# Patient Record
Sex: Female | Born: 1995 | Race: Black or African American | Hispanic: No | Marital: Single | Smoking: Never smoker
Health system: Southern US, Community
[De-identification: ages and names within clinical notes are randomized; demographics above are authoritative.]

## PROBLEM LIST (undated history)

## (undated) ENCOUNTER — Inpatient Hospital Stay (HOSPITAL_COMMUNITY): Payer: Self-pay

## (undated) DIAGNOSIS — A3790 Whooping cough, unspecified species without pneumonia: Secondary | ICD-10-CM

## (undated) DIAGNOSIS — N390 Urinary tract infection, site not specified: Secondary | ICD-10-CM

## (undated) DIAGNOSIS — E039 Hypothyroidism, unspecified: Secondary | ICD-10-CM

## (undated) DIAGNOSIS — N926 Irregular menstruation, unspecified: Secondary | ICD-10-CM

## (undated) DIAGNOSIS — D649 Anemia, unspecified: Secondary | ICD-10-CM

## (undated) HISTORY — DX: Whooping cough, unspecified species without pneumonia: A37.90

## (undated) HISTORY — PX: NO PAST SURGERIES: SHX2092

---
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
## 2015-01-06 ENCOUNTER — Telehealth: Payer: Self-pay | Admitting: Family Medicine

## 2015-01-06 NOTE — Telephone Encounter (Signed)
Spoke with patient regarding urine symptoms.  Advised patient that she should be seen to make sure she did not have a UTI.  Offered an appointment for tomorrow, but declined, patient is going out of town early in the morning.  No appointments at [facility] today.  Advised patient to go to one of the urgent care center for evaluation.  Patient stated understanding.  Clovis Pu, RN

## 2015-01-06 NOTE — Telephone Encounter (Signed)
Urine is very dark and the odor is strong, pt drinks nothing but water everyday. Patient is pregnant and would like to know if she should come in for this or not. Erin Wilkins, ASA

## 2015-01-12 ENCOUNTER — Encounter: Payer: Self-pay | Admitting: Family Medicine

## 2015-01-14 ENCOUNTER — Other Ambulatory Visit (HOSPITAL_COMMUNITY)
Admission: RE | Admit: 2015-01-14 | Discharge: 2015-01-14 | Disposition: A | Discharge status: Home / Self Care | Payer: Medicaid Other | Source: Ambulatory Visit | Attending: Family Medicine | Admitting: Family Medicine

## 2015-01-14 ENCOUNTER — Encounter: Payer: Self-pay | Admitting: Internal Medicine

## 2015-01-14 ENCOUNTER — Ambulatory Visit (INDEPENDENT_AMBULATORY_CARE_PROVIDER_SITE_OTHER): Payer: Medicaid Other | Admitting: Internal Medicine

## 2015-01-14 VITALS — BP 107/72 | HR 113 | Temp 98.4°F | Wt 115.4 lb

## 2015-01-14 DIAGNOSIS — N342 Other urethritis: Secondary | ICD-10-CM | POA: Diagnosis not present

## 2015-01-14 DIAGNOSIS — N390 Urinary tract infection, site not specified: Secondary | ICD-10-CM | POA: Diagnosis not present

## 2015-01-14 DIAGNOSIS — Z113 Encounter for screening for infections with a predominantly sexual mode of transmission: Secondary | ICD-10-CM | POA: Insufficient documentation

## 2015-01-14 DIAGNOSIS — Z331 Pregnant state, incidental: Secondary | ICD-10-CM | POA: Diagnosis not present

## 2015-01-14 DIAGNOSIS — Z3490 Encounter for supervision of normal pregnancy, unspecified, unspecified trimester: Secondary | ICD-10-CM

## 2015-01-14 LAB — POCT URINALYSIS DIPSTICK
Bilirubin, UA: NEGATIVE
Glucose, UA: NEGATIVE
Ketones, UA: NEGATIVE
NITRITE UA: NEGATIVE
PH UA: 7
PROTEIN UA: NEGATIVE
RBC UA: NEGATIVE
Spec Grav, UA: 1.025
UROBILINOGEN UA: 0.2

## 2015-01-14 LAB — POCT UA - MICROSCOPIC ONLY

## 2015-01-14 MED ORDER — FOSFOMYCIN TROMETHAMINE 3 G PO PACK: 3.0000 g | PACK | Freq: Once | ORAL | Status: DC

## 2015-01-14 NOTE — Progress Notes (Signed)
Patient ID: Erin Wilkins, female   DOB: Apr 11, 1996, 19 y.o.   MRN: [id number]     [name] Noralee Chars, MD Phone: [phone]  Subjective:   # Patient 31 weeks and 6 days, called on 9/1 with UTI complaints, at that time refused appointment and did not go to urgent care as recommended. Has continued to have symptoms over the past week. Feels that her bladder feels similar to her past UTIs. No dysuria. Dark urine, does not smell. Unsure about change in frequency, definitely feels like increased urgency. Has noted having flanking pain for about 3 weeks, however not associated with the change in urination. No fever, chills, nausea, or vomiting. No vaginal irritation. Sexually active,with one partner, would be amenable for G/C testing. Patient has had amoxicillin/pencilin in past causing hives of the face.  All relevant systems were reviewed and were negative unless otherwise noted in the HPI  Past Medical History Reviewed problem list.  Medications- reviewed and updated Current Outpatient Prescriptions  Medication Sig Dispense Refill  . carboxymethylcellulose (LUBRICATING PLUS EYE DROPS) 0.5 % SOLN Place 1 drop into the left eye 3 (three) times daily as needed. 15 mL 0  . fosfomycin (MONUROL) 3 G PACK Take 3 g by mouth once. 3 g 0  . olopatadine (PATANOL) 0.1 % ophthalmic solution Place 1 drop into the left eye 2 (two) times daily. 5 mL 0  . Prenatal Vit-Fe Fumarate-FA (PRENATAL VITAMIN) 27-0.8 MG TABS Take 1 tablet by mouth daily. 30 tablet 11  . Prenatal Vit-Min-FA-Fish Oil ([facility] PRENATAL GUMMY) 0.4-113.5 MG CHEW Chew 1 tablet by mouth daily. 30 tablet 11  . terconazole (TERAZOL 7) 0.4 % vaginal cream Place 1 applicator vaginally at bedtime. For 7 days. 45 g 0  . trimethoprim-polymyxin b (POLYTRIM) ophthalmic solution Place 1 drop into both eyes every 4 (four) hours. x5 days 10 mL 0  . [DISCONTINUED] clindamycin (CLINDAGEL) 1 % gel APPLY TO FACE DAILY AS DIRECTED 60 g 2    No current facility-administered medications for this visit.   Chief complaint-noted No additions to family history Social history- patient is a non-smoker  Objective: BP 107/72 mmHg  Pulse 113  Temp(Src) 98.4 F (36.9 C) (Oral)  Wt 115 lb 6 oz (52.334 kg)  LMP 05/31/2014 (Approximate) Gen: NAD, alert, cooperative with exam, not ill appearing  HEENT: NCAT, EOMI, PERRL, TMs nml Neck: FROM, supple CV: RRR, good S1/S2, no murmur, cap refill <3 Resp: CTABL, no wheezes, non-labored Abd: [place] female, non-tender to palpation of suprapubic area, no other abdominal tenderness. No CVA tenderness.  Neuro: Alert and oriented, No gross defici ts Skin: no rashes no lesions  Assessment/Plan: See problem based a/p  UTI in pregnancy. No dysuria, urine darkened in color despite hydration, increased urgency. Unlikely pyelonephritis negative fever, chills, n/v or CVA tenderness.  - UA with micr and urine culture  - Treat with fosfomycin x 1, received this in the past.  - Urine G/C  - Mom wanting patient to get another U/S, discussed concerns with mom and when we would typically consider getting another U/S

## 2015-01-14 NOTE — Addendum Note (Signed)
Addended by: Noralee Chars Z on: 01/14/2015 01:37 PM   Modules accepted: Orders

## 2015-01-14 NOTE — Assessment & Plan Note (Signed)
UTI in pregnancy. No dysuria, urine darkened in color despite hydration, increased urgency. Unlikely pyelonephritis negative fever, chills, n/v or CVA tenderness.  - UA with micr and urine culture  - Treat with fosfomycin x 1, received this in the past.  - Urine G/C  - Mom wanting patient to get another U/S, discussed concerns with mom and when we would typically consider getting another U/S

## 2015-01-14 NOTE — Patient Instructions (Addendum)
Patient provided with fosfomycin x 1 for UTI. Please return as discussed below   Pregnancy and Urinary Tract Infection A urinary tract infection (UTI) is a bacterial infection of the urinary tract. Infection of the urinary tract can include the ureters, kidneys (pyelonephritis), bladder (cystitis), and urethra (urethritis). All pregnant women should be screened for bacteria in the urinary tract. Identifying and treating a UTI will decrease the risk of preterm labor and developing more serious infections in both the mother and baby. CAUSES Bacteria germs cause almost all UTIs.  RISK FACTORS Many factors can increase your chances of getting a UTI during pregnancy. These include:  Having a short urethra.  Poor toilet and hygiene habits.  Sexual intercourse.  Blockage of urine along the urinary tract.  Problems with the pelvic muscles or nerves.  Diabetes.  Obesity.  Bladder problems after having several children.  Previous history of UTI. SIGNS AND SYMPTOMS   Pain, burning, or a stinging feeling when urinating.  Suddenly feeling the need to urinate right away (urgency).  Loss of bladder control (urinary incontinence).  Frequent urination, more than is common with pregnancy.  Lower abdominal or back discomfort.  Cloudy urine.  Blood in the urine (hematuria).  Fever. When the kidneys are infected, the symptoms may be:  Back pain.  Flank pain on the right side more so than the left.  Fever.  Chills.  Nausea.  Vomiting. DIAGNOSIS  A urinary tract infection is usually diagnosed through urine tests. Additional tests and procedures are sometimes done. These may include:  Ultrasound exam of the kidneys, ureters, bladder, and urethra.  Looking in the bladder with a lighted tube (cystoscopy). TREATMENT Typically, UTIs can be treated with antibiotic medicines.  HOME CARE INSTRUCTIONS   Only take over-the-counter or prescription medicines as directed by your health  care provider. If you were prescribed antibiotics, take them as directed. Finish them even if you start to feel better.  Drink enough fluids to keep your urine clear or pale yellow.  Do not have sexual intercourse until the infection is gone and your health care provider says it is okay.  Make sure you are tested for UTIs throughout your pregnancy. These infections often come back. Preventing a UTI in the Future  Practice good toilet habits. Always wipe from front to back. Use the tissue only once.  Do not hold your urine. Empty your bladder as soon as possible when the urge comes.  Do not douche or use deodorant sprays.  Wash with soap and warm water around the genital area and the anus.  Empty your bladder before and after sexual intercourse.  Wear underwear with a cotton crotch.  Avoid caffeine and carbonated drinks. They can irritate the bladder.  Drink cranberry juice or take cranberry pills. This may decrease the risk of getting a UTI.  Do not drink alcohol.  Keep all your appointments and tests as scheduled. SEEK MEDICAL CARE IF:   Your symptoms get worse.  You are still having fevers 2 or more days after treatment begins.  You have a rash.  You feel that you are having problems with medicines prescribed.  You have abnormal vaginal discharge. SEEK IMMEDIATE MEDICAL CARE IF:   You have back or flank pain.  You have chills.  You have blood in your urine.  You have nausea and vomiting.  You have contractions of your uterus.  You have a gush of fluid from the vagina. MAKE SURE YOU:  Understand these instructions.   Will  watch your condition.   Will get help right away if you are not doing well or get worse.  Document Released: 08/18/2010 Document Revised: 02/11/2013 Document Reviewed: 11/20/2012 [facility] Patient Information 2015 [place], [place]. This information is not intended to replace advice given to you by your health care provider. Make sure  you discuss any questions you have with your health care provider.

## 2015-01-17 NOTE — Addendum Note (Signed)
Addended by: Jennette Bill on: 01/17/2015 02:05 PM   Modules accepted: Orders

## 2015-01-18 LAB — URINE CYTOLOGY ANCILLARY ONLY
CHLAMYDIA, DNA PROBE: NEGATIVE
Neisseria Gonorrhea: NEGATIVE

## 2015-01-19 ENCOUNTER — Encounter: Payer: Self-pay | Admitting: Internal Medicine

## 2015-01-27 ENCOUNTER — Encounter (HOSPITAL_COMMUNITY): Payer: Self-pay | Admitting: *Deleted

## 2015-01-27 ENCOUNTER — Ambulatory Visit (INDEPENDENT_AMBULATORY_CARE_PROVIDER_SITE_OTHER): Payer: Medicaid Other | Admitting: Family Medicine

## 2015-01-27 ENCOUNTER — Inpatient Hospital Stay (HOSPITAL_COMMUNITY)
Admission: AD | Admit: 2015-01-27 | Discharge: 2015-01-27 | Disposition: A | Discharge status: Home / Self Care | Payer: Medicaid Other | Source: Ambulatory Visit | Attending: Obstetrics & Gynecology | Admitting: Obstetrics & Gynecology

## 2015-01-27 VITALS — BP 111/73 | HR 86 | Temp 98.5°F | Wt 118.5 lb

## 2015-01-27 DIAGNOSIS — R202 Paresthesia of skin: Secondary | ICD-10-CM | POA: Diagnosis not present

## 2015-01-27 DIAGNOSIS — O26893 Other specified pregnancy related conditions, third trimester: Secondary | ICD-10-CM | POA: Diagnosis not present

## 2015-01-27 DIAGNOSIS — R42 Dizziness and giddiness: Secondary | ICD-10-CM | POA: Diagnosis not present

## 2015-01-27 DIAGNOSIS — Z3A33 33 weeks gestation of pregnancy: Secondary | ICD-10-CM | POA: Insufficient documentation

## 2015-01-27 DIAGNOSIS — R55 Syncope and collapse: Secondary | ICD-10-CM | POA: Diagnosis present

## 2015-01-27 LAB — URINALYSIS, ROUTINE W REFLEX MICROSCOPIC
Bilirubin Urine: NEGATIVE
GLUCOSE, UA: NEGATIVE mg/dL
HGB URINE DIPSTICK: NEGATIVE
Ketones, ur: 15 mg/dL — AB
Nitrite: NEGATIVE
PH: 6.5 (ref 5.0–8.0)
Protein, ur: NEGATIVE mg/dL
SPECIFIC GRAVITY, URINE: 1.02 (ref 1.005–1.030)
Urobilinogen, UA: 1 mg/dL (ref 0.0–1.0)

## 2015-01-27 LAB — BASIC METABOLIC PANEL
Anion gap: 5 (ref 5–15)
BUN: 11 mg/dL (ref 6–20)
CHLORIDE: 108 mmol/L (ref 101–111)
CO2: 22 mmol/L (ref 22–32)
CREATININE: 0.43 mg/dL — AB (ref 0.44–1.00)
Calcium: 8.4 mg/dL — ABNORMAL LOW (ref 8.9–10.3)
GFR calc Af Amer: >60 mL/min (ref >60)
GFR calc non Af Amer: >60 mL/min (ref >60)
GLUCOSE: 91 mg/dL (ref 65–99)
POTASSIUM: 3.5 mmol/L (ref 3.5–5.1)
Sodium: 135 mmol/L (ref 135–145)

## 2015-01-27 LAB — URINE MICROSCOPIC-ADD ON

## 2015-01-27 LAB — CBC
HCT: 25.2 % — ABNORMAL LOW (ref 36.0–46.0)
Hemoglobin: 8.5 g/dL — ABNORMAL LOW (ref 12.0–15.0)
MCH: 30 pg (ref 26.0–34.0)
MCHC: 33.7 g/dL (ref 30.0–36.0)
MCV: 89 fL (ref 78.0–100.0)
PLATELETS: 254 10*3/uL (ref 150–400)
RBC: 2.83 MIL/uL — ABNORMAL LOW (ref 3.87–5.11)
RDW: 14.8 % (ref 11.5–15.5)
WBC: 9.6 10*3/uL (ref 4.0–10.5)

## 2015-01-27 LAB — POCT HEMOGLOBIN: Hemoglobin: 7.8 g/dL — AB (ref 12.2–16.2)

## 2015-01-27 MED ORDER — FERROUS SULFATE 325 (65 FE) MG PO TABS: 325.0000 mg | ORAL_TABLET | Freq: Every day | ORAL | Status: DC

## 2015-01-27 MED ORDER — DOCUSATE SODIUM 100 MG PO CAPS: 100.0000 mg | ORAL_CAPSULE | Freq: Two times a day (BID) | ORAL | Status: DC

## 2015-01-27 NOTE — Progress Notes (Signed)
Erin Wilkins is a 19 y.o. G1P0 at [redacted]w[redacted]d for routine follow up.  Pre-syncope: She reports feeling faint two days ago. Felt really hot, then felt like she needed to sit down. Similar episodes have happened 4 times over the past couple weeks. Has not passed out. No palpitations. No chest pain or shortness of breath. Endorses drinking plenty of fluid. Occurs more when hot outside.   See flow sheet for details.  A/P: Pregnancy at [redacted]w[redacted]d.  Doing well.   Pregnancy issues include dizziness.  Infant feeding choice: Wants to do both.  Contraception choice: Wants to try depo Infant circumcision desired yes  Tdapwas not given today. GBS/GC/CZ testing was not performed today.  Preterm labor precautions reviewed. Safe sleep discussed. Kick counts reviewed. Follow up 2 weeks.  Pre-syncope:  Likely a combination of dehydration and anemia. Also possibly has a vaso-vagal component given preceding symptoms. POCT Hgb in office today 7.0. Additionally patient with positive orthostatic vital signs, and thus likely hemoconcentrated. Will send to MAU for CBC, BMET, and possibly administration of fluids and/or transfusion if needed based on CBC result.

## 2015-01-27 NOTE — MAU Provider Note (Signed)
History     CSN: [id number]  Arrival date and time: 01/27/15 1249   First Kyen Taite Initiated Contact with Patient 01/27/15 1318      Chief Complaint  Patient presents with  . Near Syncope   HPI  Patient is 19 y.o. G1P0 [redacted]w[redacted]d here per MD recommendation for Hgb 7.  Erin Wilkins was seen earlier today at [facility] for a regular prenatal visit, and was found to have Hgb of 7. She is not reporting dizziness or other symptoms today, but does report dizziness when standing two days ago. She had a similar episode two weeks ago. She reports that she was told she was dehydrated while at clinic today, and says she has only had one glass of water today, and two small bottles of water yesterday.  Denies hematuria, hematochezia, nosebleeds, vaginal bleeding. Denies dysuria or increased urinary frequency.  +FM, denies LOF, contractions, vaginal discharge.   Past Medical History  Diagnosis Date  . UTI (urinary tract infection)   . Pertussis 03/18/2012    History reviewed. No pertinent past surgical history.  History reviewed. No pertinent family history.  Social History  Substance Use Topics  . Smoking status: Never Smoker   . Smokeless tobacco: None  . Alcohol Use: No    Allergies:  Allergies  Allergen Reactions  . Amoxicillin Rash  . Penicillins Rash    Has patient had a PCN reaction causing immediate rash, facial/tongue/throat swelling, SOB or lightheadedness with hypotension: No Has patient had a PCN reaction causing severe rash involving mucus membranes or skin necrosis: No Has patient had a PCN reaction that required hospitalization No Has patient had a PCN reaction occurring within the last 10 years: No If all of the above answers are "NO", then may proceed with Cephalosporin use.     Prescriptions prior to admission  Medication Sig Dispense Refill Last Dose  . Prenatal Vit-Min-FA-Fish Oil ([facility] PRENATAL GUMMY) 0.4-113.5 MG CHEW Chew 1 tablet by mouth daily. 30 tablet 11 01/26/2015  at Unknown time  . carboxymethylcellulose (LUBRICATING PLUS EYE DROPS) 0.5 % SOLN Place 1 drop into the left eye 3 (three) times daily as needed. (Patient not taking: Reported on 01/27/2015) 15 mL 0   . fosfomycin (MONUROL) 3 G PACK Take 3 g by mouth once. (Patient not taking: Reported on 01/27/2015) 3 g 0   . olopatadine (PATANOL) 0.1 % ophthalmic solution Place 1 drop into the left eye 2 (two) times daily. (Patient not taking: Reported on 01/27/2015) 5 mL 0   . Prenatal Vit-Fe Fumarate-FA (PRENATAL VITAMIN) 27-0.8 MG TABS Take 1 tablet by mouth daily. (Patient not taking: Reported on 01/27/2015) 30 tablet 11 Taking  . terconazole (TERAZOL 7) 0.4 % vaginal cream Place 1 applicator vaginally at bedtime. For 7 days. (Patient not taking: Reported on 01/27/2015) 45 g 0   . trimethoprim-polymyxin b (POLYTRIM) ophthalmic solution Place 1 drop into both eyes every 4 (four) hours. x5 days (Patient not taking: Reported on 01/27/2015) 10 mL 0     Review of Systems  HENT: Negative for nosebleeds.   Gastrointestinal: Negative for blood in stool.  Genitourinary: Negative for hematuria.  Neurological: Positive for dizziness and tingling.   Physical Exam   Blood pressure 116/65, pulse 98, temperature 98.8 F (37.1 C), temperature source Oral, resp. rate 16, height 5' 1.61" (1.565 m), weight 120 lb 9.6 oz (54.704 kg), last menstrual period 05/31/2014, SpO2 100 %.  Physical Exam  Nursing note and vitals reviewed. Constitutional: She is oriented to person,  place, and time. She appears well-developed and well-nourished. No distress.  HENT:  Head: Normocephalic and atraumatic.  Eyes:  No conjunctival pallor bilaterally  Cardiovascular: Normal rate.   Respiratory: Effort normal. No respiratory distress.  GI: Soft. She exhibits no distension. There is no tenderness. There is no rebound and no guarding.  Gravid   Musculoskeletal: She exhibits no edema or tenderness.  Neurological: She is alert and oriented to  person, place, and time. No cranial nerve deficit.  Skin: Skin is warm and dry.  Psychiatric: She has a normal mood and affect. Her behavior is normal.    MAU Course  Procedures None  MDM CBC - Hgb 8.5 UA - few bacteria BMP - unremarkable  Assessment and Plan  A: Patient is 19 y.o. G1P0 [redacted]w[redacted]d here per MD recommendation for Hgb 7. Given repeat Hgb of 8.5 and lack of symptoms, will not admit today.   P: Discharge home - Reviewed findings and my conclusion - Handout given - Follow-up with OB Erin Wilkins - Prescribed iron and Colace - Will sent urine for culture given presence of bacteria. Will contact patient if need for treatment.    Tarri Abernethy, MD PGY-1 [name]  01/27/2015, 3:07 PM

## 2015-01-27 NOTE — MAU Note (Signed)
Patient was seen at [facility] and states Hgb was 7.  She states she had felt a little lightheaded a few days ago.  Patient was sent over to MAU for blood work and possible IV iron infusion.

## 2015-01-27 NOTE — Discharge Instructions (Signed)
Premature Rupture and Preterm Premature Rupture of Membranes A sac made up of membranes surrounds your baby in the womb (uterus). When this sac breaks before contractions or labor starts, it is called premature rupture of membranes (PROM). Rupture of membranes is also known as your water breaking. If this happens before 37 weeks, it is called preterm premature rupture of membranes (PPROM). PPROM is serious. It needs medical care right away. CAUSES  PROM may be caused by the membranes getting weak. This happens at the end of pregnancy. PPROM is often due to an infection, but can be caused by a number of other things.  SIGNS OF PROM OR PPROM  A sudden gush of fluid from the vagina.  A slow leak of fluid from the vagina.  Your underwear stay wet. WHAT TO DO IF YOU THINK YOUR WATER BROKE Call your doctor right away. You will need to go to the hospital to get checked right away. WHAT HAPPENS IF YOU ARE TOLD YOU HAVE PROM OR PPROM? You will have tests done at the hospital. If you have PROM, you may be given medicine to start labor (induced). This may happen if you are not having contractions within 24 hours of your water breaking. If you have PPROM and are not having contractions, you may be given medicine to start labor. It will depend on how far along you are in your pregnancy. If you have PPROM, you:  And your baby will be watched closely for signs of infection or other problems.  May be given an antibiotic medicine. This can stop an infection from starting.  May be given a steroid medicine. This can help the lungs to develop faster.  May be given a medicine to stop early labor (preterm labor).  May be told to stay in bed except to use the restroom (bed rest).  May be given medicine to start labor. This can happen if there are problems with you or the baby. Your treatment will depend on many factors. Document Released: 07/20/2008 Document Revised: 12/24/2012 Document Reviewed:  08/12/2012 [facility] Patient Information 2015 [place], [place]. This information is not intended to replace advice given to you by your health care provider. Make sure you discuss any questions you have with your health care provider. Pregnancy and Anemia Anemia is a condition in which the concentration of red blood cells or hemoglobin in the blood is below normal. Hemoglobin is a substance in red blood cells that carries oxygen to the tissues of the body. Anemia results in not enough oxygen reaching these tissues.  Anemia during pregnancy is common because the fetus uses more iron and folic acid as it is developing. Your body may not produce enough red blood cells because of this. Also, during pregnancy, the liquid part of the blood (plasma) increases by about 50%, and the red blood cells increase by only 25%. This lowers the concentration of the red blood cells and creates a natural anemia-like situation.  CAUSES  The most common cause of anemia during pregnancy is not having enough iron in the body to make red blood cells (iron deficiency anemia). Other causes may include:  Folic acid deficiency.  Vitamin B12 deficiency.  Certain prescription or over-the-counter medicines.  Certain medical conditions or infections that destroy red blood cells.  A low platelet count and bleeding caused by antibodies that go through the placenta to the fetus from the mother's blood. SIGNS AND SYMPTOMS  Mild anemia may not be noticeable. If it becomes severe, symptoms may  include:  Tiredness.  Shortness of breath, especially with exercise.  Weakness.  Fainting.  Pale looking skin.  Headaches.  Feeling a fast or irregular heartbeat (palpitations). DIAGNOSIS  The type of anemia is usually diagnosed from your family and medical history and blood tests. TREATMENT  Treatment of anemia during pregnancy depends on the cause of the anemia. Treatment can include:  Supplements of iron, vitamin B12, or folic  acid.  A blood transfusion. This may be needed if blood loss is severe.  Hospitalization. This may be needed if there is significant continual blood loss.  Dietary changes. HOME CARE INSTRUCTIONS   Follow your dietitian's or health care provider's dietary recommendations.  Increase your vitamin C intake. This will help the stomach absorb more iron.  Eat a diet rich in iron. This would include foods such as:  Liver.  Beef.  Whole grain bread.  Eggs.  Dried fruit.  Take iron and vitamins as directed by your health care provider.  Eat green leafy vegetables. These are a good source of folic acid. SEEK MEDICAL CARE IF:   You have frequent or lasting headaches.  You are looking pale.  You are bruising easily. SEEK IMMEDIATE MEDICAL CARE IF:   You have extreme weakness, shortness of breath, or chest pain.  You become dizzy or have trouble concentrating.  You have heavy vaginal bleeding.  You develop a rash.  You have bloody or black, tarry stools.  You faint.  You vomit up blood.  You vomit repeatedly.  You have abdominal pain.  You have a fever or persistent symptoms for more than 2-3 days.  You have a fever and your symptoms suddenly get worse.  You are dehydrated. MAKE SURE YOU:   Understand these instructions.  Will watch your condition.  Will get help right away if you are not doing well or get worse. Document Released: 04/20/2000 Document Revised: 02/11/2013 Document Reviewed: 12/03/2012 [facility] Patient Information 2015 [place], [place]. This information is not intended to replace advice given to you by your health care provider. Make sure you discuss any questions you have with your health care provider.

## 2015-01-27 NOTE — Patient Instructions (Signed)
Third Trimester of Pregnancy The third trimester is from week 29 through week 42, months 7 through 9. The third trimester is a time when the fetus is growing rapidly. At the end of the ninth month, the fetus is about 20 inches in length and weighs 6-10 pounds.  BODY CHANGES Your body goes through many changes during pregnancy. The changes vary from woman to woman.   Your weight will continue to increase. You can expect to gain 25-35 pounds (11-16 kg) by the end of the pregnancy.  You may begin to get stretch marks on your hips, abdomen, and breasts.  You may urinate more often because the fetus is moving lower into your pelvis and pressing on your bladder.  You may develop or continue to have heartburn as a result of your pregnancy.  You may develop constipation because certain hormones are causing the muscles that push waste through your intestines to slow down.  You may develop hemorrhoids or swollen, bulging veins (varicose veins).  You may have pelvic pain because of the weight gain and pregnancy hormones relaxing your joints between the bones in your pelvis. Backaches may result from overexertion of the muscles supporting your posture.  You may have changes in your hair. These can include thickening of your hair, rapid growth, and changes in texture. Some women also have hair loss during or after pregnancy, or hair that feels dry or thin. Your hair will most likely return to normal after your baby is born.  Your breasts will continue to grow and be tender. A yellow discharge may leak from your breasts called colostrum.  Your belly button may stick out.  You may feel short of breath because of your expanding uterus.  You may notice the fetus "dropping," or moving lower in your abdomen.  You may have a bloody mucus discharge. This usually occurs a few days to a week before labor begins.  Your cervix becomes thin and soft (effaced) near your due date. WHAT TO EXPECT AT YOUR PRENATAL  EXAMS  You will have prenatal exams every 2 weeks until week 36. Then, you will have weekly prenatal exams. During a routine prenatal visit:  You will be weighed to make sure you and the fetus are growing normally.  Your blood pressure is taken.  Your abdomen will be measured to track your baby's growth.  The fetal heartbeat will be listened to.  Any test results from the previous visit will be discussed.  You may have a cervical check near your due date to see if you have effaced. At around 36 weeks, your caregiver will check your cervix. At the same time, your caregiver will also perform a test on the secretions of the vaginal tissue. This test is to determine if a type of bacteria, Group B streptococcus, is present. Your caregiver will explain this further. Your caregiver may ask you:  What your birth plan is.  How you are feeling.  If you are feeling the baby move.  If you have had any abnormal symptoms, such as leaking fluid, bleeding, severe headaches, or abdominal cramping.  If you have any questions. Other tests or screenings that may be performed during your third trimester include:  Blood tests that check for low iron levels (anemia).  Fetal testing to check the health, activity level, and growth of the fetus. Testing is done if you have certain medical conditions or if there are problems during the pregnancy. FALSE LABOR You may feel small, irregular contractions that   eventually go away. These are called Braxton Hicks contractions, or false labor. Contractions may last for hours, days, or even weeks before true labor sets in. If contractions come at regular intervals, intensify, or become painful, it is best to be seen by your caregiver.  SIGNS OF LABOR   Menstrual-like cramps.  Contractions that are 5 minutes apart or less.  Contractions that start on the top of the uterus and spread down to the lower abdomen and back.  A sense of increased pelvic pressure or back  pain.  A watery or bloody mucus discharge that comes from the vagina. If you have any of these signs before the 37th week of pregnancy, call your caregiver right away. You need to go to the hospital to get checked immediately. HOME CARE INSTRUCTIONS   Avoid all smoking, herbs, alcohol, and unprescribed drugs. These chemicals affect the formation and growth of the baby.  Follow your caregiver's instructions regarding medicine use. There are medicines that are either safe or unsafe to take during pregnancy.  Exercise only as directed by your caregiver. Experiencing uterine cramps is a good sign to stop exercising.  Continue to eat regular, healthy meals.  Wear a good support bra for breast tenderness.  Do not use hot tubs, steam rooms, or saunas.  Wear your seat belt at all times when driving.  Avoid raw meat, uncooked cheese, cat litter boxes, and soil used by cats. These carry germs that can cause birth defects in the baby.  Take your prenatal vitamins.  Try taking a stool softener (if your caregiver approves) if you develop constipation. Eat more high-fiber foods, such as fresh vegetables or fruit and whole grains. Drink plenty of fluids to keep your urine clear or pale yellow.  Take warm sitz baths to soothe any pain or discomfort caused by hemorrhoids. Use hemorrhoid cream if your caregiver approves.  If you develop varicose veins, wear support hose. Elevate your feet for 15 minutes, 3-4 times a day. Limit salt in your diet.  Avoid heavy lifting, wear low heal shoes, and practice good posture.  Rest a lot with your legs elevated if you have leg cramps or low back pain.  Visit your dentist if you have not gone during your pregnancy. Use a soft toothbrush to brush your teeth and be gentle when you floss.  A sexual relationship may be continued unless your caregiver directs you otherwise.  Do not travel far distances unless it is absolutely necessary and only with the approval  of your caregiver.  Take prenatal classes to understand, practice, and ask questions about the labor and delivery.  Make a trial run to the hospital.  Pack your hospital bag.  Prepare the baby's nursery.  Continue to go to all your prenatal visits as directed by your caregiver. SEEK MEDICAL CARE IF:  You are unsure if you are in labor or if your water has broken.  You have dizziness.  You have mild pelvic cramps, pelvic pressure, or nagging pain in your abdominal area.  You have persistent nausea, vomiting, or diarrhea.  You have a bad smelling vaginal discharge.  You have pain with urination. SEEK IMMEDIATE MEDICAL CARE IF:   You have a fever.  You are leaking fluid from your vagina.  You have spotting or bleeding from your vagina.  You have severe abdominal cramping or pain.  You have rapid weight loss or gain.  You have shortness of breath with chest pain.  You notice sudden or extreme swelling   of your face, hands, ankles, feet, or legs.  You have not felt your baby move in over an hour.  You have severe headaches that do not go away with medicine.  You have vision changes. Document Released: 04/17/2001 Document Revised: 04/28/2013 Document Reviewed: 06/24/2012 [employer] Patient Information 2015 [employer]. This information is not intended to replace advice given to you by your health care provider. Make sure you discuss any questions you have with your health care provider.  

## 2015-01-28 LAB — CULTURE, OB URINE
CULTURE: NO GROWTH
SPECIAL REQUESTS: NORMAL

## 2015-02-01 ENCOUNTER — Telehealth: Payer: Self-pay | Admitting: Family Medicine

## 2015-02-01 NOTE — Telephone Encounter (Signed)
Will forward to MD.  Per discharge notes it stated that patient would be contacted if treatment was necessary.  Jazmin Hartsell,CMA

## 2015-02-01 NOTE — Telephone Encounter (Signed)
Patient's urine culture was negative. She does not need any treatment.  Erin Wilkins. Jimmey Ralph, MD [facility] Resident PGY-2 02/01/2015 8:59 PM

## 2015-02-01 NOTE — Telephone Encounter (Signed)
Bacteria was found in urine last week at the hospital. What does she need to do?   appt is tues oct 4

## 2015-02-02 NOTE — Telephone Encounter (Signed)
LM for patient to call back. Jazmin Hartsell,CMA  

## 2015-02-03 ENCOUNTER — Ambulatory Visit (INDEPENDENT_AMBULATORY_CARE_PROVIDER_SITE_OTHER): Payer: Medicaid Other | Admitting: Family Medicine

## 2015-02-03 ENCOUNTER — Encounter: Payer: Self-pay | Admitting: Family Medicine

## 2015-02-03 VITALS — BP 115/65 | HR 98 | Temp 98.6°F | Ht 61.0 in | Wt 120.1 lb

## 2015-02-03 DIAGNOSIS — N390 Urinary tract infection, site not specified: Secondary | ICD-10-CM | POA: Diagnosis not present

## 2015-02-03 DIAGNOSIS — B373 Candidiasis of vulva and vagina: Secondary | ICD-10-CM

## 2015-02-03 DIAGNOSIS — N898 Other specified noninflammatory disorders of vagina: Secondary | ICD-10-CM | POA: Diagnosis present

## 2015-02-03 DIAGNOSIS — B3731 Acute candidiasis of vulva and vagina: Secondary | ICD-10-CM

## 2015-02-03 LAB — POCT URINALYSIS DIPSTICK
Bilirubin, UA: NEGATIVE
Glucose, UA: NEGATIVE
KETONES UA: 40
Nitrite, UA: NEGATIVE
PH UA: 7
PROTEIN UA: NEGATIVE
RBC UA: NEGATIVE
SPEC GRAV UA: 1.025
UROBILINOGEN UA: 1

## 2015-02-03 LAB — POCT WET PREP (WET MOUNT): CLUE CELLS WET PREP WHIFF POC: NEGATIVE

## 2015-02-03 LAB — POCT UA - MICROSCOPIC ONLY: WBC, Ur, HPF, POC: >20

## 2015-02-03 MED ORDER — FLUCONAZOLE 150 MG PO TABS: 150.0000 mg | ORAL_TABLET | Freq: Once | ORAL | Status: DC

## 2015-02-03 NOTE — Patient Instructions (Signed)
I think you have a yeast infection Take diflucan  once. Repeat in 3 days if not better  If you have any contractions which occur regularly, vaginal bleeding, fluid leaking, or are worried that baby is not moving well, go immediately to [facility] to be evaluated.   Be well, Dr. Pollie Meyer

## 2015-02-03 NOTE — Telephone Encounter (Signed)
Pt is being seen in clinic today (02/03/15) with Dr. Pollie Meyer for UTI sx. Adams,Latoya, CMA.

## 2015-02-03 NOTE — Progress Notes (Signed)
HPI:  Erin Wilkins is a 19 y.o. G1P0 at [redacted]w[redacted]d who presents for a same day appointment to discuss vaginal discharge.   Discharge began last week when she shaved and applied soap. Vaginal area now irritated. No itching, just discomfort. Has yellow/white discharge noted on underwear, which patient does not think is from her vagina.  No night sweats, fevers, chills. No dysuria. Urine is normal in color. No odor. Recent negative gc/chlamydia testing. No new sexual partners since then.  Denies fluid leaking, vaginal bleeding, or decreased fetal movement. Has had some mild contractions, they were regular yesterday but have not persisted since then.  ROS: See HPI  PMFSH: acne,   PHYSICAL EXAM: BP 115/65 mmHg  Pulse 98  Temp(Src) 98.6 F (37 C) (Oral)  Ht  (1.549 m)  Wt 120 lb 1.6 oz (54.477 kg)  BMI 22.70 kg/m2  LMP 05/31/2014 (Approximate)  FHR is 145bpm via transabdominal doppler Gen: NAD, pleasant, cooperative GU: normal appearing external genitalia without lesions. Vagina is moist with thick white discharge. Cervix normal in appearance. Cervix is 1.5cm dilated and thick. Vertex presentation.  ASSESSMENT/PLAN:  Candidal vulvovaginitis Symptoms and exam consistent with yeast vulvovaginitis. rx diflucan  x1, repeat in 3 days if not better.  No significant cervical dilation today. Discussed MAU precautions for labor and fetal movement.  Note - UA obtained by staff prior to taking history. Prior urine culture from 9/22 with no growth. Symptoms not consistent with UTI. Urinalysis not grossly positive.   FOLLOW UP: F/u as scheduled for next prenatal visit  [place] J. Pollie Meyer, MD [facility]

## 2015-02-04 NOTE — Assessment & Plan Note (Signed)
Symptoms and exam consistent with yeast vulvovaginitis. rx diflucan  x1, repeat in 3 days if not better.

## 2015-02-08 ENCOUNTER — Ambulatory Visit (INDEPENDENT_AMBULATORY_CARE_PROVIDER_SITE_OTHER): Payer: Medicaid Other | Admitting: Family Medicine

## 2015-02-08 ENCOUNTER — Other Ambulatory Visit (HOSPITAL_COMMUNITY)
Admission: RE | Admit: 2015-02-08 | Discharge: 2015-02-08 | Disposition: A | Discharge status: Home / Self Care | Payer: Medicaid Other | Source: Ambulatory Visit | Attending: Family Medicine | Admitting: Family Medicine

## 2015-02-08 VITALS — BP 118/72 | HR 88 | Temp 98.4°F | Wt 120.5 lb

## 2015-02-08 DIAGNOSIS — Z113 Encounter for screening for infections with a predominantly sexual mode of transmission: Secondary | ICD-10-CM

## 2015-02-08 DIAGNOSIS — Z3403 Encounter for supervision of normal first pregnancy, third trimester: Secondary | ICD-10-CM

## 2015-02-08 LAB — OB RESULTS CONSOLE GC/CHLAMYDIA: GC PROBE AMP, GENITAL: NEGATIVE

## 2015-02-08 NOTE — Progress Notes (Signed)
Erin Wilkins is a 19 y.o. G1P0 at [redacted]w[redacted]d for routine follow up.  She reports irregular contractions, started about 3 days ago. Feel like quick cramps. Last 2-3 minutes. Felt pressure for about 5 seconds. Still having some discharge. Will take second dose of diflucan today. Has been taking iron supplements.  See flow sheet for details.   Physical exam: Vitals reviewed. GU: Thick discharge noted. Friable cervix.  See flowsheet for further details.   A/P: Pregnancy at [redacted]w[redacted]d.  Doing well.   Pregnancy issues include: iron deficiency anemia, vaginal candidiasis  Irregular contractions likely Deberah Pelton Will take second dose of diflucan today for yeast infection. Consider intravaginal antifungal if still symptomatic.  Continue iron supplementation. GBS/GC/CZ testing was performed today.  Preterm labor precautions reviewed. Safe sleep discussed. Kick counts reviewed. Follow up 2 weeks.

## 2015-02-08 NOTE — Patient Instructions (Signed)
Braxton Hicks Contractions Contractions of the uterus can occur throughout pregnancy. Contractions are not always a sign that you are in labor.  WHAT ARE BRAXTON HICKS CONTRACTIONS?  Contractions that occur before labor are called Braxton Hicks contractions, or false labor. Toward the end of pregnancy (32-34 weeks), these contractions can develop more often and may become more forceful. This is not true labor because these contractions do not result in opening (dilatation) and thinning of the cervix. They are sometimes difficult to tell apart from true labor because these contractions can be forceful and people have different pain tolerances. You should not feel embarrassed if you go to the hospital with false labor. Sometimes, the only way to tell if you are in true labor is for your health care provider to look for changes in the cervix. If there are no prenatal problems or other health problems associated with the pregnancy, it is completely safe to be sent home with false labor and await the onset of true labor. HOW CAN YOU TELL THE DIFFERENCE BETWEEN TRUE AND FALSE LABOR? False Labor  The contractions of false labor are usually shorter and not as hard as those of true labor.   The contractions are usually irregular.   The contractions are often felt in the front of the lower abdomen and in the groin.   The contractions may go away when you walk around or change positions while lying down.   The contractions get weaker and are shorter lasting as time goes on.   The contractions do not usually become progressively stronger, regular, and closer together as with true labor.  True Labor  Contractions in true labor last 30-70 seconds, become very regular, usually become more intense, and increase in frequency.   The contractions do not go away with walking.   The discomfort is usually felt in the top of the uterus and spreads to the lower abdomen and low back.   True labor can be  determined by your health care provider with an exam. This will show that the cervix is dilating and getting thinner.  WHAT TO REMEMBER  Keep up with your usual exercises and follow other instructions given by your health care provider.   Take medicines as directed by your health care provider.   Keep your regular prenatal appointments.   Eat and drink lightly if you think you are going into labor.   If Braxton Hicks contractions are making you uncomfortable:   Change your position from lying down or resting to walking, or from walking to resting.   Sit and rest in a tub of warm water.   Drink 2-3 glasses of water. Dehydration may cause these contractions.   Do slow and deep breathing several times an hour.  WHEN SHOULD I SEEK IMMEDIATE MEDICAL CARE? Seek immediate medical care if:  Your contractions become stronger, more regular, and closer together.   You have fluid leaking or gushing from your vagina.   You have a fever.   You pass blood-tinged mucus.   You have vaginal bleeding.   You have continuous abdominal pain.   You have low back pain that you never had before.   You feel your baby's head pushing down and causing pelvic pressure.   Your baby is not moving as much as it used to.  Document Released: 04/23/2005 Document Revised: 04/28/2013 Document Reviewed: 02/02/2013 [employer] Patient Information 2015 [employer]. This information is not intended to replace advice given to you by your health care   provider. Make sure you discuss any questions you have with your health care provider. Vaginal Bleeding During Pregnancy, Third Trimester  A small amount of bleeding (spotting) from the vagina is common in pregnancy. Sometimes the bleeding is normal and is not a problem, and sometimes it is a sign of something serious. Be sure to tell your doctor about any bleeding from your vagina right away. HOME CARE  Watch your condition for any  changes.  Follow your doctor's instructions about how active you can be.  If you are on bed rest:  You may need to stay in bed and only get up to use the bathroom.  You may be allowed to do some activities.  If you need help, make plans for someone to help you.  Write down:  The number of pads you use each day.  How often you change pads.  How soaked (saturated) your pads are.  Do not use tampons.  Do not douche.  Do not have sex or orgasms until your doctor says it is okay.  Follow your doctor's advice about lifting, driving, and doing physical activities.  If you pass any tissue from your vagina, save the tissue so you can show it to your doctor.  Only take medicines as told by your doctor.  Do not take aspirin because it can make you bleed.  Keep all follow-up visits as told by your doctor. GET HELP IF:   You bleed from your vagina.  You have cramps.  You have labor pains.  You have a fever that does not go away after you take medicine. GET HELP RIGHT AWAY IF:  You have very bad cramps in your back or belly (abdomen).  You have chills.  You have a gush of fluid from your vagina.  You pass large clots or tissue from your vagina.  You bleed more.  You feel light-headed or weak.  You pass out (faint).  You do not feel your baby move around as much as before. MAKE SURE YOU:  Understand these instructions.  Will watch your condition.  Will get help right away if you are not doing well or get worse. Document Released: 09/07/2013 Document Reviewed: 12/29/2012 [facility] Patient Information 2015 [place], [place]. This information is not intended to replace advice given to you by your health care provider. Make sure you discuss any questions you have with your health care provider.

## 2015-02-09 LAB — GC/CHLAMYDIA PROBE AMP (~~LOC~~) NOT AT ARMC
CHLAMYDIA, DNA PROBE: NEGATIVE
NEISSERIA GONORRHEA: NEGATIVE

## 2015-02-09 LAB — CULTURE, BETA STREP (GROUP B ONLY)

## 2015-02-10 ENCOUNTER — Telehealth: Payer: Self-pay | Admitting: Family Medicine

## 2015-02-10 NOTE — Telephone Encounter (Signed)
Will forward to MD. Darren Nodal,CMA  

## 2015-02-10 NOTE — Telephone Encounter (Signed)
Pt called and needs a letter stating that at this time during the end of her pregnancy she has a lot of issues standing. She needs this to give to her employer that she can not stand all day. Please put up front. jw

## 2015-02-11 NOTE — Telephone Encounter (Signed)
Called patient. No answer. Letter printed and left at front of office. Will also inform patient of test results.  Erin Wilkins. Erin Ralph, MD [facility] Resident PGY-2 02/11/2015 5:30 PM

## 2015-02-14 NOTE — Telephone Encounter (Signed)
Advised pt as directed as below and verbalized understanding. Wanda Cellucci, CMA.

## 2015-02-17 ENCOUNTER — Encounter (HOSPITAL_COMMUNITY): Payer: Self-pay

## 2015-02-17 ENCOUNTER — Inpatient Hospital Stay (HOSPITAL_COMMUNITY)
Admission: AD | Admit: 2015-02-17 | Discharge: 2015-02-17 | Disposition: A | Discharge status: Home / Self Care | Payer: Medicaid Other | Source: Ambulatory Visit | Attending: Obstetrics & Gynecology | Admitting: Obstetrics & Gynecology

## 2015-02-17 DIAGNOSIS — O26893 Other specified pregnancy related conditions, third trimester: Secondary | ICD-10-CM | POA: Insufficient documentation

## 2015-02-17 DIAGNOSIS — O99820 Streptococcus B carrier state complicating pregnancy: Secondary | ICD-10-CM | POA: Diagnosis not present

## 2015-02-17 DIAGNOSIS — Z3A36 36 weeks gestation of pregnancy: Secondary | ICD-10-CM | POA: Insufficient documentation

## 2015-02-17 DIAGNOSIS — Z3403 Encounter for supervision of normal first pregnancy, third trimester: Secondary | ICD-10-CM

## 2015-02-17 HISTORY — DX: Anemia, unspecified: D64.9

## 2015-02-17 NOTE — Discharge Instructions (Signed)
lFetal Movement Counts Patient Name: __________________________________________________ Patient Due Date: ____________________ Performing a fetal movement count is highly recommended in high-risk pregnancies, but it is good for every pregnant woman to do. Your health care provider may ask you to start counting fetal movements at 28 weeks of the pregnancy. Fetal movements often increase:  After eating a full meal.  After physical activity.  After eating or drinking something sweet or cold.  At rest. Pay attention to when you feel the baby is most active. This will help you notice a pattern of your baby's sleep and wake cycles and what factors contribute to an increase in fetal movement. It is important to perform a fetal movement count at the same time each day when your baby is normally most active.  HOW TO COUNT FETAL MOVEMENTS 1. Find a quiet and comfortable area to sit or lie down on your left side. Lying on your left side provides the best blood and oxygen circulation to your baby. 2. Write down the day and time on a sheet of paper or in a journal. 3. Start counting kicks, flutters, swishes, rolls, or jabs in a 2-hour period. You should feel at least 10 movements within 2 hours. 4. If you do not feel 10 movements in 2 hours, wait 2-3 hours and count again. Look for a change in the pattern or not enough counts in 2 hours. SEEK MEDICAL CARE IF:  You feel less than 10 counts in 2 hours, tried twice.  There is no movement in over an hour.  The pattern is changing or taking longer each day to reach 10 counts in 2 hours.  You feel the baby is not moving as he or she usually does. Date: ____________ Movements: ____________ Start time: ____________ Doreatha MartinFinish time: ____________  Date: ____________ Movements: ____________ Start time: ____________ Doreatha MartinFinish time: ____________ Date: ____________ Movements: ____________ Start time: ____________ Doreatha MartinFinish time: ____________ Date: ____________ Movements:  ____________ Start time: ____________ Doreatha MartinFinish time: ____________ Date: ____________ Movements: ____________ Start time: ____________ Doreatha MartinFinish time: ____________ Date: ____________ Movements: ____________ Start time: ____________ Doreatha MartinFinish time: ____________ Date: ____________ Movements: ____________ Start time: ____________ Doreatha MartinFinish time: ____________ Date: ____________ Movements: ____________ Start time: ____________ Doreatha MartinFinish time: ____________  Date: ____________ Movements: ____________ Start time: ____________ Doreatha MartinFinish time: ____________ Date: ____________ Movements: ____________ Start time: ____________ Doreatha MartinFinish time: ____________ Date: ____________ Movements: ____________ Start time: ____________ Doreatha MartinFinish time: ____________ Date: ____________ Movements: ____________ Start time: ____________ Doreatha MartinFinish time: ____________ Date: ____________ Movements: ____________ Start time: ____________ Doreatha MartinFinish time: ____________ Date: ____________ Movements: ____________ Start time: ____________ Doreatha MartinFinish time: ____________ Date: ____________ Movements: ____________ Start time: ____________ Doreatha MartinFinish time: ____________  Date: ____________ Movements: ____________ Start time: ____________ Doreatha MartinFinish time: ____________ Date: ____________ Movements: ____________ Start time: ____________ Doreatha MartinFinish time: ____________ Date: ____________ Movements: ____________ Start time: ____________ Doreatha MartinFinish time: ____________ Date: ____________ Movements: ____________ Start time: ____________ Doreatha MartinFinish time: ____________ Date: ____________ Movements: ____________ Start time: ____________ Doreatha MartinFinish time: ____________ Date: ____________ Movements: ____________ Start time: ____________ Doreatha MartinFinish time: ____________ Date: ____________ Movements: ____________ Start time: ____________ Doreatha MartinFinish time: ____________  Date: ____________ Movements: ____________ Start time: ____________ Doreatha MartinFinish time: ____________ Date: ____________ Movements: ____________ Start time: ____________ Doreatha MartinFinish  time: ____________ Date: ____________ Movements: ____________ Start time: ____________ Doreatha MartinFinish time: ____________ Date: ____________ Movements: ____________ Start time: ____________ Doreatha MartinFinish time: ____________ Date: ____________ Movements: ____________ Start time: ____________ Doreatha MartinFinish time: ____________ Date: ____________ Movements: ____________ Start time: ____________ Doreatha MartinFinish time: ____________ Date: ____________ Movements: ____________ Start time: ____________ Doreatha MartinFinish time: ____________  Date: ____________ Movements: ____________ Start time: ____________ Doreatha MartinFinish  time: ____________ °Date: ____________ Movements: ____________ Start time: ____________ Finish time: ____________ °Date: ____________ Movements: ____________ Start time: ____________ Finish time: ____________ °Date: ____________ Movements: ____________ Start time: ____________ Finish time: ____________ °Date: ____________ Movements: ____________ Start time: ____________ Finish time: ____________ °Date: ____________ Movements: ____________ Start time: ____________ Finish time: ____________ °Date: ____________ Movements: ____________ Start time: ____________ Finish time: ____________  °Date: ____________ Movements: ____________ Start time: ____________ Finish time: ____________ °Date: ____________ Movements: ____________ Start time: ____________ Finish time: ____________ °Date: ____________ Movements: ____________ Start time: ____________ Finish time: ____________ °Date: ____________ Movements: ____________ Start time: ____________ Finish time: ____________ °Date: ____________ Movements: ____________ Start time: ____________ Finish time: ____________ °Date: ____________ Movements: ____________ Start time: ____________ Finish time: ____________ °Date: ____________ Movements: ____________ Start time: ____________ Finish time: ____________  °Date: ____________ Movements: ____________ Start time: ____________ Finish time: ____________ °Date: ____________  Movements: ____________ Start time: ____________ Finish time: ____________ °Date: ____________ Movements: ____________ Start time: ____________ Finish time: ____________ °Date: ____________ Movements: ____________ Start time: ____________ Finish time: ____________ °Date: ____________ Movements: ____________ Start time: ____________ Finish time: ____________ °Date: ____________ Movements: ____________ Start time: ____________ Finish time: ____________ °Date: ____________ Movements: ____________ Start time: ____________ Finish time: ____________  °Date: ____________ Movements: ____________ Start time: ____________ Finish time: ____________ °Date: ____________ Movements: ____________ Start time: ____________ Finish time: ____________ °Date: ____________ Movements: ____________ Start time: ____________ Finish time: ____________ °Date: ____________ Movements: ____________ Start time: ____________ Finish time: ____________ °Date: ____________ Movements: ____________ Start time: ____________ Finish time: ____________ °Date: ____________ Movements: ____________ Start time: ____________ Finish time: ____________ °  °This information is not intended to replace advice given to you by your health care provider. Make sure you discuss any questions you have with your health care provider. °  °Document Released: 05/23/2006 Document Revised: 05/14/2014 Document Reviewed: 02/18/2012 °Elsevier Interactive Patient Education ©2016 [employer] °Vaginal Delivery °During delivery, your health care provider will help you give birth to your baby. During a vaginal delivery, you will work to push the baby out of your vagina. However, before you can push your baby out, a few things need to happen. The opening of your uterus (cervix) has to soften, thin out, and open up (dilate) all the way to 10 cm. Also, your baby has to move down from the uterus into your vagina.  °SIGNS OF LABOR  °Your health care provider will first need to make sure you  are in labor. Signs of labor include:  °· Passing what is called the mucous plug before labor begins. This is a small amount of blood-stained mucus. °· Having regular, painful uterine contractions.   °· The time between contractions gets shorter.   °· The discomfort and pain gradually get more intense. °· Contraction pains get worse when walking and do not go away when resting.   °· Your cervix becomes thinner (effacement) and dilates. °BEFORE THE DELIVERY °Once you are in labor and admitted into the hospital or care center, your health care provider may do the following:  °5. Perform a complete physical exam. °6. Review any complications related to pregnancy or labor.  °7. Check your blood pressure, pulse, temperature, and heart rate (vital signs).   °8. Determine if, and when, the rupture of amniotic membranes occurred. °9. Do a vaginal exam (using a sterile glove and lubricant) to determine:   °1. The position (presentation) of the baby. Is the baby's head presenting first (vertex) in the birth canal (vagina), or are the feet or buttocks first (breech)?   °2. The level (station) of the baby's head within   the birth canal.   °3. The effacement and dilatation of the cervix.   °10. An electronic fetal monitor is usually placed on your abdomen when you first arrive. This is used to monitor your contractions and the baby's heart rate. °1. When the monitor is on your abdomen (external fetal monitor), it can only pick up the frequency and length of your contractions. It cannot tell the strength of your contractions. °2. If it becomes necessary for your health care provider to know exactly how strong your contractions are or to see exactly what the baby's heart rate is doing, an internal monitor may be inserted into your vagina and uterus. Your health care provider will discuss the benefits and risks of using an internal monitor and obtain your permission before inserting the device. °3. Continuous fetal monitoring may be  needed if you have an epidural, are receiving certain medicines (such as oxytocin), or have pregnancy or labor complications. °11. An IV access tube may be placed into a vein in your arm to deliver fluids and medicines if necessary. °THREE STAGES OF LABOR AND DELIVERY °Normal labor and delivery is divided into three stages. °First Stage °This stage starts when you begin to contract regularly and your cervix begins to efface and dilate. It ends when your cervix is completely open (fully dilated). The first stage is the longest stage of labor and can last from 3 hours to 15 hours.  °Several methods are available to help with labor pain. You and your health care provider will decide which option is best for you. Options include:  °· Opioid medicines. These are strong pain medicines that you can get through your IV tube or as a shot into your muscle. These medicines lessen pain but do not make it go away completely.  °· Epidural. A medicine is given through a thin tube that is inserted in your back. The medicine numbs the lower part of your body and prevents any pain in that area. °· Paracervical pain medicine. This is an injection of an anesthetic on each side of your cervix.   °· You may request natural childbirth, which does not involve the use of pain medicines or an epidural during labor and delivery. Instead, you will use other things, such as breathing exercises, to help cope with the pain. °Second Stage °The second stage of labor begins when your cervix is fully dilated at 10 cm. It continues until you push your baby down through the birth canal and the baby is born. This stage can take only minutes or several hours. °· The location of your baby's head as it moves through the birth canal is reported as a number called a station. If the baby's head has not started its descent, the station is described as being at minus 3 (-3). When your baby's head is at the zero station, it is at the middle of the birth canal  and is engaged in the pelvis. The station of your baby helps indicate the progress of the second stage of labor. °· When your baby is born, your health care provider may hold the baby with his or her head lowered to prevent amniotic fluid, mucus, and blood from getting into the baby's lungs. The baby's mouth and nose may be suctioned with a small bulb syringe to remove any additional fluid. °· Your health care provider may then place the baby on your stomach. It is important to keep the baby from getting cold. To do this, the health care provider will dry   the baby off, place the baby directly on your skin (with no blankets between you and the baby), and cover the baby with warm, dry blankets.   °· The umbilical cord is cut. °Third Stage °During the third stage of labor, your health care provider will deliver the placenta (afterbirth) and make sure your bleeding is under control. The delivery of the placenta usually takes about 5 minutes but can take up to 30 minutes. After the placenta is delivered, a medicine may be given either by IV or injection to help contract the uterus and control bleeding. If you are planning to breastfeed, you can try to do so now. °After you deliver the placenta, your uterus should contract and get very firm. If your uterus does not remain firm, your health care provider will massage it. This is important because the contraction of the uterus helps cut off bleeding at the site where the placenta was attached to your uterus. If your uterus does not contract properly and stay firm, you may continue to bleed heavily. If there is a lot of bleeding, medicines may be given to contract the uterus and stop the bleeding.  °  °This information is not intended to replace advice given to you by your health care provider. Make sure you discuss any questions you have with your health care provider. °  °Document Released: 01/31/2008 Document Revised: 05/14/2014 Document Reviewed: 12/19/2011 °Elsevier  Interactive Patient Education ©2016 [employer] Inc. ° °

## 2015-02-17 NOTE — MAU Provider Note (Signed)
History    CSN: [id number]  Arrival date and time: 02/17/15 1003   First Provider Initiated Contact with Patient 02/17/15 1118     Chief Complaint  Patient presents with  . possible ROM    HPI Patient is an 19 y.o. G1P0 female at 7291w5d with uncomplicated pregnancy who presents with concern that her water may have broken. This morning, she "went to urinate, and the fluid did not stop." She did not notice any blood or discoloration of the fluid. She experienced loss of fluid otherwise. She denies feeling contractions. She is GBS positive and knows she needs antibiotics after ROM, which also motivated her to get evaluated. She denies feeling contractions. She feels her baby moving "all the time." She denies n/v/d.   OB History    Gravida Para Term Preterm AB TAB SAB Ectopic Multiple Living   1               Past Medical History  Diagnosis Date  . UTI (urinary tract infection)   . Pertussis 03/18/2012  . Anemia     History reviewed. No pertinent past surgical history.  History reviewed. No pertinent family history.  Social History  Substance Use Topics  . Smoking status: Never Smoker   . Smokeless tobacco: None  . Alcohol Use: No    Allergies:  Allergies  Allergen Reactions  . Amoxicillin Rash  . Penicillins Rash    Has patient had a PCN reaction causing immediate rash, facial/tongue/throat swelling, SOB or lightheadedness with hypotension: No Has patient had a PCN reaction causing severe rash involving mucus membranes or skin necrosis: No Has patient had a PCN reaction that required hospitalization No Has patient had a PCN reaction occurring within the last 10 years: No If all of the above answers are "NO", then may proceed with Cephalosporin use.     Prescriptions prior to admission  Medication Sig Dispense Refill Last Dose  . ferrous sulfate 325 (65 FE) MG tablet Take 1 tablet (325 mg total) by mouth daily with breakfast. 30 tablet 2 02/16/2015 at Unknown time   . fluconazole (DIFLUCAN) 150 MG tablet Take 1 tablet (150 mg total) by mouth once. Repeat in 3 days if not better. 2 tablet 0 02/16/2015 at Unknown time  . Prenatal Vit-Min-FA-Fish Oil ([facility] PRENATAL GUMMY) 0.4-113.5 MG CHEW Chew 1 tablet by mouth daily. 30 tablet 11 02/16/2015 at Unknown time  . carboxymethylcellulose (LUBRICATING PLUS EYE DROPS) 0.5 % SOLN Place 1 drop into the left eye 3 (three) times daily as needed. (Patient not taking: Reported on 01/27/2015) 15 mL 0   . docusate sodium (COLACE) 100 MG capsule Take 1 capsule (100 mg total) by mouth 2 (two) times daily. (Patient not taking: Reported on 02/17/2015) 60 capsule 2 Not Taking at Unknown time  . olopatadine (PATANOL) 0.1 % ophthalmic solution Place 1 drop into the left eye 2 (two) times daily. (Patient not taking: Reported on 01/27/2015) 5 mL 0     Review of Systems  Constitutional: Negative for fever and chills.  HENT: Positive for nosebleeds (yesterday).   Eyes: Negative for blurred vision.  Respiratory: Positive for shortness of breath (occasionally).   Cardiovascular: Negative for chest pain.  Gastrointestinal: Negative for nausea, vomiting, abdominal pain and diarrhea.  Genitourinary: Negative for dysuria.  Musculoskeletal: Positive for myalgias (bilateral calf soreness).  Neurological: Negative for weakness and headaches.   Physical Exam   Blood pressure 120/72, pulse 91, temperature 98.2 F (36.8 C), temperature source Oral, resp. rate  16, weight 54.522 kg (120 lb 3.2 oz), last menstrual period 05/31/2014.  Physical Exam  Constitutional: She appears well-developed and well-nourished. No distress.  Cardiovascular: Normal rate, regular rhythm, normal heart sounds and intact distal pulses.  Exam reveals no gallop and no friction rub.   No murmur heard. Respiratory: Effort normal and breath sounds normal.  GI: She exhibits mass (gravid).  Musculoskeletal: Normal range of motion. She exhibits no edema or tenderness.   GU: Cervix dilated to 2 cm and thick; unchanged from prior exams. No pooling seen on speculum exam. Stringy white discharge present. No blood.   MAU Course  Procedures  MDM Speculum exam performed to assess for pooling.  Cervical exam performed to assess for progression towards labor.  Fern test negative.  No contractions seen on Toco. FHT with baseline of 125, with good variability, accelerations and no decelerations.  Assessment and Plan  Patient was evaluated for possible PROM. No pooling was seen on speculum exam, and no ferning was seen under microscope, ruling out rupture of membranes. Patient was not having contractions and had an unchanged cervical exam, making preterm labor unlikely.  Patient discharged home. Provided with kick counts and preterm labor precautions.  Jamelle Haring, MD [name], PGY-1 02/17/2015, 11:19 AM      OB attestation:  I have seen and examined this patient; I agree with above documentation in the Residents note as stated above. I was present with the Resident at the time of interview and physical exam.   Malvina Schadler is a 19 y.o. G1P0 reporting possible ROM  +FM, denies VB + irregular contractions.   PE: BP 104/66 mmHg  Pulse 91  Temp(Src) 98.2 F (36.8 C) (Oral)  Resp 16  Wt 120 lb 3.2 oz (54.522 kg)  LMP 05/31/2014 (Approximate) Gen: calm comfortable, NAD Resp: normal effort, no distress Abd: gravid No pooling of fluid in the vagina on speculum exam. Cervix is unchanged from prior cervical check.    ROS, labs, PMH reviewed NST reactive   Plan:  - patient discharged home.  - fetal kick counts reinforced,  Preterm labor precautions - continue routine follow up in OB clinic - return to MAU if symptoms persist or worsen    Duane Lope, NP 02/19/2015 8:55 AM

## 2015-02-17 NOTE — MAU Note (Signed)
During the night had urge to pee, before she could get up, fluid started coming.  This happened twice, none since.  ? Urine or ROM

## 2015-02-25 ENCOUNTER — Ambulatory Visit (INDEPENDENT_AMBULATORY_CARE_PROVIDER_SITE_OTHER): Payer: Medicaid Other | Admitting: Family Medicine

## 2015-02-25 VITALS — BP 113/63 | HR 100 | Temp 98.3°F | Wt 124.6 lb

## 2015-02-25 DIAGNOSIS — Z3403 Encounter for supervision of normal first pregnancy, third trimester: Secondary | ICD-10-CM

## 2015-02-25 LAB — OB RESULTS CONSOLE GBS: STREP GROUP B AG: POSITIVE

## 2015-02-25 NOTE — Progress Notes (Signed)
Lorelee MarketJada Glaus is a 19 y.o. G1P0 at 6162w6d for routine follow up.  She reports no complaints today.  See flow sheet for details.  A/P: Pregnancy at 8162w6d.  Doing well.   Pregnancy issues include: GBS positive.  Braxton-Hicks contractions Infant feeding choice: Breast and Bottle Contraception choice: Depo Infant circumcision desired yes GBS/GC/CZ results were reviewed today.   Labor precautions reviewed. Kick counts reviewed. Follow up in 1 week.

## 2015-02-25 NOTE — Patient Instructions (Signed)
Vaginal Bleeding During Pregnancy, Third Trimester ° A small amount of bleeding (spotting) from the vagina is common in pregnancy. Sometimes the bleeding is normal and is not a problem, and sometimes it is a sign of something serious. Be sure to tell your doctor about any bleeding from your vagina right away. °HOME CARE °· Watch your condition for any changes. °· Follow your doctor's instructions about how active you can be. °· If you are on bed rest: °¨ You may need to stay in bed and only get up to use the bathroom. °¨ You may be allowed to do some activities. °¨ If you need help, make plans for someone to help you. °· Write down: °¨ The number of pads you use each day. °¨ How often you change pads. °¨ How soaked (saturated) your pads are. °· Do not use tampons. °· Do not douche. °· Do not have sex or orgasms until your doctor says it is okay. °· Follow your doctor's advice about lifting, driving, and doing physical activities. °· If you pass any tissue from your vagina, save the tissue so you can show it to your doctor. °· Only take medicines as told by your doctor. °· Do not take aspirin because it can make you bleed. °· Keep all follow-up visits as told by your doctor. °GET HELP IF:  °· You bleed from your vagina. °· You have cramps. °· You have labor pains. °· You have a fever that does not go away after you take medicine. °GET HELP RIGHT AWAY IF: °· You have very bad cramps in your back or belly (abdomen). °· You have chills. °· You have a gush of fluid from your vagina. °· You pass large clots or tissue from your vagina. °· You bleed more. °· You feel light-headed or weak. °· You pass out (faint). °· You do not feel your baby move around as much as before. °MAKE SURE YOU: °· Understand these instructions. °· Will watch your condition. °· Will get help right away if you are not doing well or get worse. °  °This information is not intended to replace advice given to you by your health care provider. Make sure  you discuss any questions you have with your health care provider. °  °Document Released: 09/07/2013 Document Reviewed: 09/07/2013 °Elsevier Interactive Patient Education ©2016 [employer] ° °

## 2015-03-02 ENCOUNTER — Inpatient Hospital Stay (HOSPITAL_COMMUNITY)
Admission: AD | Admit: 2015-03-02 | Discharge: 2015-03-02 | Disposition: A | Discharge status: Home / Self Care | Payer: Medicaid Other | Source: Ambulatory Visit | Attending: Family Medicine | Admitting: Family Medicine

## 2015-03-02 ENCOUNTER — Encounter (HOSPITAL_COMMUNITY): Payer: Self-pay | Admitting: *Deleted

## 2015-03-02 DIAGNOSIS — Z3493 Encounter for supervision of normal pregnancy, unspecified, third trimester: Secondary | ICD-10-CM | POA: Insufficient documentation

## 2015-03-02 NOTE — MAU Note (Signed)
Pt presents to MAU with complaints of contractions since last night. Denies any vaginal bleeding or LOF 

## 2015-03-04 ENCOUNTER — Encounter: Payer: Medicaid Other | Admitting: Family Medicine

## 2015-03-07 ENCOUNTER — Ambulatory Visit (INDEPENDENT_AMBULATORY_CARE_PROVIDER_SITE_OTHER): Payer: Medicaid Other | Admitting: Family Medicine

## 2015-03-07 VITALS — BP 122/82 | HR 103 | Temp 98.2°F | Wt 125.0 lb

## 2015-03-07 DIAGNOSIS — Z3403 Encounter for supervision of normal first pregnancy, third trimester: Secondary | ICD-10-CM

## 2015-03-07 DIAGNOSIS — Z22330 Carrier of Group B streptococcus: Secondary | ICD-10-CM

## 2015-03-07 DIAGNOSIS — O99820 Streptococcus B carrier state complicating pregnancy: Secondary | ICD-10-CM

## 2015-03-07 NOTE — Progress Notes (Signed)
Subjective:  Erin Wilkins is a 19 y.o. G1P0 at 3946w2d being seen today for ongoing prenatal care.  Patient reports Vaginal pressure, back pain. Denies leaking of fluid.   The following portions of the patient's history were reviewed and updated as appropriate: allergies, current medications, past family history, past medical history, past social history, past surgical history and problem list. Problem list updated.  Objective:   Filed Vitals:   03/07/15 1430  BP: 122/82  Pulse: 103  Temp: 98.2 F (36.8 C)  Weight: 125 lb (56.7 kg)   Fetal Status: Fetal Heart Rate (bpm): 120 Fundal Height: 39 cm Movement: Present  Presentation: Vertex  General:  Alert, oriented and cooperative. Patient is in no acute distress.  Skin: Skin is warm and dry. No rash noted.   Cardiovascular: Normal heart rate noted  Respiratory: Normal respiratory effort, no problems with respiration noted  Abdomen: Soft, gravid, appropriate for gestational age. Pain/Pressure: Present     Pelvic: Vag. Bleeding: None     Cervical exam performed  Dilation: 3 Effacement (%): 70 Station: -2 Presentation: Vertex   Extremities: Normal range of motion.  Edema: None  Mental Status: Normal mood and affect. Normal behavior. Normal judgment and thought content.   Urinalysis:      Assessment and Plan:  Pregnancy: G1P0 at 646w2d  1. Encounter for supervision of normal first pregnancy in third trimester -swept membranes today -Reviewed labor precautions  Term labor symptoms and general obstetric precautions including but not limited to vaginal bleeding, contractions, leaking of fluid and fetal movement were reviewed in detail with the patient. Please refer to After Visit Summary for other counseling recommendations.   Return in about 1 week (around 03/14/2015) for Routine prenatal care.   Federico FlakeKimberly Niles Mahli Glahn, MD

## 2015-03-07 NOTE — Patient Instructions (Signed)
Labor Induction Labor induction is when steps are taken to cause a pregnant woman to begin the labor process. Most women go into labor on their own between 37 weeks and 42 weeks of the pregnancy. When this does not happen or when there is a medical need, methods may be used to induce labor. Labor induction causes a pregnant woman's uterus to contract. It also causes the cervix to soften (ripen), open (dilate), and thin out (efface). Usually, labor is not induced before 39 weeks of the pregnancy unless there is a problem with the baby or mother.  Before inducing labor, your health care provider will consider a number of factors, including the following:  The medical condition of you and the baby.   How many weeks along you are.   The status of the baby's lung maturity.   The condition of the cervix.   The position of the baby.  WHAT ARE THE REASONS FOR LABOR INDUCTION? Labor may be induced for the following reasons:  The health of the baby or mother is at risk.   The pregnancy is overdue by 1 week or more.   The water breaks but labor does not start on its own.   The mother has a health condition or serious illness, such as high blood pressure, infection, placental abruption, or diabetes.  The amniotic fluid amounts are low around the baby.   The baby is distressed.  Convenience or wanting the baby to be born on a certain date is not a reason for inducing labor. WHAT METHODS ARE USED FOR LABOR INDUCTION? Several methods of labor induction may be used, such as:   Prostaglandin medicine. This medicine causes the cervix to dilate and ripen. The medicine will also start contractions. It can be taken by mouth or by inserting a suppository into the vagina.   Inserting a thin tube (catheter) with a balloon on the end into the vagina to dilate the cervix. Once inserted, the balloon is expanded with water, which causes the cervix to open.   Stripping the membranes. Your health  care provider separates amniotic sac tissue from the cervix, causing the cervix to be stretched and causing the release of a hormone called progesterone. This may cause the uterus to contract. It is often done during an office visit. You will be sent home to wait for the contractions to begin. You will then come in for an induction.   Breaking the water. Your health care provider makes a hole in the amniotic sac using a small instrument. Once the amniotic sac breaks, contractions should begin. This may still take hours to see an effect.   Medicine to trigger or strengthen contractions. This medicine is given through an IV access tube inserted into a vein in your arm.  All of the methods of induction, besides stripping the membranes, will be done in the hospital. Induction is done in the hospital so that you and the baby can be carefully monitored.  HOW LONG DOES IT TAKE FOR LABOR TO BE INDUCED? Some inductions can take up to 2-3 days. Depending on the cervix, it usually takes less time. It takes longer when you are induced early in the pregnancy or if this is your first pregnancy. If a mother is still pregnant and the induction has been going on for 2-3 days, either the mother will be sent home or a cesarean delivery will be needed. WHAT ARE THE RISKS ASSOCIATED WITH LABOR INDUCTION? Some of the risks of induction include:     Changes in fetal heart rate, such as too high, too low, or erratic.   Fetal distress.   Chance of infection for the mother and baby.   Increased chance of having a cesarean delivery.   Breaking off (abruption) of the placenta from the uterus (rare).   Uterine rupture (very rare).  When induction is needed for medical reasons, the benefits of induction may outweigh the risks. WHAT ARE SOME REASONS FOR NOT INDUCING LABOR? Labor induction should not be done if:   It is shown that your baby does not tolerate labor.   You have had previous surgeries on your  uterus, such as a myomectomy or the removal of fibroids.   Your placenta lies very low in the uterus and blocks the opening of the cervix (placenta previa).   Your baby is not in a head-down position.   The umbilical cord drops down into the birth canal in front of the baby. This could cut off the baby's blood and oxygen supply.   You have had a previous cesarean delivery.   There are unusual circumstances, such as the baby being extremely premature.    This information is not intended to replace advice given to you by your health care provider. Make sure you discuss any questions you have with your health care provider.   Document Released: 09/12/2006 Document Revised: 05/14/2014 Document Reviewed: 11/20/2012 Elsevier Interactive Patient Education 2016 Else[employer] Inc[employer]  

## 2015-03-08 ENCOUNTER — Ambulatory Visit (INDEPENDENT_AMBULATORY_CARE_PROVIDER_SITE_OTHER): Payer: Medicaid Other | Admitting: Family Medicine

## 2015-03-08 ENCOUNTER — Telehealth: Payer: Self-pay | Admitting: Family Medicine

## 2015-03-08 VITALS — BP 118/70 | HR 100 | Temp 98.2°F | Wt 126.0 lb

## 2015-03-08 DIAGNOSIS — Z3493 Encounter for supervision of normal pregnancy, unspecified, third trimester: Secondary | ICD-10-CM

## 2015-03-08 DIAGNOSIS — Z331 Pregnant state, incidental: Secondary | ICD-10-CM

## 2015-03-08 NOTE — Progress Notes (Signed)
Lorelee MarketJada Ingber is a 19 y.o. G1P0 at 2429w3d for routine follow up.  She reports that she lost her mucus plug this morning after walking several hours yesterday. Says that she noticed a lot of stringy mucus mixed with blood. No gushes of fluid.  See flow sheet for details.  Mucus plud fell out this morning. Stringing with blood. No gushes of fluid.   A/P: Pregnancy at 5229w3d.  Doing well.   Pregnancy issues include: None  Infant feeding choice: Breast/Bottle Contraception choice: Depo Infant circumcision desired yes Labor precautions reviewed. Kick counts reviewed.

## 2015-03-08 NOTE — Patient Instructions (Signed)

## 2015-03-09 ENCOUNTER — Inpatient Hospital Stay (HOSPITAL_COMMUNITY): Payer: Medicaid Other | Admitting: Anesthesiology

## 2015-03-09 ENCOUNTER — Encounter (HOSPITAL_COMMUNITY): Payer: Self-pay | Admitting: *Deleted

## 2015-03-09 ENCOUNTER — Encounter (HOSPITAL_COMMUNITY): Payer: Self-pay | Admitting: Family Medicine

## 2015-03-09 ENCOUNTER — Inpatient Hospital Stay (HOSPITAL_COMMUNITY)
Admission: AD | Admit: 2015-03-09 | Discharge: 2015-03-12 | DRG: 775 | Disposition: A | Discharge status: Home / Self Care | Payer: Medicaid Other | Source: Ambulatory Visit | Attending: Obstetrics and Gynecology | Admitting: Obstetrics and Gynecology

## 2015-03-09 ENCOUNTER — Inpatient Hospital Stay (HOSPITAL_COMMUNITY)
Admission: AD | Admit: 2015-03-09 | Discharge: 2015-03-09 | Disposition: A | Discharge status: Home / Self Care | Payer: Medicaid Other | Source: Ambulatory Visit | Attending: Obstetrics & Gynecology | Admitting: Obstetrics & Gynecology

## 2015-03-09 ENCOUNTER — Telehealth: Payer: Self-pay | Admitting: Family Medicine

## 2015-03-09 DIAGNOSIS — IMO0001 Reserved for inherently not codable concepts without codable children: Secondary | ICD-10-CM

## 2015-03-09 DIAGNOSIS — Z3A39 39 weeks gestation of pregnancy: Secondary | ICD-10-CM

## 2015-03-09 DIAGNOSIS — Z3403 Encounter for supervision of normal first pregnancy, third trimester: Secondary | ICD-10-CM

## 2015-03-09 DIAGNOSIS — O99820 Streptococcus B carrier state complicating pregnancy: Secondary | ICD-10-CM

## 2015-03-09 DIAGNOSIS — O99824 Streptococcus B carrier state complicating childbirth: Principal | ICD-10-CM | POA: Diagnosis present

## 2015-03-09 LAB — TYPE AND SCREEN
ABO/RH(D): O POS
ANTIBODY SCREEN: NEGATIVE

## 2015-03-09 LAB — CBC
HCT: 27.7 % — ABNORMAL LOW (ref 36.0–46.0)
HEMOGLOBIN: 9.2 g/dL — AB (ref 12.0–15.0)
MCH: 29 pg (ref 26.0–34.0)
MCHC: 33.2 g/dL (ref 30.0–36.0)
MCV: 87.4 fL (ref 78.0–100.0)
PLATELETS: 261 10*3/uL (ref 150–400)
RBC: 3.17 MIL/uL — AB (ref 3.87–5.11)
RDW: 16 % — ABNORMAL HIGH (ref 11.5–15.5)
WBC: 11.7 10*3/uL — AB (ref 4.0–10.5)

## 2015-03-09 LAB — ABO/RH: ABO/RH(D): O POS

## 2015-03-09 MED ORDER — EPHEDRINE 5 MG/ML INJ
10.0000 mg | INTRAVENOUS | Status: DC | PRN
  Filled 2015-03-09: qty 2

## 2015-03-09 MED ORDER — LIDOCAINE HCL (PF) 1 % IJ SOLN
30.0000 mL | INTRAMUSCULAR | Status: DC | PRN
  Filled 2015-03-09: qty 30

## 2015-03-09 MED ORDER — CEFAZOLIN SODIUM-DEXTROSE 2-3 GM-% IV SOLR
2.0000 g | Freq: Once | INTRAVENOUS | Status: AC
  Administered 2015-03-09: 2 g via INTRAVENOUS
  Filled 2015-03-09 (×2): qty 50

## 2015-03-09 MED ORDER — OXYTOCIN BOLUS FROM INFUSION: 500.0000 mL | INTRAVENOUS | Status: DC

## 2015-03-09 MED ORDER — DIPHENHYDRAMINE HCL 50 MG/ML IJ SOLN: 12.5000 mg | INTRAMUSCULAR | Status: DC | PRN

## 2015-03-09 MED ORDER — ACETAMINOPHEN 325 MG PO TABS: 650.0000 mg | ORAL_TABLET | ORAL | Status: DC | PRN

## 2015-03-09 MED ORDER — LACTATED RINGERS IV SOLN
500.0000 mL | INTRAVENOUS | Status: DC | PRN
  Administered 2015-03-09: 500 mL via INTRAVENOUS

## 2015-03-09 MED ORDER — FENTANYL 2.5 MCG/ML BUPIVACAINE 1/10 % EPIDURAL INFUSION (WH - ANES)
14.0000 mL/h | INTRAMUSCULAR | Status: DC | PRN
  Administered 2015-03-09 (×3): 14 mL/h via EPIDURAL
  Filled 2015-03-09 (×2): qty 125

## 2015-03-09 MED ORDER — CEFAZOLIN SODIUM 1-5 GM-% IV SOLN
1.0000 g | Freq: Three times a day (TID) | INTRAVENOUS | Status: DC
  Administered 2015-03-09: 1 g via INTRAVENOUS
  Filled 2015-03-09 (×3): qty 50

## 2015-03-09 MED ORDER — OXYCODONE-ACETAMINOPHEN 5-325 MG PO TABS: 1.0000 | ORAL_TABLET | ORAL | Status: DC | PRN

## 2015-03-09 MED ORDER — LIDOCAINE HCL (PF) 1 % IJ SOLN
INTRAMUSCULAR | Status: DC | PRN
  Administered 2015-03-09: 5 mL via EPIDURAL
  Administered 2015-03-09: 2 mL via EPIDURAL
  Administered 2015-03-09: 3 mL via EPIDURAL

## 2015-03-09 MED ORDER — LACTATED RINGERS IV SOLN
INTRAVENOUS | Status: DC
  Administered 2015-03-09: 125 mL/h via INTRAVENOUS

## 2015-03-09 MED ORDER — OXYCODONE-ACETAMINOPHEN 5-325 MG PO TABS: 2.0000 | ORAL_TABLET | ORAL | Status: DC | PRN

## 2015-03-09 MED ORDER — PHENYLEPHRINE 40 MCG/ML (10ML) SYRINGE FOR IV PUSH (FOR BLOOD PRESSURE SUPPORT)
80.0000 ug | PREFILLED_SYRINGE | INTRAVENOUS | Status: DC | PRN
  Filled 2015-03-09: qty 20
  Filled 2015-03-09: qty 2

## 2015-03-09 MED ORDER — ONDANSETRON HCL 4 MG/2ML IJ SOLN
4.0000 mg | Freq: Four times a day (QID) | INTRAMUSCULAR | Status: DC | PRN
  Administered 2015-03-10: 4 mg via INTRAVENOUS
  Filled 2015-03-09: qty 2

## 2015-03-09 MED ORDER — OXYTOCIN 40 UNITS IN LACTATED RINGERS INFUSION - SIMPLE MED
62.5000 mL/h | INTRAVENOUS | Status: DC
  Filled 2015-03-09: qty 1000

## 2015-03-09 MED ORDER — OXYTOCIN 40 UNITS IN LACTATED RINGERS INFUSION - SIMPLE MED: 1.0000 m[IU]/min | INTRAVENOUS | Status: DC

## 2015-03-09 MED ORDER — CITRIC ACID-SODIUM CITRATE 334-500 MG/5ML PO SOLN: 30.0000 mL | ORAL | Status: DC | PRN

## 2015-03-09 MED ORDER — TERBUTALINE SULFATE 1 MG/ML IJ SOLN
0.2500 mg | Freq: Once | INTRAMUSCULAR | Status: DC | PRN
  Filled 2015-03-09: qty 1

## 2015-03-09 NOTE — H&P (Signed)
LABOR AND DELIVERY ADMISSION HISTORY AND PHYSICAL NOTE  Lorelee MarketJada Christoph is a 19 y.o. female G1P0 with IUP at 9941w4d by ultrasound at 12 weeks presents for spontaneous onset of labor. Mel AlmondJada reports that around 3 am this morning she was awakened by the onset of uterine contractions occurring approximately every 10 minutes prompting her to go to the MAU. Upon arrival she was evaluated to be 4 cm dilation without significant progression of the next few hours despite 1 hour of ambulation. Her membranes are unruptured. After approximately 3 hours of observation in the MAU, Erla reports she was discharged home with instructions to return when her labor progressed. She subsequently has returned about 6 hours after that initial encounter. She is not reporting strong uterine contractions every approx. 5 minutes. She denies any leakage or loss of fluid, although she states she noted passage of her mucus plug yesterday with streaks of blood. No overt vaginal bleeding. She endorses good fetal movement. Other pertinent negatives on ROS include: no blurry vision, no headaches, no RUQ or epigastric pain. See below for complete ROS.   Prenatal History/Complications: She has [facility]  [facility] Labs  Dating 12 wk sono  Blood type: O/POS/-- (04/15 1358)   Genetic Screen  N/A  Antibody:NEG (04/15 1358)  Anatomic US  Normal  Rubella: 3.43 (04/15 1358)  GTT  Early:               Third trimester:   RPR: NON REAC (08/26 1005)   Flu vaccine   HBsAg: NEGATIVE (04/15 1358)   TDaP vaccine  12/31/2014                        HIV: NONREACTIVE (08/26 1005)   Baby Food  Breast/Bottle                                         GBS: Positive  Contraception  Depo  Pap: N/A  Circumcision  Yes  GC/CT: Neg/Neg  Pediatrician  [facility] [phone]   Support Person  Mother; FOB and boyfriend Christian    Provider -1st call - Jacquiline Doealeb [facility]  Past Medical History: Past  Medical History  Diagnosis Date  . UTI (urinary tract infection)   . Pertussis 03/18/2012  . Anemia     Past Surgical History: Past Surgical History  Procedure Laterality Date  . No past surgeries      Obstetrical History: OB History    Gravida Para Term Preterm AB TAB SAB Ectopic Multiple Living   1               Social History: Social History   Social History  . Marital Status: Single    Spouse Name: N/A  . Number of Children: N/A  . Years of Education: N/A   Social History Main Topics  . Smoking status: Never Smoker   . Smokeless tobacco: Never Used  . Alcohol Use: No  . Drug Use: No  . Sexual Activity: Yes   Other Topics Concern  . None   Social History Narrative    Family History: History reviewed. No pertinent family history.  Allergies: Allergies  Allergen Reactions  . Amoxicillin Rash  . Penicillins Rash    Has patient had a PCN reaction causing immediate rash, facial/tongue/throat swelling, SOB or lightheadedness with  hypotension: No Has patient had a PCN reaction causing severe rash involving mucus membranes or skin necrosis: No Has patient had a PCN reaction that required hospitalization No Has patient had a PCN reaction occurring within the last 10 years: No If all of the above answers are "NO", then may proceed with Cephalosporin use.     Prescriptions prior to admission  Medication Sig Dispense Refill Last Dose  . carboxymethylcellulose (LUBRICATING PLUS EYE DROPS) 0.5 % SOLN Place 1 drop into the left eye 3 (three) times daily as needed. 15 mL 0 03/08/2015 at Unknown time  . docusate sodium (COLACE) 100 MG capsule Take 1 capsule (100 mg total) by mouth 2 (two) times daily. 60 capsule 2 03/08/2015 at Unknown time  . ferrous sulfate 325 (65 FE) MG tablet Take 1 tablet (325 mg total) by mouth daily with breakfast. 30 tablet 2 03/08/2015 at Unknown time  . fluconazole (DIFLUCAN) 150 MG tablet Take 1 tablet (150 mg total) by mouth once. Repeat in  3 days if not better. 2 tablet 0 03/08/2015 at Unknown time  . olopatadine (PATANOL) 0.1 % ophthalmic solution Place 1 drop into the left eye 2 (two) times daily. 5 mL 0 03/08/2015 at Unknown time  . Prenatal Vit-Min-FA-Fish Oil ([employer] PRENATAL GUMMY) 0.4-113.5 MG CHEW Chew 1 tablet by mouth daily. 30 tablet 11 03/08/2015 at Unknown time     Review of Systems  Review of Systems  Constitutional: Negative for fever and chills.  HENT: Negative for congestion, ear pain and sore throat.   Eyes: Negative for blurred vision and double vision.  Respiratory: Negative for cough and shortness of breath.   Cardiovascular: Negative for chest pain and leg swelling.  Gastrointestinal: Negative for nausea, vomiting and diarrhea.  Genitourinary: Negative for dysuria and hematuria.  Skin: Negative for rash.  Neurological: Negative for headaches.  Psychiatric/Behavioral: Negative for substance abuse.    Filed Vitals:   03/09/15 1223 03/09/15 1311  BP: 120/73 124/72  Pulse: 103 89  Temp:  98.9 F (37.2 C)  TempSrc:  Oral  Resp: 18 16  Height:   (1.549 m)  Weight:  126 lb (57.153 kg)    GEN: alert, comfortable-appearing woman resting in bed. Boyfriend and mother at the bedside. PULM: CTAB on frontal field exam CV: RRR, S1 and S2 heard, no M/R/G appreciated ABD: Gravid. Abdomen NTTP. No epigastric of RUQ tenderness. No guarding. Fetus felt to be vertex by Leopold's. GU: Cervix 5 / 80% / 0 as examined by Doloris Hall RN EXTR: No LE edema or calf tenderness.  FHT: HR 125 / moderate variability / +accels / + one early decel, no variables, no lates Toco: ctx, regular, q4 min  Results for orders placed or performed during the hospital encounter of 03/09/15 (from the past 24 hour(s))  CBC     Status: Abnormal   Collection Time: 03/09/15 12:55 PM  Result Value Ref Range   WBC 11.7 (H) 4.0 - 10.5 K/uL   RBC 3.17 (L) 3.87 - 5.11 MIL/uL   Hemoglobin 9.2 (L) 12.0 - 15.0 g/dL   HCT 40.9 (L) 81.1 -  46.0 %   MCV 87.4 78.0 - 100.0 fL   MCH 29.0 26.0 - 34.0 pg   MCHC 33.2 30.0 - 36.0 g/dL   RDW 91.4 (H) 78.2 - 95.6 %   Platelets 261 150 - 400 K/uL   Prenatal Transfer Tool  Maternal Diabetes: No Genetic Screening: Declined Maternal Ultrasounds/Referrals: Normal Fetal Ultrasounds or other Referrals:  None Maternal Substance Abuse:  No Significant Maternal Medications:  None Significant Maternal Lab Results: Lab values include: Group B Strep positive  Results for orders placed or performed during the hospital encounter of 03/09/15 (from the past 24 hour(s))  CBC   Collection Time: 03/09/15 12:55 PM  Result Value Ref Range   WBC 11.7 (H) 4.0 - 10.5 K/uL   RBC 3.17 (L) 3.87 - 5.11 MIL/uL   Hemoglobin 9.2 (L) 12.0 - 15.0 g/dL   HCT 81.1 (L) 91.4 - 78.2 %   MCV 87.4 78.0 - 100.0 fL   MCH 29.0 26.0 - 34.0 pg   MCHC 33.2 30.0 - 36.0 g/dL   RDW 95.6 (H) 21.3 - 08.6 %   Platelets 261 150 - 400 K/uL    Patient Active Problem List   Diagnosis Date Noted  . Active labor 03/09/2015  . GBS (group B Streptococcus carrier), +RV culture, currently pregnant 03/07/2015  . Encounter for supervision of normal pregnancy 09/16/2014  . Nausea/vomiting in pregnancy 08/04/2014  . Allergic rhinitis 01/09/2013  . ACNE, MILD 02/09/2010    Assessment: Almedia Cordell is a 19 y.o. primigravida at [redacted]w[redacted]d here for spontaneous onset of labor.   -- Labor: Spontaneous labor, progressing normally -- Uncomplicated pregnancy -- Augmentation with AROM and Pitocin as appropriate -- Patient may have epidural at her request -- GBS: positive, Ancef ordered (PCN allergic) -- Feeding: Bottle and Breast -- Contraception: Hormonal Contraception: Injection, Rings and Patches (Depo) -- Circ: yes, outpatient by family doc  Diet: Clears PPx: None for now, low risk & ambulating Code Status: FULL Dispo: Admit to L&D for routine inpatient care of laboring patient.  Gerri Spore, MD 03/09/2015, 1:25 PM   OB  fellow attestation: I have seen and examined this patient; I agree with above documentation in the resident's note.   Alyvia Derk is a 19 y.o. G1P0 here for active labor  PE: BP 122/66 mmHg  Pulse 96  Temp(Src) 98.9 F (37.2 C) (Oral)  Resp 16  Ht  (1.549 m)  Wt 126 lb (57.153 kg)  BMI 23.82 kg/m2  LMP 05/31/2014 (Approximate) Gen: calm comfortable, NAD Resp: normal effort, no distress Abd: gravid  ROS, labs, PMH reviewed  Plan: Labor: expectant management.  Pain: desires epidural FWB: Cat I GBS positive- Anceph orded given h/o PCN allergy.  Called Dr. Jimmey Ralph on admission and he will come to [facility] ASAP. Likely arrival 3-4 PM  Federico Flake MD MPH Family Medicine, OB Fellow 03/09/2015, 3:04 PM

## 2015-03-09 NOTE — Anesthesia Preprocedure Evaluation (Signed)
Anesthesia Evaluation  Patient identified by MRN, date of birth, ID band Patient awake    Reviewed: Allergy & Precautions, NPO status , Patient's Chart, lab work & pertinent test results  Airway Mallampati: II  TM Distance: >3 FB Neck ROM: Full    Dental  (+) Teeth Intact, Dental Advisory Given   Pulmonary neg pulmonary ROS,    Pulmonary exam normal breath sounds clear to auscultation       Cardiovascular Exercise Tolerance: Good negative cardio ROS Normal cardiovascular exam Rhythm:Regular Rate:Normal     Neuro/Psych negative neurological ROS  negative psych ROS   GI/Hepatic negative GI ROS, Neg liver ROS,   Endo/Other  negative endocrine ROS  Renal/GU negative Renal ROS     Musculoskeletal negative musculoskeletal ROS (+)   Abdominal   Peds  Hematology  (+) Blood dyscrasia, anemia ,   Anesthesia Other Findings Day of surgery medications reviewed with the patient.  Reproductive/Obstetrics (+) Pregnancy                             Anesthesia Physical Anesthesia Plan  ASA: II  Anesthesia Plan: Epidural   Post-op Pain Management:    Induction:   Airway Management Planned:   Additional Equipment:   Intra-op Plan:   Post-operative Plan:   Informed Consent: I have reviewed the patients History and Physical, chart, labs and discussed the procedure including the risks, benefits and alternatives for the proposed anesthesia with the patient or authorized representative who has indicated his/her understanding and acceptance.   Dental advisory given  Plan Discussed with:   Anesthesia Plan Comments: (Patient identified. Risks/Benefits/Options discussed with patient including but not limited to bleeding, infection, nerve damage, paralysis, failed block, incomplete pain control, headache, blood pressure changes, nausea, vomiting, reactions to medication both or allergic, itching and  postpartum back pain. Confirmed with bedside nurse the patient's most recent platelet count. Confirmed with patient that they are not currently taking any anticoagulation, have any bleeding history or any family history of bleeding disorders. Patient expressed understanding and wished to proceed. All questions were answered. )        Anesthesia Quick Evaluation

## 2015-03-09 NOTE — Telephone Encounter (Signed)
[facility] because the pt was having labor pains early this morning. Pt mother reports taking the pt to the hospital this am around 3am and was sent back home. Pt mother now reports that pt is in so much pain that it is unbearable, contractions are 3 min apart and pt wanted to make sure Dr. Jimmey RalphParker would be there. I spoke with Dorisann Framesamika Martin, RN and she informed pt mother to take the pt back to the hospital where they can page Dr. Jimmey RalphParker. Pt mother expressed understanding. Sadie Reynolds, ASA

## 2015-03-09 NOTE — Progress Notes (Signed)
Erin Wilkins is a 19 y.o. G1P0 at 3147w4d  admitted for active labor  Subjective: Comfortable with epidural. Not feeling contractions. No complaints.   AROM performed @1825 , tolerated well.   Objective: Filed Vitals:   03/09/15 1715 03/09/15 1716 03/09/15 1731 03/09/15 1801  BP:  115/68 104/78 113/78  Pulse: 87 88 152 99  Temp:      TempSrc:      Resp:      Height:      Weight:      SpO2: 100%         FHT:  FHR: 125 bpm, variability: moderate,  accelerations:  Present,  decelerations:  Absent UC:   regular, every 4-5 minutes SVE:   Dilation: 6 Effacement (%): 80 Station: 0 Exam by:: MD Jimmey RalphParker  Labs: Lab Results  Component Value Date   WBC 11.7* 03/09/2015   HGB 9.2* 03/09/2015   HCT 27.7* 03/09/2015   MCV 87.4 03/09/2015   PLT 261 03/09/2015    Assessment / Plan: Spontaneous labor, progressing normally  Labor: Progressing normally, s/p AROM, consider starting pitocin for augmentation  Fetal Wellbeing:  Category I Pain Control:  Epidural in place Anticipated MOD:  NSVD  Jacquiline Doealeb Voula Waln 03/09/2015, 6:30 PM

## 2015-03-09 NOTE — Anesthesia Procedure Notes (Signed)
Epidural Patient location during procedure: OB  Staffing Anesthesiologist: TURK, STEPHEN EDWARD Performed by: anesthesiologist   Preanesthetic Checklist Completed: patient identified, pre-op evaluation, timeout performed, IV checked, risks and benefits discussed and monitors and equipment checked  Epidural Patient position: sitting Prep: DuraPrep Patient monitoring: blood pressure and continuous pulse ox Approach: midline Location: L3-L4 Injection technique: LOR air  Needle:  Needle type: Tuohy  Needle gauge: 17 G Needle length: 9 cm Needle insertion depth: 4 cm Catheter size: 19 Gauge Catheter at skin depth: 9 cm Test dose: negative and Other (1% Lidocaine)  Additional Notes Patient identified.  Risk benefits discussed including failed block, incomplete pain control, headache, nerve damage, paralysis, blood pressure changes, nausea, vomiting, reactions to medication both toxic or allergic, and postpartum back pain.  Patient expressed understanding and wished to proceed.  All questions were answered.  Sterile technique used throughout procedure and epidural site dressed with sterile barrier dressing. No paresthesia or other complications noted. The patient did not experience any signs of intravascular injection such as tinnitus or metallic taste in mouth nor signs of intrathecal spread such as rapid motor block. Please see nursing notes for vital signs. Reason for block:procedure for pain   

## 2015-03-09 NOTE — MAU Note (Signed)
Pt presents to MAU with complaints of contractions. Pt was evaluated this morning and sent home around 6am. States she was 4 cm dilated

## 2015-03-09 NOTE — Telephone Encounter (Signed)
Will forward to MD. Omarius Grantham,CMA  

## 2015-03-09 NOTE — Telephone Encounter (Signed)
I have already spoken to [name] and her mother and [facility].  [name], MD [facility] Resident PGY-2 03/09/2015 2:40 PM

## 2015-03-09 NOTE — MAU Note (Signed)
PT SAYS SHE  HURT  BAD AT   0130.  [facility]-     [facility]- [facility]-  VE  YESTERDAY   4  CM.     DENIES HSV AND MRSA.   GBS- POSITIVE.

## 2015-03-09 NOTE — Progress Notes (Signed)
Erin Wilkins is a 19 y.o. G1P0 at 2154w4d  admitted for active labor  Subjective:   Objective: Filed Vitals:   03/09/15 1223 03/09/15 1311  BP: 120/73 124/72  Pulse: 103 89  Temp:  98.9 F (37.2 C)  TempSrc:  Oral  Resp: 18 16  Height:  5\' 1"  (1.549 m)  Weight:  126 lb (57.153 kg)      FHT:  FHR: 125 bpm, variability: moderate,  accelerations:  Present,  decelerations:  Absent UC:   regular, every 3-5 minutes SVE:   Dilation: 5 Effacement (%): 80 Station: 0, +1 Exam by:: Erin Wilkins  Labs: Lab Results  Component Value Date   WBC 11.7* 03/09/2015   HGB 9.2* 03/09/2015   HCT 27.7* 03/09/2015   MCV 87.4 03/09/2015   PLT 261 03/09/2015    Assessment / Plan: Spontaneous labor, progressing normally  Labor: Progressing normally Fetal Wellbeing:  Category I Pain Control:  Epidural planned Anticipated MOD:  NSVD  Erin Wilkins 03/09/2015, 2:35 PM

## 2015-03-09 NOTE — Progress Notes (Signed)
Erin Wilkins is a 19 y.o. G1P0 at 416w4d  admitted for active labor  Subjective: Comfortable with epidural. Not feeling contractions. No complaints.    AROM performed @1825 , tolerated well.   Objective: Filed Vitals:   03/09/15 1931 03/09/15 2001 03/09/15 2031 03/09/15 2101  BP: 121/81 125/75 124/62 122/72  Pulse: 99 107 99 86  Temp:   98.6 F (37 C)   TempSrc:   Oral   Resp:  16    Height:      Weight:      SpO2:          FHT:  FHR: 130 bpm, variability: moderate,  accelerations:  Present,  decelerations:  Absent UC:   regular, every 2-3 minutes SVE:   Dilation: 8.5 Effacement (%): 100 Station: 0 Exam by:: K.Shaw,CNM  Labs: Lab Results  Component Value Date   WBC 11.7* 03/09/2015   HGB 9.2* 03/09/2015   HCT 27.7* 03/09/2015   MCV 87.4 03/09/2015   PLT 261 03/09/2015    Assessment / Plan: Spontaneous labor, progressing normally  Labor: Progressing normally, s/p AROM Fetal Wellbeing:  Category I Pain Control:  Epidural in place Anticipated MOD:  NSVD  Erin Wilkins 03/09/2015, 10:37 PM

## 2015-03-09 NOTE — Progress Notes (Signed)
Erin MarketJada Wilkins is a 19 y.o. G1P0 at 5137w4d  admitted for active labor  Subjective: Comfortable with epidural. No complaints.   Objective: Filed Vitals:   03/09/15 1606 03/09/15 1611 03/09/15 1616 03/09/15 1619  BP: 105/50 99/55 92/52  120/71  Pulse: 79 91 96 91  Temp:      TempSrc:      Resp: 16   16  Height:      Weight:      SpO2:          FHT:  FHR: 125 bpm, variability: moderate,  accelerations:  Present,  decelerations:  Absent UC:   regular, every 4-5 minutes SVE:   Dilation: 5.5 Effacement (%): 90 Station: 0 Exam by:: Doloris HallJenny Middleton RN  Labs: Lab Results  Component Value Date   WBC 11.7* 03/09/2015   HGB 9.2* 03/09/2015   HCT 27.7* 03/09/2015   MCV 87.4 03/09/2015   PLT 261 03/09/2015    Assessment / Plan: Spontaneous labor, progressing normally  Labor: Progressing normally, consider starting pitocin for augmentation  Fetal Wellbeing:  Category I Pain Control:  Epidural in place Anticipated MOD:  NSVD  Erin Wilkins 03/09/2015, 4:40 PM

## 2015-03-09 NOTE — Progress Notes (Signed)
Erin Wilkins is a 19 y.o. G1P0 at 4873w4d  admitted for active labor  Subjective: Comfortable with epidural. Not feeling contractions. No complaints.    AROM performed @1825 , tolerated well.   Objective: Filed Vitals:   03/09/15 1901 03/09/15 1931 03/09/15 2001 03/09/15 2031  BP: 115/79 121/81 125/75 124/62  Pulse: 108 99 107 99  Temp:    98.6 F (37 C)  TempSrc:    Oral  Resp:   16   Height:      Weight:      SpO2:          FHT:  FHR: 125 bpm, variability: moderate,  accelerations:  Present,  decelerations:  Absent UC:   regular, every 2-3 minutes SVE:   Dilation: 7 Effacement (%): 90 Station: 0 Exam by:: Dr.Duaa Stelzner  Labs: Lab Results  Component Value Date   WBC 11.7* 03/09/2015   HGB 9.2* 03/09/2015   HCT 27.7* 03/09/2015   MCV 87.4 03/09/2015   PLT 261 03/09/2015    Assessment / Plan: Spontaneous labor, progressing normally  Labor: Progressing normally, s/p AROM, consider starting pitocin for augmentation  Fetal Wellbeing:  Category I Pain Control:  Epidural in place Anticipated MOD:  NSVD  Erin Wilkins 03/09/2015, 8:54 PM

## 2015-03-09 NOTE — Discharge Instructions (Signed)
Braxton Hicks Contractions °Contractions of the uterus can occur throughout pregnancy. Contractions are not always a sign that you are in labor.  °WHAT ARE BRAXTON HICKS CONTRACTIONS?  °Contractions that occur before labor are called Braxton Hicks contractions, or false labor. Toward the end of pregnancy (32-34 weeks), these contractions can develop more often and may become more forceful. This is not true labor because these contractions do not result in opening (dilatation) and thinning of the cervix. They are sometimes difficult to tell apart from true labor because these contractions can be forceful and people have different pain tolerances. You should not feel embarrassed if you go to the hospital with false labor. Sometimes, the only way to tell if you are in true labor is for your health care provider to look for changes in the cervix. °If there are no prenatal problems or other health problems associated with the pregnancy, it is completely safe to be sent home with false labor and await the onset of true labor. °HOW CAN YOU TELL THE DIFFERENCE BETWEEN TRUE AND FALSE LABOR? °False Labor °· The contractions of false labor are usually shorter and not as hard as those of true labor.   °· The contractions are usually irregular.   °· The contractions are often felt in the front of the lower abdomen and in the groin.   °· The contractions may go away when you walk around or change positions while lying down.   °· The contractions get weaker and are shorter lasting as time goes on.   °· The contractions do not usually become progressively stronger, regular, and closer together as with true labor.   °True Labor °· Contractions in true labor last 30-70 seconds, become very regular, usually become more intense, and increase in frequency.   °· The contractions do not go away with walking.   °· The discomfort is usually felt in the top of the uterus and spreads to the lower abdomen and low back.   °· True labor can be  determined by your health care provider with an exam. This will show that the cervix is dilating and getting thinner.   °WHAT TO REMEMBER °· Keep up with your usual exercises and follow other instructions given by your health care provider.   °· Take medicines as directed by your health care provider.   °· Keep your regular prenatal appointments.   °· Eat and drink lightly if you think you are going into labor.   °· If Braxton Hicks contractions are making you uncomfortable:   °¨ Change your position from lying down or resting to walking, or from walking to resting.   °¨ Sit and rest in a tub of warm water.   °¨ Drink 2-3 glasses of water. Dehydration may cause these contractions.   °¨ Do slow and deep breathing several times an hour.   °WHEN SHOULD I SEEK IMMEDIATE MEDICAL CARE? °Seek immediate medical care if: °· Your contractions become stronger, more regular, and closer together.   °· You have fluid leaking or gushing from your vagina.   °· You have a fever.   °· You pass blood-tinged mucus.   °· You have vaginal bleeding.   °· You have continuous abdominal pain.   °· You have low back pain that you never had before.   °· You feel your baby's head pushing down and causing pelvic pressure.   °· Your baby is not moving as much as it used to.   °  °This information is not intended to replace advice given to you by your health care provider. Make sure you discuss any questions you have with your health care   provider. °  °Document Released: 04/23/2005 Document Revised: 04/28/2013 Document Reviewed: 02/02/2013 °Elsevier Interactive Patient Education ©2016 [employer] ° ° ° °Third Trimester of Pregnancy °The third trimester is from week 29 through week 42, months 7 through 9. The third trimester is a time when the fetus is growing rapidly. At the end of the ninth month, the fetus is about 20 inches in length and weighs 6-10 pounds.  °BODY CHANGES °Your body goes through many changes during pregnancy. The changes vary  from woman to woman.  °· Your weight will continue to increase. You can expect to gain 25-35 pounds (11-16 kg) by the end of the pregnancy. °· You may begin to get stretch marks on your hips, abdomen, and breasts. °· You may urinate more often because the fetus is moving lower into your pelvis and pressing on your bladder. °· You may develop or continue to have heartburn as a result of your pregnancy. °· You may develop constipation because certain hormones are causing the muscles that push waste through your intestines to slow down. °· You may develop hemorrhoids or swollen, bulging veins (varicose veins). °· You may have pelvic pain because of the weight gain and pregnancy hormones relaxing your joints between the bones in your pelvis. Backaches may result from overexertion of the muscles supporting your posture. °· You may have changes in your hair. These can include thickening of your hair, rapid growth, and changes in texture. Some women also have hair loss during or after pregnancy, or hair that feels dry or thin. Your hair will most likely return to normal after your baby is born. °· Your breasts will continue to grow and be tender. A yellow discharge may leak from your breasts called colostrum. °· Your belly button may stick out. °· You may feel short of breath because of your expanding uterus. °· You may notice the fetus "dropping," or moving lower in your abdomen. °· You may have a bloody mucus discharge. This usually occurs a few days to a week before labor begins. °· Your cervix becomes thin and soft (effaced) near your due date. °WHAT TO EXPECT AT YOUR PRENATAL EXAMS  °You will have prenatal exams every 2 weeks until week 36. Then, you will have weekly prenatal exams. During a routine prenatal visit: °· You will be weighed to make sure you and the fetus are growing normally. °· Your blood pressure is taken. °· Your abdomen will be measured to track your baby's growth. °· The fetal heartbeat will be  listened to. °· Any test results from the previous visit will be discussed. °· You may have a cervical check near your due date to see if you have effaced. °At around 36 weeks, your caregiver will check your cervix. At the same time, your caregiver will also perform a test on the secretions of the vaginal tissue. This test is to determine if a type of bacteria, Group B streptococcus, is present. Your caregiver will explain this further. °Your caregiver may ask you: °· What your birth plan is. °· How you are feeling. °· If you are feeling the baby move. °· If you have had any abnormal symptoms, such as leaking fluid, bleeding, severe headaches, or abdominal cramping. °· If you are using any tobacco products, including cigarettes, chewing tobacco, and electronic cigarettes. °· If you have any questions. °Other tests or screenings that may be performed during your third trimester include: °· Blood tests that check for low iron levels (anemia). °· Fetal   testing to check the health, activity level, and growth of the fetus. Testing is done if you have certain medical conditions or if there are problems during the pregnancy. °· HIV (human immunodeficiency virus) testing. If you are at high risk, you may be screened for HIV during your third trimester of pregnancy. °FALSE LABOR °You may feel small, irregular contractions that eventually go away. These are called Braxton Hicks contractions, or false labor. Contractions may last for hours, days, or even weeks before true labor sets in. If contractions come at regular intervals, intensify, or become painful, it is best to be seen by your caregiver.  °SIGNS OF LABOR  °· Menstrual-like cramps. °· Contractions that are 5 minutes apart or less. °· Contractions that start on the top of the uterus and spread down to the lower abdomen and back. °· A sense of increased pelvic pressure or back pain. °· A watery or bloody mucus discharge that comes from the vagina. °If you have any of  these signs before the 37th week of pregnancy, call your caregiver right away. You need to go to the hospital to get checked immediately. °HOME CARE INSTRUCTIONS  °· Avoid all smoking, herbs, alcohol, and unprescribed drugs. These chemicals affect the formation and growth of the baby. °· Do not use any tobacco products, including cigarettes, chewing tobacco, and electronic cigarettes. If you need help quitting, ask your health care provider. You may receive counseling support and other resources to help you quit. °· Follow your caregiver's instructions regarding medicine use. There are medicines that are either safe or unsafe to take during pregnancy. °· Exercise only as directed by your caregiver. Experiencing uterine cramps is a good sign to stop exercising. °· Continue to eat regular, healthy meals. °· Wear a good support bra for breast tenderness. °· Do not use hot tubs, steam rooms, or saunas. °· Wear your seat belt at all times when driving. °· Avoid raw meat, uncooked cheese, cat litter boxes, and soil used by cats. These carry germs that can cause birth defects in the baby. °· Take your prenatal vitamins. °· Take 1500-2000 mg of calcium daily starting at the 20th week of pregnancy until you deliver your baby. °· Try taking a stool softener (if your caregiver approves) if you develop constipation. Eat more high-fiber foods, such as fresh vegetables or fruit and whole grains. Drink plenty of fluids to keep your urine clear or pale yellow. °· Take warm sitz baths to soothe any pain or discomfort caused by hemorrhoids. Use hemorrhoid cream if your caregiver approves. °· If you develop varicose veins, wear support hose. Elevate your feet for 15 minutes, 3-4 times a day. Limit salt in your diet. °· Avoid heavy lifting, wear low heal shoes, and practice good posture. °· Rest a lot with your legs elevated if you have leg cramps or low back pain. °· Visit your dentist if you have not gone during your pregnancy. Use a  soft toothbrush to brush your teeth and be gentle when you floss. °· A sexual relationship may be continued unless your caregiver directs you otherwise. °· Do not travel far distances unless it is absolutely necessary and only with the approval of your caregiver. °· Take prenatal classes to understand, practice, and ask questions about the labor and delivery. °· Make a trial run to the hospital. °· Pack your hospital bag. °· Prepare the baby's nursery. °· Continue to go to all your prenatal visits as directed by your caregiver. °SEEK MEDICAL CARE   IF: °· You are unsure if you are in labor or if your water has broken. °· You have dizziness. °· You have mild pelvic cramps, pelvic pressure, or nagging pain in your abdominal area. °· You have persistent nausea, vomiting, or diarrhea. °· You have a bad smelling vaginal discharge. °· You have pain with urination. °SEEK IMMEDIATE MEDICAL CARE IF:  °· You have a fever. °· You are leaking fluid from your vagina. °· You have spotting or bleeding from your vagina. °· You have severe abdominal cramping or pain. °· You have rapid weight loss or gain. °· You have shortness of breath with chest pain. °· You notice sudden or extreme swelling of your face, hands, ankles, feet, or legs. °· You have not felt your baby move in over an hour. °· You have severe headaches that do not go away with medicine. °· You have vision changes. °  °This information is not intended to replace advice given to you by your health care provider. Make sure you discuss any questions you have with your health care provider. °  °Document Released: 04/17/2001 Document Revised: 05/14/2014 Document Reviewed: 06/24/2012 °Elsevier Interactive Patient Education ©2016 Elsevier Inc. ° °

## 2015-03-10 ENCOUNTER — Encounter (HOSPITAL_COMMUNITY): Payer: Self-pay | Admitting: *Deleted

## 2015-03-10 DIAGNOSIS — Z3A39 39 weeks gestation of pregnancy: Secondary | ICD-10-CM

## 2015-03-10 DIAGNOSIS — O99824 Streptococcus B carrier state complicating childbirth: Secondary | ICD-10-CM

## 2015-03-10 LAB — RPR: RPR Ser Ql: NONREACTIVE

## 2015-03-10 MED ORDER — INFLUENZA VAC SPLIT QUAD 0.5 ML IM SUSY
0.5000 mL | PREFILLED_SYRINGE | INTRAMUSCULAR | Status: AC
  Administered 2015-03-10: 0.5 mL via INTRAMUSCULAR
  Filled 2015-03-10: qty 0.5

## 2015-03-10 MED ORDER — ACETAMINOPHEN 325 MG PO TABS
650.0000 mg | ORAL_TABLET | ORAL | Status: DC | PRN
Start: 2015-03-10 — End: 2015-03-12

## 2015-03-10 MED ORDER — IBUPROFEN 600 MG PO TABS
600.0000 mg | ORAL_TABLET | Freq: Four times a day (QID) | ORAL | Status: DC
  Administered 2015-03-10 – 2015-03-12 (×8): 600 mg via ORAL
  Filled 2015-03-10 (×8): qty 1

## 2015-03-10 MED ORDER — WITCH HAZEL-GLYCERIN EX PADS: 1.0000 "application " | MEDICATED_PAD | CUTANEOUS | Status: DC | PRN

## 2015-03-10 MED ORDER — BENZOCAINE-MENTHOL 20-0.5 % EX AERO: 1.0000 "application " | INHALATION_SPRAY | CUTANEOUS | Status: DC | PRN

## 2015-03-10 MED ORDER — OXYCODONE-ACETAMINOPHEN 5-325 MG PO TABS: 1.0000 | ORAL_TABLET | ORAL | Status: DC | PRN

## 2015-03-10 MED ORDER — LANOLIN HYDROUS EX OINT: TOPICAL_OINTMENT | CUTANEOUS | Status: DC | PRN

## 2015-03-10 MED ORDER — ONDANSETRON HCL 4 MG/2ML IJ SOLN: 4.0000 mg | INTRAMUSCULAR | Status: DC | PRN

## 2015-03-10 MED ORDER — DIBUCAINE 1 % RE OINT: 1.0000 "application " | TOPICAL_OINTMENT | RECTAL | Status: DC | PRN

## 2015-03-10 MED ORDER — SENNOSIDES-DOCUSATE SODIUM 8.6-50 MG PO TABS
2.0000 | ORAL_TABLET | ORAL | Status: DC
  Administered 2015-03-10 – 2015-03-12 (×2): 2 via ORAL
  Filled 2015-03-10 (×3): qty 2

## 2015-03-10 MED ORDER — SIMETHICONE 80 MG PO CHEW: 80.0000 mg | CHEWABLE_TABLET | ORAL | Status: DC | PRN

## 2015-03-10 MED ORDER — ONDANSETRON HCL 4 MG PO TABS: 4.0000 mg | ORAL_TABLET | ORAL | Status: DC | PRN

## 2015-03-10 MED ORDER — DIPHENHYDRAMINE HCL 25 MG PO CAPS
25.0000 mg | ORAL_CAPSULE | Freq: Four times a day (QID) | ORAL | Status: DC | PRN
  Administered 2015-03-10 – 2015-03-11 (×2): 25 mg via ORAL
  Filled 2015-03-10 (×2): qty 1

## 2015-03-10 MED ORDER — TETANUS-DIPHTH-ACELL PERTUSSIS 5-2.5-18.5 LF-MCG/0.5 IM SUSP: 0.5000 mL | Freq: Once | INTRAMUSCULAR | Status: DC

## 2015-03-10 MED ORDER — ZOLPIDEM TARTRATE 5 MG PO TABS: 5.0000 mg | ORAL_TABLET | Freq: Every evening | ORAL | Status: DC | PRN

## 2015-03-10 MED ORDER — PRENATAL MULTIVITAMIN CH
1.0000 | ORAL_TABLET | Freq: Every day | ORAL | Status: DC
Start: 2015-03-10 — End: 2015-03-12
  Administered 2015-03-10 – 2015-03-11 (×2): 1 via ORAL
  Filled 2015-03-10 (×2): qty 1

## 2015-03-10 MED ORDER — OXYCODONE-ACETAMINOPHEN 5-325 MG PO TABS: 2.0000 | ORAL_TABLET | ORAL | Status: DC | PRN

## 2015-03-10 NOTE — Anesthesia Postprocedure Evaluation (Signed)
  Anesthesia Post-op Note  Patient: Erin Wilkins  Procedure(s) Performed: * No procedures listed *  Patient Location: Mother/Baby  Anesthesia Type:Epidural  Level of Consciousness: awake, alert , oriented and patient cooperative  Airway and Oxygen Therapy: Patient Spontanous Breathing  Post-op Pain: none  Post-op Assessment: Post-op Vital signs reviewed, Patient's Cardiovascular Status Stable, Respiratory Function Stable, Patent Airway, No headache, No backache and Patient able to bend at knees              Post-op Vital Signs: Reviewed and stable  Last Vitals:  Filed Vitals:   03/10/15 0610  BP: 114/68  Pulse: 82  Temp: 37.2 C  Resp: 18    Complications: No apparent anesthesia complications

## 2015-03-10 NOTE — Progress Notes (Signed)
Lorelee MarketJada Vondra is a 19 y.o. G1P0 at 6384w4d  admitted for active labor  Subjective: Comfortable with epidural. Not feeling contractions. No pressure.   AROM performed @1825 , tolerated well.   Objective: Filed Vitals:   03/09/15 2231 03/09/15 2301 03/09/15 2330 03/10/15 0037  BP: 121/78 105/70 119/69   Pulse: 99 107 99   Temp:   98.7 F (37.1 C) 100.2 F (37.9 C)  TempSrc:   Oral Oral  Resp:   16 16  Height:      Weight:      SpO2:          FHT:  FHR: 130 bpm, variability: moderate,  accelerations:  Present,  decelerations:  Absent UC:   regular, every 2-3 minutes SVE:   Dilation: 10 Effacement (%): 100 Station: +1 Exam by:: shaw  Labs: Lab Results  Component Value Date   WBC 11.7* 03/09/2015   HGB 9.2* 03/09/2015   HCT 27.7* 03/09/2015   MCV 87.4 03/09/2015   PLT 261 03/09/2015    Assessment / Plan: Spontaneous labor, progressing normally  Labor: Progressing normally, s/p AROM Fetal Wellbeing:  Category I Pain Control:  Epidural in place Anticipated MOD:  NSVD  Jacquiline Doealeb Parker 03/10/2015, 12:45 AM

## 2015-03-10 NOTE — Lactation Note (Signed)
This note was copied from the chart of Erin Wilkins Privitera. Lactation Consultation Note: Mom eating lunch as I went into room. Reports she just tried breast feeding but baby wouldn't latch- just held nipple in his mouth. Reports she has had 2 good feedings earlier today. Several visitors present. BF brochure given with resources for support after DC. No questions at present. To call for assist prn  Patient Name: Erin Wilkins Vandam ZOXWR'UToday's Date: 03/10/2015 Reason for consult: Initial assessment   Maternal Data Formula Feeding for Exclusion: Yes Reason for exclusion: Mother's choice to formula and breast feed on admission Does the patient have breastfeeding experience prior to this delivery?: No  Feeding Feeding Type: Breast Fed Length of feed: 10 min  LATCH Score/Interventions                      Lactation Tools Discussed/Used     Consult Status Consult Status: Follow-up Date: 03/11/15 Follow-up type: In-patient    Pamelia HoitWeeks, Zacheriah Stumpe D 03/10/2015, 1:49 PM

## 2015-03-11 LAB — CBC
HCT: 22.8 % — ABNORMAL LOW (ref 36.0–46.0)
Hemoglobin: 7.8 g/dL — ABNORMAL LOW (ref 12.0–15.0)
MCH: 30.1 pg (ref 26.0–34.0)
MCHC: 34.2 g/dL (ref 30.0–36.0)
MCV: 88 fL (ref 78.0–100.0)
PLATELETS: 216 10*3/uL (ref 150–400)
RBC: 2.59 MIL/uL — AB (ref 3.87–5.11)
RDW: 16.5 % — AB (ref 11.5–15.5)
WBC: 13.1 10*3/uL — ABNORMAL HIGH (ref 4.0–10.5)

## 2015-03-11 MED ORDER — HYDROCORTISONE 1 % EX CREA
TOPICAL_CREAM | Freq: Two times a day (BID) | CUTANEOUS | Status: DC | PRN
  Administered 2015-03-11: 10:00:00 via TOPICAL
  Filled 2015-03-11: qty 28
  Filled 2015-03-11: qty 28.35

## 2015-03-11 NOTE — Lactation Note (Signed)
This note was copied from the chart of Erin Lorelee MarketJada Walsh. Lactation Consultation Note  Patient Name: Erin Wilkins ZOXWR'UToday's Date: 03/11/2015 Reason for consult: Follow-up assessment Family was sleeping, lactation to f/u when they wake.   Maternal Data    Feeding Feeding Type: Breast Fed Length of feed: 10 min  LATCH Score/Interventions Latch: Grasps breast easily, tongue down, lips flanged, rhythmical sucking. Intervention(s): Adjust position;Assist with latch;Breast massage;Breast compression  Audible Swallowing: A few with stimulation Intervention(s): Skin to skin  Type of Nipple: Everted at rest and after stimulation  Comfort (Breast/Nipple): Filling, red/small blisters or bruises, mild/mod discomfort  Problem noted: Mild/Moderate discomfort Interventions (Mild/moderate discomfort): Hand expression  Hold (Positioning): Assistance needed to correctly position infant at breast and maintain latch. Intervention(s): Breastfeeding basics reviewed;Support Pillows;Position options;Skin to skin  LATCH Score: 7  Lactation Tools Discussed/Used     Consult Status Consult Status: Follow-up Date: 03/12/15 Follow-up type: In-patient    Erin Wilkins 03/11/2015, 10:59 PM

## 2015-03-11 NOTE — Progress Notes (Signed)
Post Partum Day 1 Subjective:  Erin Wilkins is a 19 y.o. G1P1001 7160w5d s/p NSVD.  No acute events overnight.  Pt denies problems with ambulating, voiding or po intake.  She denies nausea or vomiting.  Pain is well controlled.  She has had flatus. She has not had bowel movement.  Lochia Small.  Plan for birth control is Depo-Provera.  Method of Feeding: Breast and Bottle  Objective: Blood pressure 99/56, pulse 74, temperature 98.5 F (36.9 C), temperature source Oral, resp. rate 16, height 5\' 1"  (1.549 m), weight 126 lb (57.153 kg), last menstrual period 05/31/2014, SpO2 99 %, unknown if currently breastfeeding.  Physical Exam:  General: alert, cooperative and no distress Lochia:normal flow Chest: CTAB Heart: RRR no m/r/g Abdomen: +BS, soft, nontender,  Uterine Fundus: firm, nontender DVT Evaluation: No evidence of DVT seen on physical exam. Extremities: No edema   Recent Labs  03/09/15 1255 03/11/15 0649  HGB 9.2* 7.8*  HCT 27.7* 22.8*    Assessment/Plan:  ASSESSMENT: Erin Wilkins is a 19 y.o. G1P1001 7160w5d s/p NSVD  Plan for discharge tomorrow, Breastfeeding and Lactation consult   LOS: 2 days   Jacquiline DoeCaleb Parker 03/11/2015, 7:53 AM   March 15, 2015  I have seen and examined this patient and agree the above assessment.  Respiratory effort normal, lochia appropriate, legs negative,  pain level normal.  CRESENZO-DISHMAN,Tamyra Fojtik 03/15/2015 11:10 AM

## 2015-03-12 MED ORDER — MEDROXYPROGESTERONE ACETATE 150 MG/ML IM SUSP
150.0000 mg | Freq: Once | INTRAMUSCULAR | Status: AC
  Administered 2015-03-12: 150 mg via INTRAMUSCULAR
  Filled 2015-03-12: qty 1

## 2015-03-12 MED ORDER — IBUPROFEN 600 MG PO TABS: 600.0000 mg | ORAL_TABLET | Freq: Four times a day (QID) | ORAL | Status: DC

## 2015-03-12 NOTE — Discharge Summary (Signed)
Obstetric Discharge Summary Reason for Admission: onset of labor Prenatal Procedures: none Intrapartum Procedures: spontaneous vaginal delivery Postpartum Procedures: none Complications-Operative and Postpartum: none  Hospital Course:  Erin Wilkins is a 19 year old G1P1 at 8619w4d gestational age who was admitted for active labor. Her labor progressed normally with no complications.   Delivery Note At 2:39 AM a viable female was delivered via Vaginal, Spontaneous Delivery (Presentation: ; Occiput Anterior). APGAR: 8, 9; weight pending .  Placenta status: Intact, Spontaneous. Cord: 3 vessels with the following complications: Note  Anesthesia: Epidural  Episiotomy: None Lacerations: None Est. Blood Loss (mL): 125  Mom to postpartum. Baby to Couplet care / Skin to Skin.  Erin Wilkins 03/10/2015, 3:32 AM  Postpartum, the patient did well with no complications.  On the day of discharge,  Pt denied problems with ambulating, voiding or po intake.  She denied nausea or vomiting.  Pain was well controlled.  She has had flatus. She has had bowel movement.  Lochia Small.  Plan for birth control is Depo-Provera.    H/H: Lab Results  Component Value Date/Time   HGB 7.8* 03/11/2015 06:49 AM   HGB 7.8* 01/27/2015 10:14 AM   HCT 22.8* 03/11/2015 06:49 AM    Filed Vitals:   03/12/15 0652  BP: 106/61  Pulse: 70  Temp: 98.9 F (37.2 C)  Resp: 18    Physical Exam: VSS NAD HEENT: Perioral rash noted, appears to be healing. Abd: Appropriately tender, ND, Fundus firm, below umbilicus No c/c/e, Neg homan's sign, neg cords Lochia Appropriate  Discharge Diagnoses: Term Pregnancy-delivered  Discharge Information: Date: 03/12/2015 Activity: pelvic rest Diet: routine  Medications: Ibuprofen Breast feeding:  Yes Condition: stable Instructions: refer to handout Discharge to: home       Discharge Instructions    Increase activity slowly    Complete by:  As directed              Medication List    TAKE these medications        [facility] PRENATAL GUMMY 0.4-113.5 MG Chew  Chew 1 tablet by mouth daily.     ferrous sulfate 325 (65 FE) MG tablet  Take 1 tablet (325 mg total) by mouth daily with breakfast.     ibuprofen 600 MG tablet  Commonly known as:  ADVIL,MOTRIN  Take 1 tablet (600 mg total) by mouth every 6 (six) hours.       Follow-up Information    Follow up with Erin Doealeb Parker, MD In 6 months.   Specialty:  Family Medicine   Why:  for post-partum check, or sooner if you have any concerns.    Contact information:   [address] [place] [address] [phone]       Erin Wilkins 03/12/2015,8:04 AM  OB fellow attestation I have seen and examined this patient and agree with above documentation in the resident's note.   Erin MarketJada Venus is a 19 y.o. G1P1001 s/p NSVD.   Pain is well controlled.  Plan for birth control is Depo-Provera.  Method of Feeding: Breast/Bottle  PE:  BP 106/61 mmHg  Pulse 70  Temp(Src) 98.9 F (37.2 C) (Oral)  Resp 18  Ht 5\' 1"  (1.549 m)  Wt 126 lb (57.153 kg)  BMI 23.82 kg/m2  SpO2 99%  LMP 05/31/2014 (Approximate)  Breastfeeding? Unknown Gen: well appearing Heart: reg rate Lungs: normal WOB Fundus firm Ext: soft, no pain, no edema   Recent Labs  03/09/15 1255 03/11/15 0649  HGB 9.2* 7.8*  HCT 27.7* 22.8*   Plan: discharge today - postpartum care discussed - f/u clinic in 6 weeks for postpartum visit - Agree with ferrous sulfate given drop in HGB - Added cephalosporins to patient's allergy list given perioral rash (which per patient is similar to her rash with PCN)  Federico Flake, MD 11:09 AM

## 2015-03-12 NOTE — Discharge Instructions (Signed)

## 2015-03-17 ENCOUNTER — Ambulatory Visit: Payer: Medicaid Other

## 2015-03-17 ENCOUNTER — Telehealth: Payer: Self-pay | Admitting: Family Medicine

## 2015-03-17 DIAGNOSIS — Z3402 Encounter for supervision of normal first pregnancy, second trimester: Secondary | ICD-10-CM

## 2015-03-17 DIAGNOSIS — D649 Anemia, unspecified: Secondary | ICD-10-CM

## 2015-03-17 LAB — POCT HEMOGLOBIN: HEMOGLOBIN: 10 g/dL — AB (ref 12.2–16.2)

## 2015-03-17 MED ORDER — CVS PRENATAL GUMMY 0.4-113.5 MG PO CHEW: 2.0000 | CHEWABLE_TABLET | Freq: Every day | ORAL | Status: DC

## 2015-03-17 NOTE — Telephone Encounter (Signed)
[facility] continue [facility] her [facility] since her iron level was low, [facility], says it needs to be something medicaid covers.

## 2015-03-17 NOTE — Telephone Encounter (Signed)
Will forward to MD.  We did check cbc today.  Jazmin Hartsell,CMA

## 2015-03-17 NOTE — Telephone Encounter (Signed)
Rx filled.  [name], MD [facility] Resident PGY-2 03/17/2015 4:10 PM

## 2015-03-18 LAB — CBC WITH DIFFERENTIAL/PLATELET
Basophils Absolute: 0.1 10*3/uL (ref 0.0–0.1)
Basophils Relative: 1 % (ref 0–1)
Eosinophils Absolute: 0 10*3/uL (ref 0.0–0.7)
Eosinophils Relative: 0 % (ref 0–5)
HCT: 31.2 % — ABNORMAL LOW (ref 36.0–46.0)
Hemoglobin: 10.2 g/dL — ABNORMAL LOW (ref 12.0–15.0)
Lymphocytes Relative: 29 % (ref 12–46)
Lymphs Abs: 2.5 10*3/uL (ref 0.7–4.0)
MCH: 28.6 pg (ref 26.0–34.0)
MCHC: 32.7 g/dL (ref 30.0–36.0)
MCV: 87.4 fL (ref 78.0–100.0)
MPV: 9.8 fL (ref 8.6–12.4)
Monocytes Absolute: 0.8 10*3/uL (ref 0.1–1.0)
Monocytes Relative: 9 % (ref 3–12)
Neutro Abs: 5.2 10*3/uL (ref 1.7–7.7)
Neutrophils Relative %: 61 % (ref 43–77)
Platelets: 437 10*3/uL — ABNORMAL HIGH (ref 150–400)
RBC: 3.57 MIL/uL — ABNORMAL LOW (ref 3.87–5.11)
RDW: 16 % — ABNORMAL HIGH (ref 11.5–15.5)
WBC: 8.6 10*3/uL (ref 4.0–10.5)

## 2015-03-22 ENCOUNTER — Encounter: Payer: Self-pay | Admitting: Family Medicine

## 2015-04-18 ENCOUNTER — Ambulatory Visit: Payer: Medicaid Other | Admitting: Family Medicine

## 2015-04-20 ENCOUNTER — Encounter: Payer: Self-pay | Admitting: Family Medicine

## 2015-04-20 ENCOUNTER — Ambulatory Visit (INDEPENDENT_AMBULATORY_CARE_PROVIDER_SITE_OTHER): Payer: Medicaid Other | Admitting: Family Medicine

## 2015-04-20 VITALS — BP 130/77 | HR 85 | Temp 98.2°F | Wt 104.0 lb

## 2015-04-20 DIAGNOSIS — N939 Abnormal uterine and vaginal bleeding, unspecified: Secondary | ICD-10-CM

## 2015-04-20 DIAGNOSIS — B354 Tinea corporis: Secondary | ICD-10-CM

## 2015-04-20 LAB — POCT HEMOGLOBIN: Hemoglobin: 11.8 g/dL — AB (ref 12.2–16.2)

## 2015-04-20 MED ORDER — NORGESTIMATE-ETH ESTRADIOL 0.25-35 MG-MCG PO TABS: 1.0000 | ORAL_TABLET | Freq: Every day | ORAL | Status: DC

## 2015-04-20 MED ORDER — NYSTATIN 100000 UNIT/GM EX CREA: 1.0000 "application " | TOPICAL_CREAM | Freq: Two times a day (BID) | CUTANEOUS | Status: DC

## 2015-04-20 NOTE — Progress Notes (Signed)
    Subjective:    Patient ID: Erin Wilkins is a 19 y.o. female presenting with Rash and Vaginal Bleeding  on 04/20/2015  HPI: 6 wks pp from a SVD. Was nursing but stopped due to rash on breasts. Rash is itchy and bothersome. Having bleeding continuously since discharge. Received Depo in hospital.  On Depo in past with similar bleeding profile.  Review of Systems  Constitutional: Negative for fever and chills.  Respiratory: Negative for shortness of breath.   Cardiovascular: Negative for chest pain.  Gastrointestinal: Negative for nausea, vomiting and abdominal pain.  Genitourinary: Negative for dysuria.  Skin: Negative for rash.      Objective:    BP 130/77 mmHg  Pulse 85  Temp(Src) 98.2 F (36.8 C) (Oral)  Wt 104 lb (47.174 kg)  Breastfeeding? No Physical Exam  Constitutional: She is oriented to person, place, and time. She appears well-developed and well-nourished. No distress.  HENT:  Head: Normocephalic and atraumatic.  Eyes: No scleral icterus.  Neck: Neck supple.  Cardiovascular: Normal rate.   Pulmonary/Chest: Effort normal.    Abdominal: Soft.  Neurological: She is alert and oriented to person, place, and time.  Skin: Skin is warm and dry.  Psychiatric: She has a normal mood and affect.        Assessment & Plan:   Problem List Items Addressed This Visit    None    Visit Diagnoses    Vaginal bleeding    -  Primary    Likley related ot Depo--1 pack OC's to help-desires Nexplanon--advised of bleeding issues and she is ok with this-put in at pp check.    Relevant Medications    norgestimate-ethinyl estradiol (ORTHO-CYCLEN,SPRINTEC,PREVIFEM) 0.25-35 MG-MCG tablet    Other Relevant Orders    Hemoglobin (Completed)    Tinea corporis        most likely with itching and location--trial of Nystatin    Relevant Medications    nystatin cream (MYCOSTATIN)          PRATT,TANYA S 04/20/2015 2:40 PM

## 2015-04-20 NOTE — Patient Instructions (Addendum)
Etonogestrel implant What is this medicine? ETONOGESTREL (et oh noe JES trel) is a contraceptive (birth control) device. It is used to prevent pregnancy. It can be used for up to 3 years. This medicine may be used for other purposes; ask your health care provider or pharmacist if you have questions. What should I tell my health care provider before I take this medicine? They need to know if you have any of these conditions: -abnormal vaginal bleeding -blood vessel disease or blood clots -cancer of the breast, cervix, or liver -depression -diabetes -gallbladder disease -headaches -heart disease or recent heart attack -high blood pressure -high cholesterol -kidney disease -liver disease -renal disease -seizures -tobacco smoker -an unusual or allergic reaction to etonogestrel, other hormones, anesthetics or antiseptics, medicines, foods, dyes, or preservatives -pregnant or trying to get pregnant -breast-feeding How should I use this medicine? This device is inserted just under the skin on the inner side of your upper arm by a health care professional. Talk to your pediatrician regarding the use of this medicine in children. Special care may be needed. Overdosage: If you think you have taken too much of this medicine contact a poison control center or emergency room at once. NOTE: This medicine is only for you. Do not share this medicine with others. What if I miss a dose? This does not apply. What may interact with this medicine? Do not take this medicine with any of the following medications: -amprenavir -bosentan -fosamprenavir This medicine may also interact with the following medications: -barbiturate medicines for inducing sleep or treating seizures -certain medicines for fungal infections like ketoconazole and itraconazole -griseofulvin -medicines to treat seizures like carbamazepine, felbamate, oxcarbazepine, phenytoin,  topiramate -modafinil -phenylbutazone -rifampin -some medicines to treat HIV infection like atazanavir, indinavir, lopinavir, nelfinavir, tipranavir, ritonavir -St. John's wort This list may not describe all possible interactions. Give your health care provider a list of all the medicines, herbs, non-prescription drugs, or dietary supplements you use. Also tell them if you smoke, drink alcohol, or use illegal drugs. Some items may interact with your medicine. What should I watch for while using this medicine? This product does not protect you against HIV infection (AIDS) or other sexually transmitted diseases. You should be able to feel the implant by pressing your fingertips over the skin where it was inserted. Contact your doctor if you cannot feel the implant, and use a non-hormonal birth control method (such as condoms) until your doctor confirms that the implant is in place. If you feel that the implant may have broken or become bent while in your arm, contact your healthcare provider. What side effects may I notice from receiving this medicine? Side effects that you should report to your doctor or health care professional as soon as possible: -allergic reactions like skin rash, itching or hives, swelling of the face, lips, or tongue -breast lumps -changes in emotions or moods -depressed mood -heavy or prolonged menstrual bleeding -pain, irritation, swelling, or bruising at the insertion site -scar at site of insertion -signs of infection at the insertion site such as fever, and skin redness, pain or discharge -signs of pregnancy -signs and symptoms of a blood clot such as breathing problems; changes in vision; chest pain; severe, sudden headache; pain, swelling, warmth in the leg; trouble speaking; sudden numbness or weakness of the face, arm or leg -signs and symptoms of liver injury like dark yellow or brown urine; general ill feeling or flu-like symptoms; light-colored stools; loss of  appetite; nausea; right upper belly   pain; unusually weak or tired; yellowing of the eyes or skin -unusual vaginal bleeding, discharge -signs and symptoms of a stroke like changes in vision; confusion; trouble speaking or understanding; severe headaches; sudden numbness or weakness of the face, arm or leg; trouble walking; dizziness; loss of balance or coordination Side effects that usually do not require medical attention (Report these to your doctor or health care professional if they continue or are bothersome.): -acne -back pain -breast pain -changes in weight -dizziness -general ill feeling or flu-like symptoms -headache -irregular menstrual bleeding -nausea -sore throat -vaginal irritation or inflammation This list may not describe all possible side effects. Call your doctor for medical advice about side effects. You may report side effects to FDA at 1-800-FDA-1088. Where should I keep my medicine? This drug is given in a hospital or clinic and will not be stored at home. NOTE: This sheet is a summary. It may not cover all possible information. If you have questions about this medicine, talk to your doctor, pharmacist, or health care provider.    2016, Elsevier/Gold Standard. (2014-02-05 14:07:06)  

## 2015-05-06 ENCOUNTER — Ambulatory Visit (INDEPENDENT_AMBULATORY_CARE_PROVIDER_SITE_OTHER): Payer: Medicaid Other | Admitting: Family Medicine

## 2015-05-06 ENCOUNTER — Other Ambulatory Visit (HOSPITAL_COMMUNITY)
Admission: RE | Admit: 2015-05-06 | Discharge: 2015-05-06 | Disposition: A | Discharge status: Home / Self Care | Payer: Medicaid Other | Source: Ambulatory Visit | Attending: Family Medicine | Admitting: Family Medicine

## 2015-05-06 VITALS — BP 128/81 | HR 98 | Temp 97.8°F | Ht 61.0 in | Wt 104.8 lb

## 2015-05-06 DIAGNOSIS — Z113 Encounter for screening for infections with a predominantly sexual mode of transmission: Secondary | ICD-10-CM | POA: Insufficient documentation

## 2015-05-06 DIAGNOSIS — N898 Other specified noninflammatory disorders of vagina: Secondary | ICD-10-CM | POA: Diagnosis present

## 2015-05-06 DIAGNOSIS — K5900 Constipation, unspecified: Secondary | ICD-10-CM | POA: Diagnosis not present

## 2015-05-06 LAB — POCT WET PREP (WET MOUNT)
Clue Cells Wet Prep Whiff POC: NEGATIVE
TRICHOMONAS WET PREP HPF POC: POSITIVE

## 2015-05-06 LAB — POCT URINALYSIS DIPSTICK
BILIRUBIN UA: NEGATIVE
GLUCOSE UA: NEGATIVE
NITRITE UA: NEGATIVE
Protein, UA: NEGATIVE
Spec Grav, UA: >=1.03
Urobilinogen, UA: 0.2
pH, UA: 6

## 2015-05-06 LAB — POCT UA - MICROSCOPIC ONLY: Trichomonas, UA: POSITIVE

## 2015-05-06 MED ORDER — FLUCONAZOLE 150 MG PO TABS: 150.0000 mg | ORAL_TABLET | Freq: Once | ORAL | Status: DC

## 2015-05-06 MED ORDER — POLYETHYLENE GLYCOL 3350 17 G PO PACK: 17.0000 g | PACK | Freq: Every day | ORAL | Status: DC

## 2015-05-06 MED ORDER — METRONIDAZOLE 500 MG PO TABS: 500.0000 mg | ORAL_TABLET | Freq: Two times a day (BID) | ORAL | Status: DC

## 2015-05-06 NOTE — Progress Notes (Signed)
   Subjective:    Patient ID: Erin Wilkins, female    DOB: [birth year], 19 y.o.   MRN: [id number]  Seen for Same day visit for   CC: Vaginal odor   Odor started just a few days ago. She took a Diflucan yesterday thinking it was a yeast infection as she had many's infections during her pregnancy Her symptoms have not resolved with a Diflucan. She does not have any dysuria, dyspareunia, abdominal pain or back pain. Having some discharge that is white in nature, but not very thick. She has a history of chlamydia infection She denies any fever, chills, or night sweats  Constipation  Delivered 6 weeks ago  She has had trouble passing stool and having to strain with each bowel movement  Was having to strain this morning  She hasn't tried anything for this.  Denies any nausea or vomiting.   Review of Systems   See HPI for ROS. Objective:  BP 128/81 mmHg  Pulse 98  Temp(Src) 97.8 F (36.6 C) (Oral)  Ht 5\' 1"  (1.549 m)  Wt 104 lb 12.8 oz (47.537 kg)  BMI 19.81 kg/m2  General: NAD Respiratory: normal effort Abdomen: soft, nontender, nondistended, no hepatic or splenomegaly. Bowel sounds present, no suprapubic tenderness Extremities:  WWP. Skin: warm and dry, no rashes noted Neuro: alert and oriented, no focal deficits GU: > External: no lesions > Vagina: no blood in vault     Assessment & Plan:   Vaginal discharge Urinalysis showing trichomonas Urinalysis also showing leukocytes - Urine culture pending - Flagyl 500 twice a day for 7 days - Gonorrhea and chlamydia pending - Counseled on safe sex practices and the importance of abstaining from her partner that may have trichomonas. - Advised that she inform partner and have them checked and treated as well  Constipation Has been occurring since the delivery of her child - Miralax was sent in

## 2015-05-06 NOTE — Assessment & Plan Note (Addendum)
Urinalysis showing trichomonas Urinalysis also showing leukocytes - Urine culture pending - Flagyl 500 twice a day for 7 days - Gonorrhea and chlamydia pending - Counseled on safe sex practices and the importance of abstaining from her partner that may have trichomonas. - Advised that she inform partner and have them checked and treated as well

## 2015-05-06 NOTE — Assessment & Plan Note (Signed)
Has been occurring since the delivery of her child - Miralax was sent in

## 2015-05-06 NOTE — Patient Instructions (Signed)
Thank you for coming in,   I will call or send a letter with the results from today.  Take 1 spoonful of Miralax daily until your bowel movement is soft. He may increase this a quarter of a cup per day until you reach a max of 2 spoonfuls or the desired softness of the stool.  Sign up for My Chart to have easy access to your labs results, and communication with your Primary care physician   Please feel free to call with any questions or concerns at any time, at [phone]. --Dr. Jordan Likes Constipation, Adult Constipation is when a person has fewer than three bowel movements a week, has difficulty having a bowel movement, or has stools that are dry, hard, or larger than normal. As people grow older, constipation is more common. A low-fiber diet, not taking in enough fluids, and taking certain medicines may make constipation worse.  CAUSES   Certain medicines, such as antidepressants, pain medicine, iron supplements, antacids, and water pills.   Certain diseases, such as diabetes, irritable bowel syndrome (IBS), thyroid disease, or depression.   Not drinking enough water.   Not eating enough fiber-rich foods.   Stress or travel.   Lack of physical activity or exercise.   Ignoring the urge to have a bowel movement.   Using laxatives too much.  SIGNS AND SYMPTOMS   Having fewer than three bowel movements a week.   Straining to have a bowel movement.   Having stools that are hard, dry, or larger than normal.   Feeling full or bloated.   Pain in the lower abdomen.   Not feeling relief after having a bowel movement.  DIAGNOSIS  Your health care provider will take a medical history and perform a physical exam. Further testing may be done for severe constipation. Some tests may include:  A barium enema X-ray to examine your rectum, colon, and, sometimes, your small intestine.   A sigmoidoscopy to examine your lower colon.   A colonoscopy to examine your entire  colon. TREATMENT  Treatment will depend on the severity of your constipation and what is causing it. Some dietary treatments include drinking more fluids and eating more fiber-rich foods. Lifestyle treatments may include regular exercise. If these diet and lifestyle recommendations do not help, your health care provider may recommend taking over-the-counter laxative medicines to help you have bowel movements. Prescription medicines may be prescribed if over-the-counter medicines do not work.  HOME CARE INSTRUCTIONS   Eat foods that have a lot of fiber, such as fruits, vegetables, whole grains, and beans.  Limit foods high in fat and processed sugars, such as french fries, hamburgers, cookies, candies, and soda.   A fiber supplement may be added to your diet if you cannot get enough fiber from foods.   Drink enough fluids to keep your urine clear or pale yellow.   Exercise regularly or as directed by your health care provider.   Go to the restroom when you have the urge to go. Do not hold it.   Only take over-the-counter or prescription medicines as directed by your health care provider. Do not take other medicines for constipation without talking to your health care provider first.  SEEK IMMEDIATE MEDICAL CARE IF:   You have bright red blood in your stool.   Your constipation lasts for more than 4 days or gets worse.   You have abdominal or rectal pain.   You have thin, pencil-like stools.   You have unexplained weight loss.  MAKE SURE YOU:   Understand these instructions.  Will watch your condition.  Will get help right away if you are not doing well or get worse.   This information is not intended to replace advice given to you by your health care provider. Make sure you discuss any questions you have with your health care provider.   Document Released: 01/20/2004 Document Revised: 05/14/2014 Document Reviewed: 02/02/2013 Elsevier Interactive Patient Education [email or web address]  Elsevier Inc.

## 2015-05-10 ENCOUNTER — Telehealth: Payer: Self-pay | Admitting: Family Medicine

## 2015-05-10 LAB — URINE CULTURE

## 2015-05-10 NOTE — Telephone Encounter (Signed)
Will forward to Dr. Schmitz. Erin Wilkins,CMA  

## 2015-05-10 NOTE — Telephone Encounter (Signed)
Would like to know if she has an infection

## 2015-05-11 LAB — CERVICOVAGINAL ANCILLARY ONLY
Chlamydia: NEGATIVE
Neisseria Gonorrhea: NEGATIVE

## 2015-05-12 MED ORDER — SULFAMETHOXAZOLE-TRIMETHOPRIM 800-160 MG PO TABS: 1.0000 | ORAL_TABLET | Freq: Two times a day (BID) | ORAL | Status: DC

## 2015-05-12 NOTE — Telephone Encounter (Signed)
Left VM for patient. If she calls back please have her speak with a nurse/CMA and discuss that her urine culture showed an infection I have in antibiotics for this. This will be a different antibiotic than the one that she is already receiving. Her gonorrhea and chlamydia is negative.    If any questions then please take the best time and phone number to call and I will try to call her back.   Myra RudeJeremy E Ashlin Kreps, MD PGY-3, [facility] 05/12/2015, 8:21 AM

## 2015-05-13 ENCOUNTER — Ambulatory Visit (INDEPENDENT_AMBULATORY_CARE_PROVIDER_SITE_OTHER): Payer: Medicaid Other | Admitting: Family Medicine

## 2015-05-13 ENCOUNTER — Encounter: Payer: Self-pay | Admitting: Family Medicine

## 2015-05-13 VITALS — BP 128/71 | HR 73 | Temp 98.1°F | Wt 96.5 lb

## 2015-05-13 DIAGNOSIS — Z30019 Encounter for initial prescription of contraceptives, unspecified: Secondary | ICD-10-CM

## 2015-05-13 DIAGNOSIS — Z3040 Encounter for surveillance of contraceptives, unspecified: Secondary | ICD-10-CM | POA: Diagnosis not present

## 2015-05-13 DIAGNOSIS — Z30018 Encounter for initial prescription of other contraceptives: Secondary | ICD-10-CM

## 2015-05-13 LAB — POCT URINE PREGNANCY: Preg Test, Ur: NEGATIVE

## 2015-05-13 MED ORDER — ETONOGESTREL 68 MG ~~LOC~~ IMPL
68.0000 mg | DRUG_IMPLANT | Freq: Once | SUBCUTANEOUS | Status: AC
  Administered 2015-05-13: 68 mg via SUBCUTANEOUS

## 2015-05-13 NOTE — Patient Instructions (Signed)
I am glad everything is going well. We placed your nexplanon today. Please come back in 1 year for your annual visit, or sooner if you need anything else.  Take care,  Dr Jimmey Ralph  Etonogestrel implant What is this medicine? ETONOGESTREL (et oh noe JES trel) is a contraceptive (birth control) device. It is used to prevent pregnancy. It can be used for up to 3 years. This medicine may be used for other purposes; ask your health care provider or pharmacist if you have questions. What should I tell my health care provider before I take this medicine? They need to know if you have any of these conditions: -abnormal vaginal bleeding -blood vessel disease or blood clots -cancer of the breast, cervix, or liver -depression -diabetes -gallbladder disease -headaches -heart disease or recent heart attack -high blood pressure -high cholesterol -kidney disease -liver disease -renal disease -seizures -tobacco smoker -an unusual or allergic reaction to etonogestrel, other hormones, anesthetics or antiseptics, medicines, foods, dyes, or preservatives -pregnant or trying to get pregnant -breast-feeding How should I use this medicine? This device is inserted just under the skin on the inner side of your upper arm by a health care professional. Talk to your pediatrician regarding the use of this medicine in children. Special care may be needed. Overdosage: If you think you have taken too much of this medicine contact a poison control center or emergency room at once. NOTE: This medicine is only for you. Do not share this medicine with others. What if I miss a dose? This does not apply. What may interact with this medicine? Do not take this medicine with any of the following medications: -amprenavir -bosentan -fosamprenavir This medicine may also interact with the following medications: -barbiturate medicines for inducing sleep or treating seizures -certain medicines for fungal infections like  ketoconazole and itraconazole -griseofulvin -medicines to treat seizures like carbamazepine, felbamate, oxcarbazepine, phenytoin, topiramate -modafinil -phenylbutazone -rifampin -some medicines to treat HIV infection like atazanavir, indinavir, lopinavir, nelfinavir, tipranavir, ritonavir -St. John's wort This list may not describe all possible interactions. Give your health care provider a list of all the medicines, herbs, non-prescription drugs, or dietary supplements you use. Also tell them if you smoke, drink alcohol, or use illegal drugs. Some items may interact with your medicine. What should I watch for while using this medicine? This product does not protect you against HIV infection (AIDS) or other sexually transmitted diseases. You should be able to feel the implant by pressing your fingertips over the skin where it was inserted. Contact your doctor if you cannot feel the implant, and use a non-hormonal birth control method (such as condoms) until your doctor confirms that the implant is in place. If you feel that the implant may have broken or become bent while in your arm, contact your healthcare provider. What side effects may I notice from receiving this medicine? Side effects that you should report to your doctor or health care professional as soon as possible: -allergic reactions like skin rash, itching or hives, swelling of the face, lips, or tongue -breast lumps -changes in emotions or moods -depressed mood -heavy or prolonged menstrual bleeding -pain, irritation, swelling, or bruising at the insertion site -scar at site of insertion -signs of infection at the insertion site such as fever, and skin redness, pain or discharge -signs of pregnancy -signs and symptoms of a blood clot such as breathing problems; changes in vision; chest pain; severe, sudden headache; pain, swelling, warmth in the leg; trouble speaking; sudden numbness  or weakness of the face, arm or leg -signs  and symptoms of liver injury like dark yellow or brown urine; general ill feeling or flu-like symptoms; light-colored stools; loss of appetite; nausea; right upper belly pain; unusually weak or tired; yellowing of the eyes or skin -unusual vaginal bleeding, discharge -signs and symptoms of a stroke like changes in vision; confusion; trouble speaking or understanding; severe headaches; sudden numbness or weakness of the face, arm or leg; trouble walking; dizziness; loss of balance or coordination Side effects that usually do not require medical attention (Report these to your doctor or health care professional if they continue or are bothersome.): -acne -back pain -breast pain -changes in weight -dizziness -general ill feeling or flu-like symptoms -headache -irregular menstrual bleeding -nausea -sore throat -vaginal irritation or inflammation This list may not describe all possible side effects. Call your doctor for medical advice about side effects. You may report side effects to FDA at 1-800-FDA-1088. Where should I keep my medicine? This drug is given in a hospital or clinic and will not be stored at home. NOTE: This sheet is a summary. It may not cover all possible information. If you have questions about this medicine, talk to your doctor, pharmacist, or health care provider.    2016, Elsevier/Gold Standard. (2014-02-05 14:07:06)

## 2015-05-13 NOTE — Addendum Note (Signed)
Addended by: Jone BasemanFLEEGER, Kessie Croston D on: 05/13/2015 03:40 PM   Modules accepted: Orders

## 2015-05-13 NOTE — Progress Notes (Addendum)
    Subjective:  Erin MarketJada Wilkins is a 20 y.o. female who [facility] with a chief complaint of post partum check and birth control counseling.   HPI:  Post Partum Check Patient delivered on 03/10/2015 without any complication. Her postpartum course has been normal aside from vaginal bleeding due to her depo shot. Mood has been good. No feelings of depression. No HI towards her baby. Has good support at home with her mother and FOB. No abdominal pain.   Birth Control Patient expresses desire to have nexplanon placed. She has been on depo previously and tolerated well aside from some light bleeding. She is currently having mild spotting.   ROS: Per HPI  Objective:  Physical Exam: BP 128/71 mmHg  Pulse 73  Temp(Src) 98.1 F (36.7 C) (Oral)  Wt 96 lb 8 oz (43.772 kg)  LMP 04/09/2015  Breastfeeding? No  Gen: NAD, resting comfortably CV: RRR with no murmurs appreciated Lungs: NWOB, CTAB with no crackles, wheezes, or rhonchi GI: Normal bowel sounds present. Soft, Nontender, Nondistended. MSK: no edema, cyanosis, or clubbing noted Skin: warm, dry Psych: Normal affect and thought content. No SI or HI  Procedure Note: Patient given informed consent, signed copy in the chart, time out was performed. Pregnancy test was negative. Appropriate time out taken.  Patient's left arm was prepped and draped in the usual sterile fashion.. The ruler used to measure and mark insertion area.  Pt was prepped with alcohol swab and then injected with 6 cc of 1% lidocaine with epinephrine.  Pt was prepped with betadine, nexplanon removed form packaging,  Device confirmed in needle, then inserted full length of needle and withdrawn per handbook instructions.  Pt insertion site covered with steri strip.   Minimal blood loss.  Pt tolerated the procedure well.    Assessment/Plan:  Post Partum Check Doing well. No concerns. Follow up as needed.   Nexplanon Insertion UPT here negative. See above  procedure note for details. Follow up next year for annual exam.   Erin Wilkins RalphParker, MD [facility] Resident PGY-2 05/13/2015 1:53 PM

## 2015-08-03 ENCOUNTER — Encounter: Payer: Self-pay | Admitting: Family Medicine

## 2015-08-03 ENCOUNTER — Ambulatory Visit (INDEPENDENT_AMBULATORY_CARE_PROVIDER_SITE_OTHER): Payer: Medicaid Other | Admitting: Family Medicine

## 2015-08-03 VITALS — BP 110/73 | HR 72 | Temp 98.4°F | Ht 61.0 in | Wt 100.6 lb

## 2015-08-03 DIAGNOSIS — K5900 Constipation, unspecified: Secondary | ICD-10-CM

## 2015-08-03 DIAGNOSIS — R109 Unspecified abdominal pain: Secondary | ICD-10-CM | POA: Diagnosis present

## 2015-08-03 LAB — POCT URINALYSIS DIPSTICK
BILIRUBIN UA: NEGATIVE
Glucose, UA: NEGATIVE
KETONES UA: NEGATIVE
LEUKOCYTES UA: NEGATIVE
Nitrite, UA: NEGATIVE
PH UA: 6.5
Protein, UA: NEGATIVE
Spec Grav, UA: 1.025
Urobilinogen, UA: 1

## 2015-08-03 MED ORDER — SENNOSIDES-DOCUSATE SODIUM 8.6-50 MG PO TABS: 2.0000 | ORAL_TABLET | Freq: Every evening | ORAL | Status: DC | PRN

## 2015-08-03 MED ORDER — POLYETHYLENE GLYCOL 3350 17 GM/SCOOP PO POWD: 17.0000 g | Freq: Every day | ORAL | Status: DC

## 2015-08-03 NOTE — Patient Instructions (Signed)

## 2015-08-03 NOTE — Assessment & Plan Note (Signed)
Likely the etiology of the abdominal cramping Start Mira lax daily and increase as needed to titrate to one soft bowel movement daily Senna and Colace as needed for moderate constipation Follow-up as needed

## 2015-08-03 NOTE — Assessment & Plan Note (Signed)
UA with moderate blood, but this is likely vaginal bleeding No symptoms of UTI, PID, cervicitis Patient refuses GYN exam at this time Likely related to constipation

## 2015-08-03 NOTE — Progress Notes (Signed)
   Subjective:   Erin Wilkins is a 20 y.o. female with a history of allergic rhinitis, constipation here for possible UTI.  Was diagnosed with UTI 12/30. Patient prescribed Bactrim and took all but last pill.  She was wondering if this could mean that she has a UTI that was inadequately treated.  Current symptoms include abdominal cramping in mid-abd, mild suprapubic pain occasionally after finishing urination.She denies any urgency, frequency, burning with urination. She reports that she has not had a bowel movement in 2 weeks. She took [place] lax yesterday and this did not produce a bowel movement. She reports she will regularly go up to a week without a bowel movement and has large and hard to pass stools. She does not take anything regularly for constipation.  Does report current vaginal bleeding without any discharge.  Denies any hematochezia or melena or nausea, vomiting.  Review of Systems:  Per HPI.   Social History: never smoker  Objective:  BP 110/73 mmHg  Pulse 72  Temp(Src) 98.4 F (36.9 C) (Oral)  Ht 5\' 1"  (1.549 m)  Wt 100 lb 9.6 oz (45.632 kg)  BMI 19.02 kg/m2  Gen:  20 y.o. female in NAD HEENT: NCAT, MMM, EOMI, PERRL, anicteric sclerae CV: RRR, no MRG Resp: Non-labored, CTAB, no wheezes noted Abd: Soft, NTND, BS present, no guarding or organomegaly, rectal exam with normal tone, no gross blood, no impaction and no hemorrhoids Ext: WWP, no edema MSK: No obvious deformities Neuro: Alert and oriented, speech normal   Assessment & Plan:     Erin Wilkins is a 20 y.o. female here for Constipation and abdominal cramping  Abdominal cramping UA with moderate blood, but this is likely vaginal bleeding No symptoms of UTI, PID, cervicitis Patient refuses GYN exam at this time Likely related to constipation  Constipation Likely the etiology of the abdominal cramping Start Mira lax daily and increase as needed to titrate to one soft bowel movement daily Senna and Colace as needed  for moderate constipation Follow-up as needed    Erasmo DownerAngela M Adhira Jamil, MD MPH PGY-2,  [facility] 08/03/2015  11:47 AM

## 2015-09-28 ENCOUNTER — Encounter: Payer: Self-pay | Admitting: Family Medicine

## 2015-09-28 ENCOUNTER — Other Ambulatory Visit (HOSPITAL_COMMUNITY)
Admission: RE | Admit: 2015-09-28 | Discharge: 2015-09-28 | Disposition: A | Discharge status: Home / Self Care | Payer: Medicaid Other | Source: Ambulatory Visit | Attending: Family Medicine | Admitting: Family Medicine

## 2015-09-28 ENCOUNTER — Ambulatory Visit (INDEPENDENT_AMBULATORY_CARE_PROVIDER_SITE_OTHER): Payer: Medicaid Other | Admitting: Family Medicine

## 2015-09-28 VITALS — BP 118/83 | HR 58 | Temp 98.6°F | Wt 98.6 lb

## 2015-09-28 DIAGNOSIS — Z113 Encounter for screening for infections with a predominantly sexual mode of transmission: Secondary | ICD-10-CM | POA: Diagnosis not present

## 2015-09-28 DIAGNOSIS — L298 Other pruritus: Secondary | ICD-10-CM

## 2015-09-28 DIAGNOSIS — Z130 Encounter for screening for diseases of the blood and blood-forming organs and certain disorders involving the immune mechanism: Secondary | ICD-10-CM | POA: Diagnosis not present

## 2015-09-28 DIAGNOSIS — N898 Other specified noninflammatory disorders of vagina: Secondary | ICD-10-CM

## 2015-09-28 LAB — CBC WITH DIFFERENTIAL/PLATELET
BASOS PCT: 0 %
Basophils Absolute: 0 cells/uL (ref 0–200)
EOS ABS: 80 {cells}/uL (ref 15–500)
Eosinophils Relative: 1 %
HEMATOCRIT: 38.2 % (ref 35.0–45.0)
HEMOGLOBIN: 12.7 g/dL (ref 11.7–15.5)
Lymphocytes Relative: 38 %
Lymphs Abs: 3040 cells/uL (ref 850–3900)
MCH: 29.7 pg (ref 27.0–33.0)
MCHC: 33.2 g/dL (ref 32.0–36.0)
MCV: 89.5 fL (ref 80.0–100.0)
MONO ABS: 400 {cells}/uL (ref 200–950)
MPV: 11.4 fL (ref 7.5–12.5)
Monocytes Relative: 5 %
NEUTROS ABS: 4480 {cells}/uL (ref 1500–7800)
Neutrophils Relative %: 56 %
Platelets: 362 10*3/uL (ref 140–400)
RBC: 4.27 MIL/uL (ref 3.80–5.10)
RDW: 14.4 % (ref 11.0–15.0)
WBC: 8 10*3/uL (ref 3.8–10.8)

## 2015-09-28 LAB — POCT WET PREP (WET MOUNT): Clue Cells Wet Prep Whiff POC: NEGATIVE

## 2015-09-28 NOTE — Progress Notes (Signed)
   Subjective:    Patient ID: Erin Wilkins, female    DOB: [birth year], 20 y.o.   MRN: [id number]  Seen for Same day visit for   CC: vaginal itching  VAGINAL DISCHARGE  Having vaginal discharge for 3 days. Medications tried: none Discharge consistency: thin Discharge color: white Recent antibiotic use: no Sex in last month: yes Possible STD exposure: yes  Symptoms Fever: no Dysuria:no Vaginal bleeding: no Abdomen or Pelvic pain: no Back pain: no Genital sores or ulcers:no Rash: no Pain during sex: no Missed menstrual period: no; Reports almost daily.  Irregular vaginal bleeding since having Nexplanon placed.  Denies chest pain, shortness of breath, palpitations, dizziness.  History of anemia  ROS see HPI Smoking Status noted  Objective:  BP 118/83 mmHg  Pulse 58  Temp(Src) 98.6 F (37 C) (Oral)  Wt 98 lb 9.6 oz (44.725 kg)  SpO2 100%  LMP  (LMP Unknown)  General: NAD Speculum Exam: Ext genitalia: wnl; Vaginal discharge: minimal serous sanguinous; Cervix: wnl Bimanual Exam: No Cervical motion tenderness; No Vaginal wall defects; Adnexa nontender   Assessment & Plan:   1. Vaginal itching - POCT Wet Prep [facility] [place]); patient requests to be called with results letter to be sent to house - Cervicovaginal ancillary only  2. Screening for deficiency anemia - CBC with Differential

## 2015-09-29 LAB — CERVICOVAGINAL ANCILLARY ONLY
Chlamydia: NEGATIVE
Neisseria Gonorrhea: NEGATIVE

## 2015-10-05 ENCOUNTER — Telehealth: Payer: Self-pay | Admitting: Family Medicine

## 2015-10-05 ENCOUNTER — Telehealth: Payer: Self-pay | Admitting: *Deleted

## 2015-10-05 MED ORDER — FLUCONAZOLE 150 MG PO TABS: 150.0000 mg | ORAL_TABLET | Freq: Every day | ORAL | Status: DC

## 2015-10-05 NOTE — Telephone Encounter (Signed)
Wet prep negative but symptoms persist. Will treat symptomatically and reassess if continues to have itching after treatment.

## 2015-10-05 NOTE — Telephone Encounter (Signed)
Pt informed of rx sent to pharmacy and to call back in and make an appt if meds dont work. Rebel Laughridge Bruna PotterBlount, CMA

## 2015-10-05 NOTE — Telephone Encounter (Signed)
Pt was seen for a possible UTI/Yeast but the test didn't show anything. She is still having the symptoms and would like the doctor to call in something for this. jw

## 2015-10-05 NOTE — Telephone Encounter (Signed)
Patient called back and was given information by deseree blount, cma. Jazmin Hartsell,CMA

## 2015-10-05 NOTE — Telephone Encounter (Signed)
Pt saw you on 5/24 for vaginal itching, all test negative. Please advise.

## 2015-10-05 NOTE — Telephone Encounter (Signed)
LM for patient to call back. Thurmon Mizell,CMA  

## 2015-10-07 ENCOUNTER — Ambulatory Visit: Payer: Medicaid Other | Admitting: Family Medicine

## 2015-10-10 NOTE — Telephone Encounter (Signed)
Spoke with pharmacy and they informed me that medication was covered by insurance and there was no charge for the patient.  I spoke with Mel AlmondJada and she is aware of this. Jazmin Hartsell,CMA

## 2015-10-10 NOTE — Telephone Encounter (Signed)
Patient states that medicaid doesn't cover the medicaid doesn't cover medication sent in for yeast. Would like MD to call in something medicaid covers.

## 2015-11-17 ENCOUNTER — Encounter: Payer: Self-pay | Admitting: Family Medicine

## 2015-11-17 ENCOUNTER — Ambulatory Visit (INDEPENDENT_AMBULATORY_CARE_PROVIDER_SITE_OTHER): Payer: Medicaid Other | Admitting: Family Medicine

## 2015-11-17 VITALS — BP 119/70 | HR 93 | Temp 98.1°F | Ht 61.0 in | Wt 93.4 lb

## 2015-11-17 DIAGNOSIS — L290 Pruritus ani: Secondary | ICD-10-CM

## 2015-11-17 MED ORDER — POLYETHYLENE GLYCOL 3350 17 GM/SCOOP PO POWD: 17.0000 g | Freq: Two times a day (BID) | ORAL | Status: DC | PRN

## 2015-11-17 MED ORDER — ZINC OXIDE 20 % EX OINT: 1.0000 "application " | TOPICAL_OINTMENT | Freq: Two times a day (BID) | CUTANEOUS | Status: DC

## 2015-11-17 NOTE — Patient Instructions (Addendum)
Thank you so much for coming to visit today! I believe your symptoms are due to Pruritus Ani, which can be worsened by constipation. I have given you a prescription for a cream for you to use twice daily for 1-2 weeks. I have also sent a prescription for Miralax to the pharmacy to help with the constipation. Please let me know if symptoms worsen or fail to improve.  Thanks again! Dr. Caroleen Hammanumley

## 2015-11-17 NOTE — Progress Notes (Signed)
Subjective:     Patient ID: Lorelee MarketJada Cuccaro, female   DOB: 07-14-1995, 20 y.o.   MRN: [id number]  HPI Mrs. Donavan FoilBass is a 20yo female presenting for rectal itching. - Reports itching x4 days - Occurs intermittently. Worse with walking. Denies worsening at night. - LMP last week - Reports chronic history of constipation. Normally has one bowel movement a week. Last bowel movement last week. - Denies fever, rash, vaginal discharge, vaginal itching - Nonsmoker  Review of Systems Per HPI. Other systems negative.    Objective:   Physical Exam  Constitutional: She appears well-developed and well-nourished. No distress.  Cardiovascular: Normal rate.   No murmur heard. Pulmonary/Chest: Effort normal. No respiratory distress. She has no wheezes.  Genitourinary:  Rectal exam without hemorrhoids noted, no signs of fissure, stool in rectal vault. Anoscopy with stool visualized, no signs of hemorrhoids.  Skin: No rash noted.      Assessment and Plan:     Pruritus ani - No hemorrhoids or fissures noted. Suspect worsened by history of constipation. - Prescription for Miralax given - Prescription for Hydrocortisone-Nystatin-Zinc Oxide given to use over next 1-2 weeks. Would not extend past 2 weeks. - Handout given - Follow up if symptoms worsen or fail to resolve

## 2015-11-17 NOTE — Assessment & Plan Note (Addendum)
-   No hemorrhoids or fissures noted. Suspect worsened by history of constipation. - Prescription for Miralax given - Prescription for Hydrocortisone-Nystatin-Zinc Oxide given to use over next 1-2 weeks. Would not extend past 2 weeks. - Handout given - Follow up if symptoms worsen or fail to resolve

## 2015-11-23 ENCOUNTER — Other Ambulatory Visit: Payer: Self-pay | Admitting: Family Medicine

## 2015-11-23 NOTE — Telephone Encounter (Signed)
Will forward to MD to advise. Clarke Peretz,CMA  

## 2015-11-23 NOTE — Telephone Encounter (Signed)
Left message for pt to call and schedule an apt. Page, cma.

## 2015-11-23 NOTE — Telephone Encounter (Signed)
Pt said she was given pills to stop bleeding in the past when on depo, she is now on nexplanon and bleeding heavily. She would like to get the pills prescribed again. If she needs an appointment or if medication is called in please let pt know. ep

## 2015-11-23 NOTE — Telephone Encounter (Signed)
Please ask the patient to schedule a follow up appointment.  [name], MD [facility] Resident PGY-3 11/23/2015 1:39 PM

## 2015-11-23 NOTE — Telephone Encounter (Signed)
LM for patient to call back. Jazmin Hartsell,CMA  

## 2015-11-24 NOTE — Telephone Encounter (Signed)
LM for patient to call back.  Will schedule an appt for her irregular bleeding once she does. Jazmin Hartsell,CMA

## 2015-12-05 ENCOUNTER — Ambulatory Visit: Payer: Medicaid Other | Admitting: Family Medicine

## 2015-12-07 ENCOUNTER — Ambulatory Visit (INDEPENDENT_AMBULATORY_CARE_PROVIDER_SITE_OTHER): Payer: Medicaid Other | Admitting: Family Medicine

## 2015-12-07 ENCOUNTER — Other Ambulatory Visit (HOSPITAL_COMMUNITY)
Admission: RE | Admit: 2015-12-07 | Discharge: 2015-12-07 | Disposition: A | Discharge status: Home / Self Care | Payer: Medicaid Other | Source: Ambulatory Visit | Attending: Family Medicine | Admitting: Family Medicine

## 2015-12-07 ENCOUNTER — Encounter: Payer: Self-pay | Admitting: Family Medicine

## 2015-12-07 VITALS — BP 114/90 | HR 83 | Temp 98.2°F | Wt 91.0 lb

## 2015-12-07 DIAGNOSIS — Z113 Encounter for screening for infections with a predominantly sexual mode of transmission: Secondary | ICD-10-CM | POA: Insufficient documentation

## 2015-12-07 DIAGNOSIS — N939 Abnormal uterine and vaginal bleeding, unspecified: Secondary | ICD-10-CM

## 2015-12-07 DIAGNOSIS — Z202 Contact with and (suspected) exposure to infections with a predominantly sexual mode of transmission: Secondary | ICD-10-CM | POA: Diagnosis not present

## 2015-12-07 LAB — POCT URINE PREGNANCY: Preg Test, Ur: NEGATIVE

## 2015-12-07 MED ORDER — CELECOXIB 100 MG PO CAPS: 100.0000 mg | ORAL_CAPSULE | Freq: Two times a day (BID) | ORAL | 1 refills | Status: DC

## 2015-12-07 NOTE — Progress Notes (Signed)
   Subjective:  Erin Wilkins is a 20 y.o. female who presents to the [facility] today with a chief complaint of irregular vaginal bleeding.   HPI:  Irregular Vaginal Bleeding Patient had nexplanon placed 7 months ago. Since then she has noticed irregular vaginal bleeding. She will have bleeding for a couple months, then stop for a couple months. It is usually light or spotting, though she occasionally has heavy bleeding. No abdominal pain or cramps. No lightheadedness, shortness of breath, or chest pain. Had a similar presentation on depo that improved after she was started on OCPs.   ROS: Per HPI  Objective:  Physical Exam: BP 114/90   Pulse 83   Temp 98.2 F (36.8 C) (Oral)   Wt 91 lb (41.3 kg)   LMP 10/14/2015   SpO2 96%   BMI 17.19 kg/m   Gen: NAD, resting comfortably CV: RRR with no murmurs appreciated Pulm: NWOB, CTAB with no crackles, wheezes, or rhonchi GI: Normal bowel sounds present. Soft, Nontender, Nondistended. MSK: no edema, cyanosis, or clubbing noted Skin: warm, dry Neuro: grossly normal, moves all extremities Psych: Normal affect and thought content  Results for orders placed or performed in visit on 12/07/15 (from the past 72 hour(s))  POCT urine pregnancy     Status: None   Collection Time: 12/07/15  2:01 PM  Result Value Ref Range   Preg Test, Ur Negative Negative   Assessment/Plan:  Abnormal vaginal bleeding Likely related to nexplanon. Discussed with patient that irregular bleeding usually resolves within the first year of placement. Patient does not want to have nexplanon removed. Urine pregnancy negative. Normal Hgb 2 months ago - will not repeat as patient does not have symptoms of anemia. Will try 1 week course of NSAIDs to see if it helps. If not, will try OCPs - she had a similar reaction in the past to depo which improved with OCPs.   Katina Degree. Jimmey Ralph, MD [facility] Resident PGY-3 12/07/2015 3:12 PM

## 2015-12-07 NOTE — Assessment & Plan Note (Signed)
Likely related to nexplanon. Discussed with patient that irregular bleeding usually resolves within the first year of placement. Patient does not want to have nexplanon removed. Urine pregnancy negative. Normal Hgb 2 months ago - will not repeat as patient does not have symptoms of anemia. Will try 1 week course of NSAIDs to see if it helps. If not, will try OCPs - she had a similar reaction in the past to depo which improved with OCPs.

## 2015-12-07 NOTE — Patient Instructions (Addendum)
Abnormal bleeding is a common side effect of nexplanon. This usually improves within the first year. Please take the celebrex twice daily for 1 week to see if it helps. If not, please let us know and we will send in another medication.  Please come back to see me soon for a regular visit.  Take care,  Dr Jimmey Ralph

## 2015-12-08 ENCOUNTER — Encounter: Payer: Self-pay | Admitting: Family Medicine

## 2015-12-08 LAB — URINE CYTOLOGY ANCILLARY ONLY
CHLAMYDIA, DNA PROBE: NEGATIVE
Neisseria Gonorrhea: NEGATIVE

## 2015-12-09 ENCOUNTER — Telehealth: Payer: Self-pay | Admitting: Family Medicine

## 2015-12-09 NOTE — Telephone Encounter (Signed)
Results were negative. A letter has already been sent.  Katina Degree. Jimmey Ralph, MD [facility] Resident PGY-3 12/09/2015 12:18 PM

## 2015-12-09 NOTE — Telephone Encounter (Signed)
Pt would like her lab results, please advise. Thanks! ep

## 2015-12-09 NOTE — Telephone Encounter (Signed)
Pt was informed. ep °

## 2015-12-19 ENCOUNTER — Telehealth: Payer: Self-pay | Admitting: Family Medicine

## 2015-12-19 NOTE — Telephone Encounter (Signed)
Pt stated Dr. Jimmey RalphParker called her on Friday, pt was returning his phone call. Pt would like to be called on her cell. Please advise. Thanks! ep

## 2015-12-19 NOTE — Telephone Encounter (Signed)
Tried to call patient but no VM available. Jazmin Hartsell,CMA

## 2015-12-19 NOTE — Telephone Encounter (Signed)
I was on vacation last last week and did not call the patient. I do not see any other telephone encounters. I sent a letter two weeks ago with her lab work, which was normal.   [name], MD [facility] Resident PGY-3 12/19/2015 10:27 AM

## 2015-12-19 NOTE — Telephone Encounter (Signed)
Will forward to Dr. Parker.  Jazmin Hartsell,CMA  

## 2015-12-20 ENCOUNTER — Telehealth: Payer: Self-pay | Admitting: Family Medicine

## 2015-12-20 MED ORDER — NORGESTIMATE-ETH ESTRADIOL 0.25-35 MG-MCG PO TABS: 1.0000 | ORAL_TABLET | Freq: Every day | ORAL | 11 refills | Status: DC

## 2015-12-20 NOTE — Telephone Encounter (Signed)
Pt states she was given medication to stop bleeding two weeks ago, medication has not worked and pt is still bleeding. Please advise. Thanks! ep

## 2015-12-20 NOTE — Telephone Encounter (Signed)
Attempted to call pt to inform her of rx- no answer and no option for VM.

## 2015-12-20 NOTE — Telephone Encounter (Signed)
Patient's mother called yesterday about this and scheduled an appointment on 8/29 for the patient. She had previously been on birth control pills for this. I will send in a prescription for this. She should keep her appointment in 2 weeks.  [name], MD [facility] Resident PGY-3 12/20/2015 1:48 PM

## 2015-12-20 NOTE — Telephone Encounter (Signed)
Will forward to MD to advise. Jazmin Hartsell,CMA  

## 2015-12-23 ENCOUNTER — Ambulatory Visit (INDEPENDENT_AMBULATORY_CARE_PROVIDER_SITE_OTHER): Payer: Medicaid Other | Admitting: Family Medicine

## 2015-12-23 ENCOUNTER — Encounter: Payer: Self-pay | Admitting: Family Medicine

## 2015-12-23 DIAGNOSIS — N939 Abnormal uterine and vaginal bleeding, unspecified: Secondary | ICD-10-CM

## 2015-12-23 NOTE — Progress Notes (Signed)
    Subjective:  Erin Wilkins is a 20 y.o. female who [facility] with a chief complaint of irregular vaginal bleeding.   HPI:  Irregular Vaginal Bleeding Patient seen 2 weeks for irregular vaginal bleeding after placement of nexplanon around 7 months ago. Initially tried a course of NSAIDs, which has not worked. Amount of bleeding about the same as last time. No lightheadedness, shortness of breath, or chest pain. Has not tired OCPs yet.   ROS: Per HPI  Objective:  Physical Exam: BP 106/67   Pulse 85   Temp 99.8 F (37.7 C) (Oral)   Wt 93 lb (42.2 kg)   LMP 10/23/2015   SpO2 100%   BMI 17.57 kg/m   Gen: NAD, resting comfortably CV: RRR with no murmurs appreciated Pulm: NWOB, CTAB with no crackles, wheezes, or rhonchi GI: Normal bowel sounds present. Soft, Nontender, Nondistended. MSK: no edema, cyanosis, or clubbing noted Skin: warm, dry Neuro: grossly normal, moves all extremities Psych: Normal affect and thought content  Assessment/Plan:  Abnormal vaginal bleeding Not improved with NSAIDs. Will start OCPs today. If continues to have significant bleeding, consider further work up (urine pregnancy, TSH, imaging, etc). Told patient that bleeding typically resolves within the first year of placement.    [name], MD [facility] Resident PGY-3 12/23/2015 2:21 PM

## 2015-12-23 NOTE — Patient Instructions (Signed)
Stop the celebrex. Please start the birth control pills.  Give this medication a few weeks. If you are still having issues with bleeding, please let us know.  Take care,  Dr Jimmey RalphParker

## 2015-12-23 NOTE — Assessment & Plan Note (Signed)
Not improved with NSAIDs. Will start OCPs today. If continues to have significant bleeding, consider further work up (urine pregnancy, TSH, imaging, etc). Told patient that bleeding typically resolves within the first year of placement.

## 2016-01-03 ENCOUNTER — Ambulatory Visit: Payer: Medicaid Other | Admitting: Family Medicine

## 2016-01-06 ENCOUNTER — Ambulatory Visit: Payer: Medicaid Other | Admitting: Family Medicine

## 2016-01-10 ENCOUNTER — Ambulatory Visit (INDEPENDENT_AMBULATORY_CARE_PROVIDER_SITE_OTHER): Payer: Medicaid Other | Admitting: Family Medicine

## 2016-01-10 ENCOUNTER — Encounter: Payer: Self-pay | Admitting: Family Medicine

## 2016-01-10 DIAGNOSIS — Z23 Encounter for immunization: Secondary | ICD-10-CM | POA: Diagnosis present

## 2016-01-10 NOTE — Progress Notes (Signed)
Subjective:     Patient ID: Erin Wilkins, female   DOB: 18-Feb-1996, 20 y.o.   MRN: [id number]  HPI Nexplanon removal: patient here to discuss removal due to irregular period. However today she stated her period has normalized, she is only concern about her appetite and weight loss which she attributed to the Nexplanon. She stated she will prefer to keep Nexplanon in place if it is not the cause of her poor weight gain. Poor appetite: Patient stated that her appetite has been poor since she had her Nexplanon and she has not been able to gain weight.  Vaccination update: Will like Hep B and Flu vaccination for work.  Current Outpatient Prescriptions on File Prior to Visit  Medication Sig Dispense Refill  . ferrous sulfate 325 (65 FE) MG tablet Take 1 tablet (325 mg total) by mouth daily with breakfast. 30 tablet 2  . hydrocortisone cream-nystatin cream-zinc oxide Apply 1 application topically 2 (two) times daily. For 1-2 weeks. 1 Tube 0  . norgestimate-ethinyl estradiol (ORTHO-CYCLEN,SPRINTEC,PREVIFEM) 0.25-35 MG-MCG tablet Take 1 tablet by mouth daily. 1 Package 11  . nystatin cream (MYCOSTATIN) Apply 1 application topically 2 (two) times daily. 30 g 1  . polyethylene glycol powder (GLYCOLAX/MIRALAX) powder Take 17 g by mouth 2 (two) times daily as needed. 3350 g 1  . Prenatal Vit-Min-FA-Fish Oil ([employer] PRENATAL GUMMY) 0.4-113.5 MG CHEW Chew 2 tablets by mouth daily. (Patient not taking: Reported on 04/20/2015) 30 tablet 11  . [DISCONTINUED] clindamycin (CLINDAGEL) 1 % gel APPLY TO FACE DAILY AS DIRECTED 60 g 2   No current facility-administered medications on file prior to visit.    Past Medical History:  Diagnosis Date  . Anemia   . Pertussis 03/18/2012  . UTI (urinary tract infection)      Review of Systems  Constitutional: Negative.   Respiratory: Negative.   Cardiovascular: Negative.   Gastrointestinal: Negative.   Genitourinary: Negative.   All other systems reviewed and are  negative.      Vitals:   01/10/16 0858  BP: 112/71  Pulse: 84  Temp: 98.9 F (37.2 C)  TempSrc: Oral  Weight: 93 lb 9.6 oz (42.5 kg)    Objective:   Physical Exam  Constitutional: She appears well-developed. No distress.  Cardiovascular: Normal rate, regular rhythm and normal heart sounds.   No murmur heard. Pulmonary/Chest: Effort normal and breath sounds normal. No respiratory distress. She has no wheezes.  Abdominal: Soft. Bowel sounds are normal. She exhibits no distension and no mass. There is no tenderness.  Nursing note and vitals reviewed.      Assessment:     Nexplanon removal: Poor appetite: Vaccination update:     Plan:     1. I counseled patient that Nexplanon actually causes weight gain not weight loss and no known poor appetite side effect.     She stated she will keep her Nexplanon for now since her bleeding already stopped.     I advised she can always come back should she decide to take it out. She verbalized understanding.  2. I recommended starting MVI and drinking 1-2 glass of whole milk daily in addition to 3 square meal.     F/U with PCP soon for weight management.  3. Per the [place] immunization record, she is up to date with her Hep B vaccination.     Flu shot given today.     F/U as needed,.

## 2016-01-10 NOTE — Patient Instructions (Signed)
It was nice seeing you today. Please call soon if you desire to remove your Nexplanon in the future.

## 2016-02-27 ENCOUNTER — Ambulatory Visit: Payer: Medicaid Other | Admitting: Internal Medicine

## 2016-02-28 ENCOUNTER — Encounter: Payer: Self-pay | Admitting: Student

## 2016-02-28 ENCOUNTER — Ambulatory Visit (INDEPENDENT_AMBULATORY_CARE_PROVIDER_SITE_OTHER): Payer: Medicaid Other | Admitting: Student

## 2016-02-28 VITALS — BP 130/82 | HR 110 | Temp 98.4°F | Ht 61.0 in | Wt 95.6 lb

## 2016-02-28 DIAGNOSIS — R05 Cough: Secondary | ICD-10-CM

## 2016-02-28 DIAGNOSIS — J309 Allergic rhinitis, unspecified: Secondary | ICD-10-CM | POA: Diagnosis present

## 2016-02-28 DIAGNOSIS — R059 Cough, unspecified: Secondary | ICD-10-CM

## 2016-02-28 MED ORDER — FLUTICASONE PROPIONATE 50 MCG/ACT NA SUSP: 2.0000 | Freq: Every day | NASAL | 6 refills | Status: DC

## 2016-02-28 NOTE — Progress Notes (Signed)
   Subjective:    Patient ID: Erin Wilkins is a 20 y.o. old female.  HPI #Cough: this has been going on for one week. Mostly dry. Clear phlegm when productive. No other symptoms.  Reports occasional postnasal drip. Chest pain with cough. She works at [employer]a [employer] and wonder if the chemical they uses there has an effect on her cough. Concerned about her breathing. She says she has to cough when she takes deep breath. States that her son had common cold about two weeks ago.  Denies runny nose, congestion or sore throat. Denies fever, chills or chest pain with breathing or exertion.   PMH: Allergic rhinitis  SH: denies smoking. Mother smokes  Review of Systems Per HPI Objective:   Vitals:   02/28/16 0844  BP: 130/82  Pulse: (!) 110  Temp: 98.4 F (36.9 C)  TempSrc: Oral  Weight: 95 lb 9.6 oz (43.4 kg)  Height: 5\' 1"  (1.549 m)    GEN: appears well, no apparent distress. HEENT:   Head: normocephalic and atraumatic   Eyes: without conjunctival injection, sclera anicteric  Ears: normal TM and ear canal,   Nares: some rhinorrhea, positive for congestion or erythema  Oropharynx: mmm without erythema or exudation CVS: HR 90 bpm, RR, normal s1 and s2, no murmurs, no edema, cap refill < 2sec RESP: no increased work of breathing, good air movement bilaterally, no crackles or wheeze GI: Bowel sounds present and normal, soft, non-tender,non-distended SKIN: no apparent skin lesion HEM: Negative for cervical lymphadenopathy. NEURO: alert and oriented appropriately, no gross defecits  PSYCH: appropriate mood and affect     Assessment & Plan:  Cough Likely due to allergy versus viral URI. She has history of allergic rhinitis. History also remarkable for sick contact. No fever, SOB, other symptoms to suggest seriour pulmonary infection. Exam remarkable for some rhinorrhea, erythema and congestion of nasal turbinates. Generally appears well. Recommended supportive  care.  -Gave prescription for Flonase nasal spray -Conservative management (see AVS) -Discussed return precautions

## 2016-02-28 NOTE — Patient Instructions (Addendum)
It was great seeing you today! We have addressed the following issues today  1. Cough: This is likely due to allergy or viral infection. I sent a prescription for Flonase nasal spray to the pharmacy for our allergy. Please use this daily for the next 2 weeks and then as needed. I also recommend adequate hydration water/Gatorade. A tablespoonful of honey before bedtime is also helpful with cough. Cough might take up to 6 weeks to resolve completely. I also recommend that you discuss about smoking with your mother and your spouse.    If we did any lab work today, and the results require attention, either me or my nurse will get in touch with you. If everything is normal, you will get a letter in mail. If you don't hear from us in two weeks, please give us a call. Otherwise, we look forward to seeing you again at your next visit. If you have any questions or concerns before then, please call the clinic at [phone].   Please bring all your medications to every doctors visit   Sign up for My Chart to have easy access to your labs results, and communication with your Primary care physician.     Please check-out at the front desk before leaving the clinic.    Take Care,

## 2016-02-28 NOTE — Assessment & Plan Note (Signed)
Likely due to allergy versus viral URI. She has history of allergic rhinitis. History also remarkable for sick contact. No fever, SOB, other symptoms to suggest seriour pulmonary infection. Exam remarkable for some rhinorrhea, erythema and congestion of nasal turbinates. Generally appears well. Recommended supportive care.  -Gave prescription for Flonase nasal spray -Conservative management (see AVS) -Discussed return precautions

## 2016-03-01 ENCOUNTER — Ambulatory Visit (INDEPENDENT_AMBULATORY_CARE_PROVIDER_SITE_OTHER): Payer: Medicaid Other | Admitting: Internal Medicine

## 2016-03-01 ENCOUNTER — Telehealth: Payer: Self-pay | Admitting: Family Medicine

## 2016-03-01 ENCOUNTER — Other Ambulatory Visit (HOSPITAL_COMMUNITY)
Admission: RE | Admit: 2016-03-01 | Discharge: 2016-03-01 | Disposition: A | Discharge status: Home / Self Care | Payer: Medicaid Other | Source: Ambulatory Visit | Attending: Family Medicine | Admitting: Family Medicine

## 2016-03-01 VITALS — BP 114/69 | HR 78 | Temp 98.4°F | Wt 94.8 lb

## 2016-03-01 DIAGNOSIS — B9689 Other specified bacterial agents as the cause of diseases classified elsewhere: Secondary | ICD-10-CM

## 2016-03-01 DIAGNOSIS — N898 Other specified noninflammatory disorders of vagina: Secondary | ICD-10-CM

## 2016-03-01 DIAGNOSIS — N760 Acute vaginitis: Secondary | ICD-10-CM | POA: Diagnosis not present

## 2016-03-01 DIAGNOSIS — Z113 Encounter for screening for infections with a predominantly sexual mode of transmission: Secondary | ICD-10-CM | POA: Insufficient documentation

## 2016-03-01 LAB — POCT WET PREP (WET MOUNT)
CLUE CELLS WET PREP WHIFF POC: POSITIVE
Trichomonas Wet Prep HPF POC: ABSENT

## 2016-03-01 MED ORDER — METRONIDAZOLE 500 MG PO TABS: 500.0000 mg | ORAL_TABLET | Freq: Two times a day (BID) | ORAL | 0 refills | Status: DC

## 2016-03-01 NOTE — Progress Notes (Signed)
   [facility] Phone: [phone]   Date of Visit: 03/01/2016   HPI:  Erin Wilkins is a 20 y.o. female presenting to clinic today for same day appointment. PCP: Erin Doealeb Parker, MD Concerns today include: - vaginal discharge started yesterday: thick yesterday and watery today with some odor. White in color  - no vaginal itching or irritation  - no vaginal bleeding - no dysuria or increased frequency of urination - no abdominal pain, fevers, chills - sexually active; uses condoms intermittently, does have Nexplanon for birthcontrol  - has one partner; no new partners  ROS: See HPI.  PMFSH: Hx of Trichomonas   PHYSICAL EXAM: BP 114/69 (BP Location: Left Arm, Patient Position: Sitting, Cuff Size: Normal)   Pulse 78   Temp 98.4 F (36.9 C) (Oral)   Wt 94 lb 12.8 oz (43 kg)   LMP 01/18/2016 (Approximate)   BMI 17.91 kg/m  GEN: NAD  CV: RRR, no murmurs, rubs, or gallops PULM: CTAB, normal effort ABD: Soft, nontender, nondistended, NABS, no organomegaly SKIN: No rash or cyanosis; warm and well-perfused GU: Female genitalia: normal external genitalia, vulva, vagina, cervix. Clear mucus like discharge which seems normal appearing.    ASSESSMENT/PLAN:  Bacterial Vaginosis:  Wet prep with clue cells and positive whiff test. Called patient to report results. - Prescribed Flagyl 500mg  BID x 7 days. Discussed avoidance of alcohol during and a few days after treatment. - Gc/Chlamydia testing sent as well     Palma HolterKanishka G Dajanee Voorheis, MD PGY 2 [facility]

## 2016-03-01 NOTE — Patient Instructions (Signed)
I will call you with your results. Thank you for coming in

## 2016-03-01 NOTE — Telephone Encounter (Signed)
Pt would like someone to call her regarding lab results. Please advise. Thanks! ep °

## 2016-03-01 NOTE — Telephone Encounter (Signed)
Will forward to MD. Jazmin Hartsell,CMA  

## 2016-03-02 LAB — CERVICOVAGINAL ANCILLARY ONLY
Chlamydia: NEGATIVE
NEISSERIA GONORRHEA: NEGATIVE
Trichomonas: NEGATIVE

## 2016-03-02 NOTE — Telephone Encounter (Signed)
Called patient to report negative GC/Chlamydia/Trich. We already discussed diagnosis of BV yesterday.

## 2016-03-02 NOTE — Telephone Encounter (Signed)
Will forward to MD who saw her yesterday.  [name], MD [facility] Resident PGY-3 03/02/2016 1:35 PM

## 2016-03-19 ENCOUNTER — Telehealth: Payer: Self-pay | Admitting: Family Medicine

## 2016-03-19 NOTE — Telephone Encounter (Signed)
Pt just got off a round of antibiotics, which gave her a yeast infection. [facility] [address] Please advise. Thanks! ep

## 2016-03-20 MED ORDER — FLUCONAZOLE 150 MG PO TABS: 150.0000 mg | ORAL_TABLET | Freq: Once | ORAL | 0 refills | Status: AC

## 2016-03-20 NOTE — Telephone Encounter (Signed)
Rx for diflucan sent in. If patient still has symptoms after being treated, she will need to come in for evaluation.  [name], MD [facility] Resident PGY-3 03/20/2016 12:06 PM

## 2016-04-17 ENCOUNTER — Encounter: Payer: Medicaid Other | Admitting: Family Medicine

## 2016-04-24 ENCOUNTER — Encounter: Payer: Self-pay | Admitting: Student

## 2016-04-24 ENCOUNTER — Other Ambulatory Visit (HOSPITAL_COMMUNITY)
Admission: RE | Admit: 2016-04-24 | Discharge: 2016-04-24 | Disposition: A | Discharge status: Home / Self Care | Payer: Medicaid Other | Source: Ambulatory Visit | Attending: Family Medicine | Admitting: Family Medicine

## 2016-04-24 ENCOUNTER — Ambulatory Visit (INDEPENDENT_AMBULATORY_CARE_PROVIDER_SITE_OTHER): Payer: Medicaid Other | Admitting: Student

## 2016-04-24 ENCOUNTER — Ambulatory Visit: Payer: Medicaid Other | Admitting: Family Medicine

## 2016-04-24 VITALS — BP 110/80 | HR 75 | Temp 98.9°F | Wt 96.0 lb

## 2016-04-24 DIAGNOSIS — N898 Other specified noninflammatory disorders of vagina: Secondary | ICD-10-CM | POA: Diagnosis present

## 2016-04-24 DIAGNOSIS — Z113 Encounter for screening for infections with a predominantly sexual mode of transmission: Secondary | ICD-10-CM | POA: Diagnosis present

## 2016-04-24 LAB — POCT WET PREP (WET MOUNT)
Clue Cells Wet Prep Whiff POC: NEGATIVE
Trichomonas Wet Prep HPF POC: ABSENT

## 2016-04-24 NOTE — Progress Notes (Signed)
Subjective:    Patient ID: Erin Wilkins, female    DOB: 1996/02/01, 20 y.o.   MRN: [id number]   CC: Vaginal discharge  HPI:  20 y/o F presents for vaginal discharge  Vaginal discharge - white in color - noted it after being treated for BV and is concerned she has a yeast infection - denies itching, abdominal pain, dysuria, fever - sexually active with one partner, with occasional condom use - Has Nexplanon in place  Smoking status reviewed  Review of Systems  Per HPI  Objective:  BP 110/80   Pulse 75   Temp 98.9 F (37.2 C) (Oral)   Wt 96 lb (43.5 kg)   LMP 03/21/2016   SpO2 98%   BMI 18.14 kg/m  Vitals and nursing note reviewed  General: NAD Cardiac: RRR Respiratory: CTAB, normal effort Abdomen: soft, nontender Extremities: no edema or cyanosis. Skin: warm and dry, no rashes noted Neuro: alert and oriented, no focal deficits  Pelvic: Normal EGBUS, normal vaginal canal, normal cervix with no CMT, normal mobile uterus, normal adnexa with no masses, no adnexal tenderness     Assessment & Plan:    Vaginal discharge No red flag symptoms on exam or history. Negative wet prep. GC/CT collected - will follow results and treated as needed    Dalyn Kjos A. Kennon Rounds MD, MS Family Medicine Resident PGY-3 Pager [phone]

## 2016-04-24 NOTE — Assessment & Plan Note (Addendum)
No red flag symptoms on exam or history. Negative wet prep. GC/CT collected - will follow results and treated as needed

## 2016-04-24 NOTE — Patient Instructions (Signed)
Follow up with your PCP as needed If you have worsening discharge or abdominal pain, return for evaluation

## 2016-04-25 LAB — CERVICOVAGINAL ANCILLARY ONLY
Chlamydia: NEGATIVE
Neisseria Gonorrhea: NEGATIVE

## 2016-04-26 ENCOUNTER — Encounter: Payer: Medicaid Other | Admitting: Family Medicine

## 2016-04-26 ENCOUNTER — Telehealth: Payer: Self-pay | Admitting: Family Medicine

## 2016-04-26 NOTE — Telephone Encounter (Signed)
Pt is calling for her test results. jw °

## 2016-04-26 NOTE — Telephone Encounter (Signed)
Will forward to MD to advise on results. Laya Letendre,CMA  

## 2016-04-26 NOTE — Telephone Encounter (Signed)
Patient called to inform her that her gonorrhea and chlamydia testing is negatuve

## 2016-06-15 ENCOUNTER — Telehealth: Payer: Self-pay | Admitting: Family Medicine

## 2016-06-15 NOTE — Telephone Encounter (Signed)
Will forward to MD to advise. Jazmin Hartsell,CMA  

## 2016-06-15 NOTE — Telephone Encounter (Signed)
Pt wants to know if it is ok to take apetamin syrup. Please advise. ep

## 2016-06-15 NOTE — Telephone Encounter (Signed)
I would not recommend use of apetamin. If patient wants to discuss ways to gain weight, she can come in for an office visit or we can refer her to our nutritionist.  [name], MD [facility] Resident PGY-3 06/15/2016 11:42 AM

## 2016-06-15 NOTE — Telephone Encounter (Signed)
Pt informed of recommendation. Pt did not want to make an apt at this time. [facility]

## 2016-06-26 ENCOUNTER — Ambulatory Visit: Payer: Medicaid Other | Admitting: Internal Medicine

## 2016-06-29 ENCOUNTER — Ambulatory Visit (INDEPENDENT_AMBULATORY_CARE_PROVIDER_SITE_OTHER): Payer: Medicaid Other | Admitting: Student

## 2016-06-29 ENCOUNTER — Encounter: Payer: Self-pay | Admitting: Student

## 2016-06-29 VITALS — BP 92/60 | HR 78 | Temp 98.2°F | Ht 61.0 in | Wt 97.6 lb

## 2016-06-29 DIAGNOSIS — N3001 Acute cystitis with hematuria: Secondary | ICD-10-CM | POA: Insufficient documentation

## 2016-06-29 DIAGNOSIS — N3000 Acute cystitis without hematuria: Secondary | ICD-10-CM | POA: Diagnosis not present

## 2016-06-29 DIAGNOSIS — K5900 Constipation, unspecified: Secondary | ICD-10-CM

## 2016-06-29 DIAGNOSIS — R300 Dysuria: Secondary | ICD-10-CM | POA: Diagnosis not present

## 2016-06-29 DIAGNOSIS — N390 Urinary tract infection, site not specified: Secondary | ICD-10-CM | POA: Insufficient documentation

## 2016-06-29 LAB — POCT UA - MICROSCOPIC ONLY

## 2016-06-29 LAB — POCT URINALYSIS DIPSTICK
Bilirubin, UA: NEGATIVE
GLUCOSE UA: NEGATIVE
KETONES UA: NEGATIVE
Nitrite, UA: NEGATIVE
Protein, UA: NEGATIVE
SPEC GRAV UA: 1.025
UROBILINOGEN UA: 0.2
pH, UA: 6.5

## 2016-06-29 MED ORDER — DOCUSATE SODIUM 100 MG PO CAPS: 100.0000 mg | ORAL_CAPSULE | Freq: Two times a day (BID) | ORAL | 0 refills | Status: DC

## 2016-06-29 MED ORDER — NITROFURANTOIN MONOHYD MACRO 100 MG PO CAPS: 100.0000 mg | ORAL_CAPSULE | Freq: Two times a day (BID) | ORAL | 0 refills | Status: DC

## 2016-06-29 NOTE — Progress Notes (Signed)
  Subjective:    Erin Wilkins is a 21 y.o. old female here for sense of incomplete void after urination  HPI Sense of incomplete void: this happens after urination. Started about 4 days ago. Reports strong odor to her urine. Denies dysuria but admits pain when she is done urinating. She has sensation of incomplete void. Reports increaed frequency as well. Denies urge, fever, chill, flank or back pain. Has been using cranberry juice and water which helped a little bit. She also reports intermittent abdominal pain when she get gassy. Last BM about three days ago.  Denies vaginal discharge. LMP 06/24/2016. Denies urinary symptoms since she had a child about a year ago.  PMH/Problem List: has Dysuria; Allergic rhinitis; Cough; Constipation; Pruritus ani; Abnormal vaginal bleeding; Vaginal discharge; and Urinary tract infection on her problem list.   has a past medical history of Anemia; Pertussis (03/18/2012); and UTI (urinary tract infection).  FH:  No family history on file.  SH Social History  Substance Use Topics  . Smoking status: Never Smoker  . Smokeless tobacco: Never Used  . Alcohol use No    Review of Systems Review of systems negative except for pertinent positives and negatives in history of present illness above.     Objective:     Vitals:   06/29/16 1104  BP: 92/60  Pulse: 78  Temp: 98.2 F (36.8 C)  TempSrc: Oral  Weight: 97 lb 9.6 oz (44.3 kg)  Height: 5\' 1"  (1.549 m)    Physical Exam GEN: appears well, no apparent distress. HEM: negative for cervical or periauricular lymphadenopathies CVS: RRR, nl S1&S2, no murmurs, no edema RESP: speaks in full sentence, no IWOB, good air movement bilaterally, CTAB GI: Bowel sounds present & normal, soft, mild tenderness to palpation over right mid abdomen,  no guarding, no rebound, no mass GU: no suprapubic or CVA tenderness SKIN: no apparent skin lesion NEURO: alert and oiented appropriately, no gross defecits  PSYCH:  euthymic mood with congruent affect    Assessment and Plan:  Urinary tract infection Patient was increased frequency of urination and sense of incomplete void. Urinalysis with trace LE, trace bacteria and 6-10 WBC. Patient is allergic to Keflex. Will treat for UTI with Macrobid 100 mg twice a day for 5 days. Will send urine for urine culture as well.  Orders Placed This Encounter  Procedures  . Urine culture  . Urinalysis Dipstick  . POCT UA - Microscopic Only    No Follow-up on file.  Almon Herculesaye T Raheel Kunkle, MD 06/29/16 Pager: [phone]

## 2016-06-29 NOTE — Patient Instructions (Signed)
It was great seeing you today! We have addressed the following issues today 1. Your urine test shows some signs of inflammation. With the symptoms you have, it is concerning for UTI. I have sent a prescription for antibody to your pharmacy. Take this medication until you complete the whole course. Someone will get in touch with you if the urine culture grow bacteria.   If we did any lab work today, and the results require attention, either me or my nurse will get in touch with you. If everything is normal, you will get a letter in mail and a message via . If you don't hear from us in two weeks, please give us a call. Otherwise, we look forward to seeing you again at your next visit. If you have any questions or concerns before then, please call the clinic at [phone].  Please bring all your medications to every doctors visit  Sign up for My Chart to have easy access to your labs results, and communication with your Primary care physician.    Please check-out at the front desk before leaving the clinic.    Take Care,   Dr. Alanda SlimGonfa

## 2016-06-29 NOTE — Assessment & Plan Note (Signed)
Reports going two to three days without BM. LBM three days ago. Wondering if this is contributing to her urinary symptoms as well -Recommended dietary changes -A prescription for Colace. Advised her to try Senakot if no improvement with colace.

## 2016-06-29 NOTE — Assessment & Plan Note (Signed)
Patient was increased frequency of urination and sense of incomplete void. Urinalysis with trace LE, trace bacteria and 6-10 WBC. Patient is allergic to Keflex. Will treat for UTI with Macrobid 100 mg twice a day for 5 days. Will send urine for urine culture as well.

## 2016-07-02 LAB — URINE CULTURE

## 2016-08-14 ENCOUNTER — Ambulatory Visit (INDEPENDENT_AMBULATORY_CARE_PROVIDER_SITE_OTHER): Payer: Medicaid Other | Admitting: Family Medicine

## 2016-08-14 ENCOUNTER — Encounter: Payer: Self-pay | Admitting: Family Medicine

## 2016-08-14 VITALS — BP 100/72 | HR 84 | Temp 98.6°F | Wt 102.0 lb

## 2016-08-14 DIAGNOSIS — J029 Acute pharyngitis, unspecified: Secondary | ICD-10-CM | POA: Diagnosis not present

## 2016-08-14 DIAGNOSIS — J309 Allergic rhinitis, unspecified: Secondary | ICD-10-CM | POA: Diagnosis not present

## 2016-08-14 LAB — POCT RAPID STREP A (OFFICE): Rapid Strep A Screen: NEGATIVE

## 2016-08-14 NOTE — Assessment & Plan Note (Signed)
Symptoms most likely related to allergic rhinitis. Rapid strep negative. Doubt viral URI given lack of other symptoms. Recommended restarting flonase. Continue tylenol and ibuprofen as needed. Return precautions reviewed. Follow up as needed.

## 2016-08-14 NOTE — Progress Notes (Signed)
    Subjective:  Erin Wilkins is a 21 y.o. female who presents to the [facility] today with a chief complaint of sore throat.   HPI:  Sore Throat Symptoms started 4 days ago. No obvious associated symptoms. No runny nose, no fevers, no cough, no swollen lymph nodes, no sick contacts. She has tried ibuprofen which helped some.   ROS: Per HPI  PMH: Smoking history reviewed.   Objective:  Physical Exam: BP 100/72   Pulse 84   Temp 98.6 F (37 C) (Oral)   Wt 102 lb (46.3 kg)   LMP  (LMP Unknown)   SpO2 99%   BMI 19.27 kg/m   Gen: NAD, resting comfortably HEENT: TMs clear bilaterally. OP clear. No LAD.  CV: RRR with no murmurs appreciated Pulm: NWOB, CTAB with no crackles, wheezes, or rhonchi MSK: no edema, cyanosis, or clubbing noted Skin: warm, dry Neuro: grossly normal, moves all extremities Psych: Normal affect and thought content  Results for orders placed or performed in visit on 08/14/16 (from the past 72 hour(s))  POCT rapid strep A     Status: None   Collection Time: 08/14/16 11:23 AM  Result Value Ref Range   Rapid Strep A Screen Negative Negative   Assessment/Plan:  Allergic rhinitis Symptoms most likely related to allergic rhinitis. Rapid strep negative. Doubt viral URI given lack of other symptoms. Recommended restarting flonase. Continue tylenol and ibuprofen as needed. Return precautions reviewed. Follow up as needed.    Katina Degree. Jimmey Ralph, MD [facility] Family Medicine Resident PGY-3 08/14/2016 11:49 AM

## 2016-08-14 NOTE — Patient Instructions (Addendum)
Your strep test was negative.  Please restart the flonase.   Come back if not improving in 7-10 days.  Take care,  Dr Jimmey Ralph

## 2016-09-03 ENCOUNTER — Encounter: Payer: Self-pay | Admitting: Student

## 2016-09-03 ENCOUNTER — Ambulatory Visit (INDEPENDENT_AMBULATORY_CARE_PROVIDER_SITE_OTHER): Payer: Medicaid Other | Admitting: Student

## 2016-09-03 DIAGNOSIS — N939 Abnormal uterine and vaginal bleeding, unspecified: Secondary | ICD-10-CM

## 2016-09-03 MED ORDER — NORETHINDRONE ACET-ETHINYL EST 1-20 MG-MCG PO TABS: 1.0000 | ORAL_TABLET | Freq: Every day | ORAL | 11 refills | Status: DC

## 2016-09-03 NOTE — Assessment & Plan Note (Signed)
Continue vaginal bleeding with Nexplanon. She is hemodynamically stable with only mild bleeding on exam - lower dose OCPs prescribed - as this is not her first complaint of bleeding with the nexplanon we discussed alternate contraception options like the Mirena IUD which she was amenable to. She will make an appt for IUD insertion - follow up precautions and ED precautions discussed

## 2016-09-03 NOTE — Progress Notes (Signed)
Subjective:    Patient ID: Erin Wilkins, female    DOB: Jul 26, 1995, 21 y.o.   MRN: [id number]   CC: vaginal bleeding  HPI: 21 y/o F presents for vaginal bleeding  Vaginal bleeding - start two weeks ago - has been of waxing and waning heaviness - she took two birth control pills last week but this made her nauseous and she threw them up - she has not been otherwise taking OCPs - became more heavy yesterday with clots - no lighteadedness or dizziness - this AM she noted very heavy bleeding when she woke up this AM so she came in - no vaginal odor or irritation  Smoking status reviewed  Review of Systems  Per HPI, else denies  chest pain, shortness of breath,   Objective:  BP 102/76   Pulse 89   Temp 98.5 F (36.9 C) (Oral)   Wt 101 lb (45.8 kg)   LMP 08/20/2016   SpO2 97%   BMI 19.08 kg/m  Vitals and nursing note reviewed  General: NAD Cardiac: RRR, Respiratory: CTAB, normal effort Extremities: nexplanon in place in left upper arm Skin: warm and dry, no rashes noted Neuro: alert and oriented, no focal deficits  Pelvic: Normal EGBUS, normal vaginal canal with mild amount of blood in the canal approx 3-5 ccs, normal cervix with no CMT, normal mobile uterus, normal adnexa with no masses, no adnexal tenderness     Assessment & Plan:    Abnormal vaginal bleeding Continue vaginal bleeding with Nexplanon. She is hemodynamically stable with only mild bleeding on exam - lower dose OCPs prescribed - as this is not her first complaint of bleeding with the nexplanon we discussed alternate contraception options like the Mirena IUD which she was amenable to. She will make an appt for IUD insertion - follow up precautions and ED precautions discussed    Arless Vineyard A. Kennon Rounds MD, MS Family Medicine Resident PGY-3 Pager [phone]

## 2016-09-03 NOTE — Patient Instructions (Signed)
Follow up as needed with PCP Take birth control daily for bleeding If you have worsening bleeding such that you soak more than 1 pad per hour or develop lightheadedness go to the emergency room If you have questions or concerns, call the office at [phone]

## 2016-10-02 ENCOUNTER — Ambulatory Visit (INDEPENDENT_AMBULATORY_CARE_PROVIDER_SITE_OTHER): Payer: Medicaid Other | Admitting: Family Medicine

## 2016-10-02 ENCOUNTER — Other Ambulatory Visit (HOSPITAL_COMMUNITY)
Admission: RE | Admit: 2016-10-02 | Discharge: 2016-10-02 | Disposition: A | Discharge status: Home / Self Care | Payer: Medicaid Other | Source: Ambulatory Visit | Attending: Family Medicine | Admitting: Family Medicine

## 2016-10-02 ENCOUNTER — Encounter: Payer: Self-pay | Admitting: Family Medicine

## 2016-10-02 VITALS — BP 109/78 | HR 81 | Temp 98.5°F | Ht 61.0 in | Wt 103.0 lb

## 2016-10-02 DIAGNOSIS — Z202 Contact with and (suspected) exposure to infections with a predominantly sexual mode of transmission: Secondary | ICD-10-CM

## 2016-10-02 DIAGNOSIS — N898 Other specified noninflammatory disorders of vagina: Secondary | ICD-10-CM

## 2016-10-02 DIAGNOSIS — Z20828 Contact with and (suspected) exposure to other viral communicable diseases: Secondary | ICD-10-CM

## 2016-10-02 LAB — POCT WET PREP (WET MOUNT)
CLUE CELLS WET PREP WHIFF POC: NEGATIVE
Trichomonas Wet Prep HPF POC: ABSENT

## 2016-10-02 NOTE — Progress Notes (Signed)
   Subjective:   Lorelee MarketJada Rezek is a 21 y.o. female with a history of Allergic rhinitis, abnormal vaginal bleeding here for same day appointment for  Chief Complaint  Patient presents with  . std check     VAGINAL DISCHARGE  Having vaginal discharge for 7 days. Medications tried: none Discharge consistency: thick and cottage cheese like Discharge color: yellow Recent antibiotic use: no Sex in last month: yes, 1 female partner Possible STD exposure: only uses condoms sometimes, monogamous  Symptoms Fever: no Dysuria:no Vaginal bleeding: always with irreg periods Abdomen or Pelvic pain: no Back pain: no Genital sores or ulcers:no Rash: no Pain during sex: no Missed menstrual period: no + pruritis  Thinks that she has a yeast infection  ROS see HPI Smoking Status noted   Objective:  BP 109/78   Pulse 81   Temp 98.5 F (36.9 C) (Oral)   Ht 5\' 1"  (1.549 m)   Wt 103 lb (46.7 kg)   SpO2 99%   BMI 19.46 kg/m   Gen:  21 y.o. female in NAD HEENT: NCAT, MMM, anicteric sclerae CV: Reg rate Resp: Non-labored, CTAB, no wheezes noted Abd: Soft, NTND, BS present, no guarding or organomegaly GYN:  External genitalia within normal limits.  Vaginal mucosa pink, moist, normal rugae.  Nonfriable cervix without lesions, +thin discharge and dark blood noted on speculum exam.  Bimanual exam revealed normal, nongravid uterus.  No cervical motion tenderness. No adnexal masses bilaterally.   Ext: WWP, no edema MSK: No obvious deformities, gait intact Neuro: Alert and oriented, speech normal      Assessment & Plan:     Lorelee MarketJada Entwistle is a 21 y.o. female here for   Vaginal discharge No evidence of PID Wet prep, GC/CT, HIV, RPR Will f/u and treat as indicated   Bacigalupo, Marzella SchleinAngela M, MD MPH PGY-3,  [facility] 10/02/2016  3:31 PM

## 2016-10-02 NOTE — Patient Instructions (Signed)

## 2016-10-02 NOTE — Assessment & Plan Note (Signed)
No evidence of PID Wet prep, GC/CT, HIV, RPR Will f/u and treat as indicated

## 2016-10-03 LAB — RPR: RPR: NONREACTIVE

## 2016-10-03 LAB — CERVICOVAGINAL ANCILLARY ONLY
CHLAMYDIA, DNA PROBE: NEGATIVE
NEISSERIA GONORRHEA: NEGATIVE

## 2016-10-03 LAB — HIV ANTIBODY (ROUTINE TESTING W REFLEX): HIV Screen 4th Generation wRfx: NONREACTIVE

## 2016-10-09 ENCOUNTER — Telehealth: Payer: Self-pay | Admitting: *Deleted

## 2016-10-09 ENCOUNTER — Encounter: Payer: Self-pay | Admitting: Internal Medicine

## 2016-10-09 ENCOUNTER — Ambulatory Visit (INDEPENDENT_AMBULATORY_CARE_PROVIDER_SITE_OTHER): Payer: Medicaid Other | Admitting: Internal Medicine

## 2016-10-09 VITALS — BP 110/78 | HR 78 | Temp 98.4°F | Ht 61.0 in | Wt 103.2 lb

## 2016-10-09 DIAGNOSIS — R1031 Right lower quadrant pain: Secondary | ICD-10-CM | POA: Diagnosis not present

## 2016-10-09 LAB — CBC WITH DIFFERENTIAL
BASOS: 0 %
Basophils Absolute: 0 10*3/uL (ref 0.0–0.2)
EOS (ABSOLUTE): 0 10*3/uL (ref 0.0–0.4)
EOS: 0 %
HEMATOCRIT: 38.9 % (ref 34.0–46.6)
HEMOGLOBIN: 13.5 g/dL (ref 11.1–15.9)
Lymphocytes Absolute: 1.6 10*3/uL (ref 0.7–3.1)
Lymphs: 22 %
MCH: 30.2 pg (ref 26.6–33.0)
MCHC: 34.7 g/dL (ref 31.5–35.7)
MCV: 87 fL (ref 79–97)
MONOCYTES: 6 %
MONOS ABS: 0.4 10*3/uL (ref 0.1–0.9)
Neutrophils Absolute: 5 10*3/uL (ref 1.4–7.0)
Neutrophils: 72 %
RBC: 4.47 x10E6/uL (ref 3.77–5.28)
RDW: 13.9 % (ref 12.3–15.4)
WBC: 7.1 10*3/uL (ref 3.4–10.8)

## 2016-10-09 LAB — POCT WET PREP (WET MOUNT)
CLUE CELLS WET PREP WHIFF POC: NEGATIVE
TRICHOMONAS WET PREP HPF POC: ABSENT

## 2016-10-09 LAB — POCT URINE PREGNANCY: PREG TEST UR: NEGATIVE

## 2016-10-09 NOTE — Telephone Encounter (Signed)
-----   Message from Arvilla Marketatherine Lauren Wallace, DO sent at 10/09/2016  3:51 PM EDT ----- Please call patient to let her know that blood counts were normal and did not show any signs concerning for inflammation/infection. We do not need to obtain imaging of her abdomen at this time. I again recommend that she follow up within the next week for re-assessment.   Marcy Sirenatherine Wallace, D.O. 10/09/2016, 3:51 PM PGY-2, [facility]

## 2016-10-09 NOTE — Patient Instructions (Signed)
I will call you with your blood count results. If you have a very elevated white count, we would need to order imaging of your abdomen. Please make a follow up appointment with your PCP for within the next week to check to see how you are doing. Please seek medical care sooner if you have fevers, uncontrolled vomiting, severe worsening of your abdominal pain, or any other new concerning symptoms.

## 2016-10-09 NOTE — Progress Notes (Signed)
Subjective:    Erin Wilkins - 21 y.o. female MRN [id number]  Date of birth: [birth year]  HPI  Erin Wilkins is here for SDA for abdominal pain.   ABDOMINAL PAIN  Pain began 1 days ago. Started last night while laying on her stomach. Pain is located on the right side and feels like it is tender to the touch.  Medications tried: none Similar pain before:none Prior abdominal surgeries: none   Symptoms Nausea/vomiting: +Nausea without vomiting  Diarrhea: none Constipation: last BM yesterday, has had problems with constipation  Blood in stool: no Blood in vomit: N/A Fever: no Dysuria: no Loss of appetite: hasnt eaten since last night  Weight loss: No  No sick contacts.  Vaginal Bleeding: Intermittent. Uses Nexplanon. Spotting until yesterday.  Vaginal discharge present. Seen at OV last week with negative testing. Vaginal discharge has not changed in nature. Sexually active.   Review of Symptoms - see HPI PMH - Smoking status noted.     -  reports that she has never smoked. She has never used smokeless tobacco. - Review of Systems: Per HPI. - Past Medical History: Patient Active Problem List   Diagnosis Date Noted  . Vaginal discharge 04/24/2016  . Abnormal vaginal bleeding 12/07/2015  . Pruritus ani 11/17/2015  . Constipation 05/06/2015  . Cough 07/24/2013  . Allergic rhinitis 01/09/2013   - Medications: reviewed and updated   Objective:   Physical Exam BP 110/78   Temp 98.4 F (36.9 C) (Oral)   Ht 5\' 1"  (1.549 m)   Wt 103 lb 3.2 oz (46.8 kg)   BMI 19.50 kg/m  Gen: NAD, alert, cooperative with exam, well-appearing, non-toxic  HEENT: NCAT, clear conjunctiva, oropharynx clear, MMM  CV: RRR, good S1/S2, no murmur, no edema, capillary refill brisk  Resp: CTABL, no wheezes, non-labored Abd: BS present, soft, non-distended, patient endorses TTP in RLQ over McBurney's point but does not grimace to deep palpation, no rebound or guarding GU/GYN: Exam performed in the  presence of a chaperone. External genitalia within normal limits.  Vaginal mucosa pink, moist, normal rugae.  Cervix with mild amount of thick, somewhat mucopurulent discharge noted. Small amount of blood noted on top of cervix without lesions.  Bimanual exam revealed normal, nongravid uterus.  No cervical motion tenderness. No adnexal masses bilaterally.    Assessment & Plan:   1. Right lower quadrant abdominal pain Given TTP over McBurney's point, there is concern for appendicitis. However, patient is afebrile and very well appearing. Alvardo score of 4 indicating appendicitis unlikely. However, do not have WBC result. Will obtain CBC. If WBC normal, would be very reassured that appendicitis less likely. If WBC >10, Alvardo score increases to 6 indicating possible appendicitis. Would obtain CT abdomen if that is the case. Patient is not pregnant.  Vaginal discharge appears somewhat concerning for cervicitis but patient has no CMT on exam and recent negative cultures so less likely that PID is etiology of her symptoms. These changes noted may be related to recent menses. Ovarian pathology is possible although would expect pain to be significantly more substantial if urgent process such as ovarian torsion were to be occurring. Would consider constipation as etiology of pain if other causes ruled out and tests normal.  - POCT urine pregnancy negative  -Wet Prep with moderate bacteria  - CBC with differential; consider CT abdomen pending results  -GC/Chlamydia obtained  - return precautions including fevers, severe abdominal pain, uncontrolled vomiting etc discussed  - if pain remains could  consider pelvic ultrasound looking for ovarian pathology   Marcy Siren, D.O. 10/09/2016, 11:07 AM PGY-2, [facility]

## 2016-10-09 NOTE — Telephone Encounter (Signed)
Pt informed of below. Zimmerman Rumple, April D, CMA  

## 2016-10-11 LAB — CERVICOVAGINAL ANCILLARY ONLY
Chlamydia: NEGATIVE
NEISSERIA GONORRHEA: NEGATIVE

## 2016-10-12 ENCOUNTER — Telehealth: Payer: Self-pay | Admitting: *Deleted

## 2016-10-12 NOTE — Telephone Encounter (Signed)
Pt informed. Zimmerman Rumple, April D, CMA  

## 2016-10-12 NOTE — Telephone Encounter (Signed)
-----   Message from Campbell StallKaty Dodd Mayo, MD sent at 10/12/2016 12:38 PM EDT ----- Please let Ms. Donavan FoilBass know that her gonorrhea and chlamydia testing were negative.

## 2016-11-20 ENCOUNTER — Encounter: Payer: Self-pay | Admitting: Family Medicine

## 2016-11-20 ENCOUNTER — Ambulatory Visit (INDEPENDENT_AMBULATORY_CARE_PROVIDER_SITE_OTHER): Payer: Medicaid Other | Admitting: Family Medicine

## 2016-11-20 VITALS — BP 118/80 | HR 72 | Temp 98.7°F | Ht 61.0 in | Wt 101.8 lb

## 2016-11-20 DIAGNOSIS — B86 Scabies: Secondary | ICD-10-CM | POA: Diagnosis not present

## 2016-11-20 MED ORDER — PERMETHRIN 5 % EX CREA: TOPICAL_CREAM | Freq: Once | CUTANEOUS | Status: DC

## 2016-11-20 NOTE — Progress Notes (Signed)
Subjective:     Erin Wilkins is a 21 y.o. female who presents for evaluation of a rash involving the forearm. Rash started 1 week ago. Lesions are pt's skin color, and raised in texture. Rash has not changed over time. Rash is pruritic. Associated symptoms: none. Patient denies: arthralgia, cough, decrease in appetite, fever and headache. Patient has had contacts of son, sister, and mother with similar rash. Patient shares bed with all other family members. Patient has not had new exposures (soaps, lotions, laundry detergents, foods, medications, plants, insects or animals).  Review of Systems Pertinent items are noted in HPI.    Objective:    BP 118/80   Pulse 72   Temp 98.7 F (37.1 C) (Oral)   Ht 5\' 1"  (1.549 m)   Wt 101 lb 12.8 oz (46.2 kg)   SpO2 99%   BMI 19.23 kg/m  General:  alert and cooperative  Skin:  papules noted on extremities     Assessment/Plan:   Scabies Given contact history of 3 other family members with similar lesions, recommend concurrent treatment of everyone in the household with permethrin cream. Instructions given on how to apply permethrin and how to clean house to prevent further outbreak. Have patient return in two weeks to reassess.   Thomes DinningBrad Braydee Shimkus, MD, MS FAMILY MEDICINE RESIDENT - PGY1 11/20/2016 4:20 PM

## 2016-11-20 NOTE — Assessment & Plan Note (Signed)
Given contact history of 3 other family members with similar lesions, recommend concurrent treatment of everyone in the household with permethrin cream. Instructions given on how to apply permethrin and how to clean house to prevent further outbreak. Have patient return in two weeks to reassess.

## 2016-11-20 NOTE — Patient Instructions (Addendum)
It was a pleasure to see you today! Thank you for choosing [facility] for your primary care. Erin Wilkins was seen for scabies. Please apply permethrin to everyone in the house hold using the instructions below. Please return in two weeks to re-evaluate scabies.   Best,  Thomes DinningBrad Nichola Warren, MD, MS FAMILY MEDICINE RESIDENT - PGY1 11/20/2016 3:42 PM   Apply and massage in cream from head to toe; leave on for 8 to 14 hours before washing off with water; for infants, also apply on the hairline, neck, scalp, temple, and forehead; may reapply in 14 days if live mites appear.  Scabies, Adult Scabies is a skin condition that happens when very small insects get under the skin (infestation). This causes a rash and severe itchiness. Scabies can spread from person to person (is contagious). If you get scabies, it is common for others in your household to get scabies too. With proper treatment, symptoms usually go away in 2-4 weeks. Scabies usually does not cause lasting problems. What are the causes? This condition is caused by mites (Sarcoptes scabiei, or human itch mites) that can only be seen with a microscope. The mites get into the top layer of skin and lay eggs. Scabies can spread from person to person through:  Close contact with a person who has scabies.  Contact with infested items, such as towels, bedding, or clothing.  What increases the risk? This condition is more likely to develop in:  People who live in nursing homes and other extended-care facilities.  People who have sexual contact with a partner who has scabies.  Young children who attend child care facilities.  People who care for others who are at increased risk for scabies.  What are the signs or symptoms? Symptoms of this condition may include:  Severe itchiness. This is often worse at night.  A rash that includes tiny red bumps or blisters. The rash commonly occurs on the wrist, elbow, armpit, fingers, waist, groin, or  buttocks. Bumps may form a line (burrow) in some areas.  Skin irritation. This can include scaly patches or sores.  How is this diagnosed? This condition is diagnosed with a physical exam. Your health care provider will look closely at your skin. In some cases, your health care provider may take a sample of your affected skin (skin scraping) and have it examined under a microscope. How is this treated? This condition may be treated with:  Medicated cream or lotion that kills the mites. This is spread on the entire body and left on for several hours. Usually, one treatment with medicated cream or lotion is enough to kill all of the mites. In severe cases, the treatment may be repeated.  Medicated cream that relieves itching.  Medicines that help to relieve itching.  Medicines that kill the mites. This treatment is rarely used.  Follow these instructions at home:  Medicines  Take or apply over-the-counter and prescription medicines as told by your health care provider.  Apply medicated cream or lotion as told by your health care provider.  Do not wash off the medicated cream or lotion until the necessary amount of time has passed. Skin Care  Avoid scratching your affected skin.  Keep your fingernails closely trimmed to reduce injury from scratching.  Take cool baths or apply cool washcloths to help reduce itching. General instructions  Clean all items that you recently had contact with, including bedding, clothing, and furniture. Do this on the same day that your treatment starts. ?  Use hot water when you wash items. ? Place unwashable items into closed, airtight plastic bags for at least 3 days. The mites cannot live for more than 3 days away from human skin. ? Vacuum furniture and mattresses that you use.  Make sure that other people who may have been infested are examined by a health care provider. These include members of your household and anyone who may have had contact  with infested items.  Keep all follow-up visits as told by your health care provider. This is important. Contact a health care provider if:  You have itching that does not go away after 4 weeks of treatment.  You continue to develop new bumps or burrows.  You have redness, swelling, or pain in your rash area after treatment.  You have fluid, blood, or pus coming from your rash. This information is not intended to replace advice given to you by your health care provider. Make sure you discuss any questions you have with your health care provider. Document Released: 01/12/2015 Document Revised: 09/29/2015 Document Reviewed: 11/23/2014 Elsevier Interactive Patient Education  [employer].

## 2016-11-21 ENCOUNTER — Other Ambulatory Visit: Payer: Self-pay | Admitting: Family Medicine

## 2016-11-21 ENCOUNTER — Telehealth: Payer: Self-pay | Admitting: Internal Medicine

## 2016-11-21 DIAGNOSIS — B86 Scabies: Secondary | ICD-10-CM

## 2016-11-21 MED ORDER — PERMETHRIN 5 % EX CREA: 1.0000 "application " | TOPICAL_CREAM | Freq: Once | CUTANEOUS | 0 refills | Status: DC

## 2016-11-21 MED ORDER — PERMETHRIN 5 % EX CREA: 1.0000 "application " | TOPICAL_CREAM | Freq: Once | CUTANEOUS | 0 refills | Status: AC

## 2016-11-21 NOTE — Telephone Encounter (Signed)
Please resend RX for cream to [facility].  [name] request this to be done now since she is out and aboutt.

## 2016-11-21 NOTE — Telephone Encounter (Signed)
Pt called because she wasn't able to pick up her prescription from the pharmacy. It looks like in Epic it didn't go through, can we send this again, jw

## 2016-11-21 NOTE — Telephone Encounter (Signed)
Mother is calling back because the pharmacy has not received the prescription and Epic doesn't have a receipt. Can we send this ASAP. jw

## 2016-11-21 NOTE — Telephone Encounter (Signed)
Pt called regarding medication issue. I informed pt the prescription was faxed to the pharmacy before lunch this am. At this point, I think it will be easier to just re print prescription and I will leave up from for her.

## 2016-11-21 NOTE — Telephone Encounter (Signed)
[facility] Sunday SpillersSharon T Saunders, CMA

## 2016-11-21 NOTE — Telephone Encounter (Signed)
Mother is aware.  Garold Sheeler,CMA  

## 2016-11-28 ENCOUNTER — Ambulatory Visit: Payer: Medicaid Other | Admitting: Family Medicine

## 2016-11-28 ENCOUNTER — Encounter: Payer: Self-pay | Admitting: Student

## 2016-11-28 ENCOUNTER — Ambulatory Visit (INDEPENDENT_AMBULATORY_CARE_PROVIDER_SITE_OTHER): Payer: Medicaid Other | Admitting: Student

## 2016-11-28 VITALS — BP 110/70 | HR 101 | Temp 98.4°F | Ht 61.0 in | Wt 101.6 lb

## 2016-11-28 DIAGNOSIS — N898 Other specified noninflammatory disorders of vagina: Secondary | ICD-10-CM

## 2016-11-28 LAB — POCT WET PREP (WET MOUNT)
Clue Cells Wet Prep Whiff POC: NEGATIVE
Trichomonas Wet Prep HPF POC: ABSENT

## 2016-11-28 MED ORDER — FLUCONAZOLE 150 MG PO TABS: 150.0000 mg | ORAL_TABLET | Freq: Once | ORAL | 0 refills | Status: AC

## 2016-11-28 NOTE — Assessment & Plan Note (Signed)
Wet prep negative for BV, yeast or trich. Will try a dose of diflucan given prolonged pruritus which is bothering the patient. GC/CT pending

## 2016-11-28 NOTE — Progress Notes (Signed)
  Subjective:    Erin Wilkins is a 21 y.o. old female here SDA for vaginal discharge  HPI Vaginal dicharge: for two to three days. Discharge is milky. No unusual odor. Reports pruritus for over a month which bothers her a lot. She asks if this is related to condom use. Denies new soap, detergent or douching. Denies vaginal bleeding, dysuria, skin lesion or lumps or bumps. She is sexually active with one female partner for the last 12 months. Reports consistent condom use.  She has [facility]MP 10/18/2016. Period is heavy and prolonged. It lasted about three weeks. Denies dizziness, palpitation or chest pain  PMH/Problem List: has Allergic rhinitis; Cough; Constipation; Pruritus ani; Abnormal vaginal bleeding; Vaginal discharge; and Scabies on her problem list.   has a past medical history of Anemia; Pertussis (03/18/2012); and UTI (urinary tract infection).  FH:  No family history on file.  SH Social History  Substance Use Topics  . Smoking status: Never Smoker  . Smokeless tobacco: Never Used  . Alcohol use No    Review of Systems Review of systems negative except for pertinent positives and negatives in history of present illness above.     Objective:     Vitals:   11/28/16 1553  BP: 110/70  Pulse: (!) 101  Temp: 98.4 F (36.9 C)  TempSrc: Oral  SpO2: 96%  Weight: 101 lb 9.6 oz (46.1 kg)  Height: 5\' 1"  (1.549 m)    Physical Exam GEN: appears well, no apparent distress. Eyes: conjunctiva without injection, sclera anicteric Oropharynx: mmm without erythema or exudation HEM: negative for cervical or periauricular lymphadenopathies CVS: RRR, nl S1&S2, no murmurs, no edema RESP:good air movement bilaterally, CTA GU:   No suprapubic or CVA tenerness External genitalia: normal without surrounding skin lesion, obvious discharge or bleeeding.  Speculum: pink vaginal mucosa, ruggated, normal cervix & discharge. Bimanual: no cervical motion tenderness  or adnexal mass. Uterus appears normal size  MSK: no focal tenderness or notable swelling SKIN: no apparent skin lesion NEURO: alert and oiented appropriately, no gross deficits  PSYCH: euthymic mood with congruent affect    Assessment and Plan:  Vaginal discharge Wet prep negative for BV, yeast or trich. Will try a dose of diflucan given prolonged pruritus which is bothering the patient. GC/CT pending  Orders Placed This Encounter  Procedures  . POCT Wet Prep [facility]   Meds ordered this encounter  Medications  . fluconazole (DIFLUCAN) 150 MG tablet    Sig: Take 1 tablet (150 mg total) by mouth once.    Dispense:  1 tablet    Refill:  0   No Follow-up on file.  Almon Herculesaye T Gonfa, MD 11/28/16 Pager: [phone]

## 2016-11-28 NOTE — Patient Instructions (Signed)
It was great seeing you today! We have addressed the following issues today 1. Vaginal discharge: Your wet prep is negative for bacterial vaginosis, yeast infection and trichomoniasis. I sent a prescription for Diflucan to your pharmacy to see if this helps with the itching  If we did any lab work today, and the results require attention, either me or my nurse will get in touch with you. If everything is normal, you will get a letter in mail and a message via . If you don't hear from us in two weeks, please give us a call. Otherwise, we look forward to seeing you again at your next visit. If you have any questions or concerns before then, please call the clinic at [phone].  Please bring all your medications to every doctors visit  Sign up for My Chart to have easy access to your labs results, and communication with your Primary care physician.    Please check-out at the front desk before leaving the clinic.    Take Care,   Dr. Alanda SlimGonfa

## 2016-11-30 LAB — CERVICOVAGINAL ANCILLARY ONLY
CHLAMYDIA, DNA PROBE: NEGATIVE
NEISSERIA GONORRHEA: NEGATIVE

## 2016-12-05 ENCOUNTER — Ambulatory Visit: Payer: Medicaid Other | Admitting: Internal Medicine

## 2016-12-26 ENCOUNTER — Ambulatory Visit: Payer: Medicaid Other | Admitting: Internal Medicine

## 2017-01-01 ENCOUNTER — Ambulatory Visit: Payer: Medicaid Other | Admitting: Family Medicine

## 2017-01-02 ENCOUNTER — Other Ambulatory Visit (HOSPITAL_COMMUNITY)
Admission: RE | Admit: 2017-01-02 | Discharge: 2017-01-02 | Disposition: A | Discharge status: Home / Self Care | Payer: Medicaid Other | Source: Ambulatory Visit | Attending: Family Medicine | Admitting: Family Medicine

## 2017-01-02 ENCOUNTER — Encounter: Payer: Self-pay | Admitting: Internal Medicine

## 2017-01-02 ENCOUNTER — Ambulatory Visit (INDEPENDENT_AMBULATORY_CARE_PROVIDER_SITE_OTHER): Payer: Medicaid Other | Admitting: Internal Medicine

## 2017-01-02 VITALS — BP 102/78 | HR 87 | Temp 98.1°F | Ht 61.0 in | Wt 103.6 lb

## 2017-01-02 DIAGNOSIS — N898 Other specified noninflammatory disorders of vagina: Secondary | ICD-10-CM

## 2017-01-02 DIAGNOSIS — N760 Acute vaginitis: Secondary | ICD-10-CM | POA: Diagnosis not present

## 2017-01-02 DIAGNOSIS — B9689 Other specified bacterial agents as the cause of diseases classified elsewhere: Secondary | ICD-10-CM

## 2017-01-02 LAB — POCT WET PREP (WET MOUNT)
Clue Cells Wet Prep Whiff POC: NEGATIVE
Trichomonas Wet Prep HPF POC: ABSENT

## 2017-01-02 MED ORDER — METRONIDAZOLE 500 MG PO TABS: 500.0000 mg | ORAL_TABLET | Freq: Two times a day (BID) | ORAL | 0 refills | Status: DC

## 2017-01-02 NOTE — Patient Instructions (Signed)

## 2017-01-02 NOTE — Progress Notes (Signed)
Subjective:    Erin Wilkins - 21 y.o. female MRN [id number]  Date of birth: 1995/05/14  HPI  Erin Wilkins is here for vaginal discharge.  Vaginal Discharge: Has been present for 2 days. Describes discharge as with a foul odor and thin, watery. No pruritis. Sexually active. Requests STD testing. Uses Nexplanon for contraception. Has had intermittent spotting over the past couple of weeks. Denies dysuria, skin lesions, abdominal pain, nausea/vomiting and fevers. Denies dizziness, lightheadedness, passing out. No chest pain or SOB.    -  reports that she has never smoked. She has never used smokeless tobacco. - Review of Systems: Per HPI. - Past Medical History: Patient Active Problem List   Diagnosis Date Noted  . Scabies 11/20/2016  . Vaginal discharge 04/24/2016  . Abnormal vaginal bleeding 12/07/2015  . Pruritus ani 11/17/2015  . Constipation 05/06/2015  . Cough 07/24/2013  . Allergic rhinitis 01/09/2013   - Medications: reviewed and updated   Objective:   Physical Exam BP 102/78   Pulse 87   Temp 98.1 F (36.7 C) (Oral)   Wt 103 lb 9.6 oz (47 kg)   LMP 12/08/2016 (Approximate)   SpO2 97%   BMI 19.58 kg/m  Gen: NAD, alert, cooperative with exam, well-appearing GU/GYN: Exam performed in the presence of a chaperone. External genitalia within normal limits.  Vaginal mucosa pink, moist, normal rugae.  Nonfriable cervix without lesions. Minimal amount of old brown blood present in vaginal vault. No discharge noted but +whiff test.  Bimanual exam revealed normal, nongravid uterus.  No cervical motion tenderness. No adnexal masses bilaterally.      Assessment & Plan:   1. Vaginal discharge - POCT Wet Prep [facility]) - Cervicovaginal ancillary only  2. Bacterial vaginosis Wet prep remarkable for clue cells and bacteria. Correlates with clinical exam findings.  - metroNIDAZOLE (FLAGYL) 500 MG tablet; Take 1 tablet (500 mg total) by mouth 2 (two) times daily.  Dispense: 14  tablet; Refill: 0   Marcy Siren, D.O. 01/02/2017, 2:23 PM PGY-3, [facility]

## 2017-01-03 ENCOUNTER — Telehealth: Payer: Self-pay | Admitting: *Deleted

## 2017-01-03 LAB — CERVICOVAGINAL ANCILLARY ONLY
Chlamydia: NEGATIVE
NEISSERIA GONORRHEA: NEGATIVE

## 2017-01-03 NOTE — Telephone Encounter (Signed)
Pt informed. Zimmerman Rumple, April D, CMA  

## 2017-01-03 NOTE — Telephone Encounter (Signed)
-----   Message from Arvilla Marketatherine Lauren Wallace, DO sent at 01/03/2017  3:34 PM EDT ----- Please call patient to let her know Gonorrhea and Chlamydia were negative.   Marcy Sirenatherine Wallace, D.O. 01/03/2017, 3:34 PM PGY-3, [facility]

## 2017-02-05 ENCOUNTER — Encounter: Payer: Self-pay | Admitting: Internal Medicine

## 2017-02-05 ENCOUNTER — Ambulatory Visit (INDEPENDENT_AMBULATORY_CARE_PROVIDER_SITE_OTHER): Payer: Medicaid Other | Admitting: Internal Medicine

## 2017-02-05 VITALS — BP 88/58 | HR 62 | Temp 98.0°F | Ht 61.0 in | Wt 107.4 lb

## 2017-02-05 DIAGNOSIS — Z0000 Encounter for general adult medical examination without abnormal findings: Secondary | ICD-10-CM | POA: Diagnosis not present

## 2017-02-05 DIAGNOSIS — Z111 Encounter for screening for respiratory tuberculosis: Secondary | ICD-10-CM | POA: Diagnosis not present

## 2017-02-05 NOTE — Progress Notes (Signed)
   Subjective:    Erin Wilkins - 21 y.o. female MRN [id number]  Date of birth: 1996/05/05  HPI  Erin Wilkins is here for annual exam. She reports that she desires to gain weight. She started taking Apetamin syrup today for weight gain; she bought this online. Reports she has felt very sedated throughout today. She has tried consuming large amounts of food in the past but felt this did not help with weight gain.    ROS:  Patient reports no  vision/ hearing changes,anorexia, weight change, fever ,adenopathy, persistant / recurrent hoarseness, swallowing issues, chest pain, edema,persistant / recurrent cough, hemoptysis, dyspnea(rest, exertional, paroxysmal nocturnal), gastrointestinal  bleeding (melena, rectal bleeding), abdominal pain, excessive heart burn, GU symptoms(dysuria, hematuria, pyuria, voiding/incontinence  Issues) syncope, focal weakness, severe memory loss, concerning skin lesions, depression, anxiety, abnormal bruising/bleeding, major joint swelling, breast masses or abnormal vaginal bleeding.     Health Maintenance Due  Topic Date Due  . INFLUENZA VACCINE  12/05/2016    -  reports that she has never smoked. She has never used smokeless tobacco. - Review of Systems: Per HPI. - Past Medical History: Patient Active Problem List   Diagnosis Date Noted  . Scabies 11/20/2016  . Pruritus ani 11/17/2015  . Constipation 05/06/2015  . Allergic rhinitis 01/09/2013   - Medications: reviewed and updated   Objective:   Physical Exam BP (!) 88/58   Pulse 62   Temp 98 F (36.7 C) (Oral)   Ht  (1.549 m)   Wt 107 lb 6.4 oz (48.7 kg)   SpO2 97%   BMI 20.29 kg/m  Gen: NAD, alert, cooperative with exam, well-appearing HEENT: NCAT, PERRL, clear conjunctiva, oropharynx clear, supple neck CV: RRR, good S1/S2, no murmur, no edema, capillary refill brisk  Resp: CTABL, no wheezes, non-labored Abd: SNTND, BS present, no guarding or organomegaly Skin: no rashes, normal turgor  Neuro:  no gross deficits.  Psych: good insight, alert and oriented    Assessment & Plan:   1. Annual physical exam Patient UTD on health maintenance topics, aside from influenza vaccine. Flu vaccine given today.   2. PPD screening test - PPD  3. Desire to Gain Weight Discussed with patient that Apetamin is an anti-histamine which is likely why she is experiencing sedation. It is not FDA approved for use for weight gain. Can be dangerous medication if patient has other co-morbidities. Discussed that her BMI is within healthy range. However, she has had underweight BMI in the recent past. Advised that I would recommend safer means to weight gain such as higher calorie/higher protein foods as opposed to supplements or consuming larger quantities of food. Given list of examples of such food. Also recommended continual exercise and that strength training can help to increase weight.     Marcy Siren, D.O. 02/07/2017, 9:25 AM PGY-3, [facility]

## 2017-02-05 NOTE — Patient Instructions (Signed)
I would recommend stopping the Apetamin given that it is not approved for weight gain use and has potential side effects as we discussed.    You are currently at a healthy BMI but if you want to gain some more weight I would recommend high calorie foods and an increase of protein in your diet. Some recommendations are listed below.    High-Protein and High-Calorie Diet Eating high-protein and high-calorie foods can help you to gain weight, heal after an injury, and recover after an illness or surgery. What is my plan? The specific amount of daily protein and calories you need depends on:  Your body weight.  The reason this diet is recommended for you.  Generally, a high-protein, high-calorie diet involves:  Eating 250-500 extra calories each day.  Making sure that 10-35% of your daily calories come from protein.  Talk to your health care provider about how much protein and how many calories you need each day. Follow the diet as directed by your health care provider. What do I need to know about this diet?  Ask your health care provider if you should take a nutritional supplement.  Try to eat six small meals each day instead of three large meals.  Eat a balanced diet, including one food that is high in protein at each meal.  Keep nutritious snacks handy, such as nuts, trail mixes, dried fruit, and yogurt.  If you have kidney disease or diabetes, eating too much protein may put extra stress on your kidneys. Talk to your health care provider if you have either of those conditions. What are some high-protein foods? Grains Quinoa. Bulgur wheat. Vegetables Soybeans. Peas. Meats and Other Protein Sources Beef, pork, and poultry. Fish and seafood. Eggs. Tofu. Textured vegetable protein (TVP). Peanut butter. Nuts and seeds. Dried beans. Protein powders. Dairy Whole milk. Whole-milk yogurt. Powdered milk. Cheese. [employer]. Eggnog. Beverages High-protein supplement drinks. Soy  milk. Other Protein bars. The items listed above may not be a complete list of recommended foods or beverages. Contact your dietitian for more options. What are some high-calorie foods? Grains Pasta. Quick breads. Muffins. Pancakes. Ready-to-eat cereal. Vegetables Vegetables cooked in oil or butter. Fried potatoes. Fruits Dried fruit. Fruit leather. Canned fruit in syrup. Fruit juice. Avocados. Meats and Other Protein Sources Peanut butter. Nuts and seeds. Dairy Heavy cream. Whipped cream. Cream cheese. Sour cream. Ice cream. Custard. Pudding. Beverages Meal-replacement beverages. Nutrition shakes. Fruit juice. Sugar-sweetened soft drinks. Condiments Salad dressing. Mayonnaise. Alfredo sauce. Fruit preserves or jelly. Honey. Syrup. Sweets/Desserts Cake. Cookies. Pie. Pastries. Candy bars. Chocolate. Fats and Oils Butter or margarine. Oil. Gravy. Other Meal-replacement bars. The items listed above may not be a complete list of recommended foods or beverages. Contact your dietitian for more options. What are some tips for including high-protein and high-calorie foods in my diet?  Add whole milk, half-and-half, or heavy cream to cereal, pudding, soup, or hot cocoa.  Add whole milk to instant breakfast drinks.  Add peanut butter to oatmeal or smoothies.  Add powdered milk to baked goods, smoothies, or milkshakes.  Add powdered milk, cream, or butter to mashed potatoes.  Add cheese to cooked vegetables.  Make whole-milk yogurt parfaits. Top them with granola, fruit, or nuts.  Add cottage cheese to your fruit.  Add avocados, cheese, or both to sandwiches or salads.  Add meat, poultry, or seafood to rice, pasta, casseroles, salads, and soups.  Use mayonnaise when making egg salad, chicken salad, or tuna salad.  Use peanut  butter as a topping for pretzels, celery, or crackers.  Add beans to casseroles, dips, and spreads.  Add pureed beans to sauces and soups.  Replace  calorie-free drinks with calorie-containing drinks, such as milk and fruit juice. This information is not intended to replace advice given to you by your health care provider. Make sure you discuss any questions you have with your health care provider. Document Released: 04/23/2005 Document Revised: 09/29/2015 Document Reviewed: 10/06/2013 Elsevier Interactive Patient Education  [employer].

## 2017-02-08 ENCOUNTER — Ambulatory Visit: Payer: Medicaid Other | Admitting: *Deleted

## 2017-02-08 ENCOUNTER — Telehealth: Payer: Self-pay | Admitting: *Deleted

## 2017-02-08 DIAGNOSIS — Z111 Encounter for screening for respiratory tuberculosis: Secondary | ICD-10-CM

## 2017-02-08 LAB — TB SKIN TEST
Induration: 0 mm
TB Skin Test: NEGATIVE

## 2017-02-08 NOTE — Progress Notes (Signed)
PPD Reading Note PPD read and results entered in Epic. Result: 0 mm induration. Interpretation: negative Allergic reaction: no 

## 2017-02-08 NOTE — Telephone Encounter (Signed)
Patient dropped of physical form during nurse visit that she needs completed for work. Patient would like as soon as possible, placed form in PCP box.

## 2017-02-11 NOTE — Telephone Encounter (Signed)
Pt called to check the status of her forms that she dropped off. She needs these tomorrow. jw

## 2017-02-13 NOTE — Telephone Encounter (Signed)
Reviewed, completed, and signed form.  Note routed to RN team inbasket and placed completed form in Clinic RN's office (wall pocket above desk).  Romulus Hanrahan L Firman Petrow, DO  

## 2017-02-13 NOTE — Telephone Encounter (Signed)
lmovm informing pt "the form you dropped off is ready for pick up, call with questions" Fleeger, Maryjo Rochester, CMA

## 2017-02-28 ENCOUNTER — Ambulatory Visit (INDEPENDENT_AMBULATORY_CARE_PROVIDER_SITE_OTHER): Payer: Medicaid Other | Admitting: Internal Medicine

## 2017-02-28 ENCOUNTER — Encounter: Payer: Self-pay | Admitting: Internal Medicine

## 2017-02-28 VITALS — BP 110/66 | HR 84 | Temp 98.2°F | Ht 61.0 in | Wt 111.4 lb

## 2017-02-28 DIAGNOSIS — Z23 Encounter for immunization: Secondary | ICD-10-CM | POA: Diagnosis present

## 2017-02-28 DIAGNOSIS — S0990XS Unspecified injury of head, sequela: Secondary | ICD-10-CM | POA: Diagnosis present

## 2017-02-28 NOTE — Progress Notes (Signed)
Subjective:    Erin Wilkins - 21 y.o. female MRN [id number]  Date of birth: [birth year]  HPI  Erin Wilkins is here for follow up of head injury. Reports she fell and hit her head on the concrete 5 years ago. Decided that she should get this checked out and make sure everything is okay after the injury. Reports when she fell she hit the left parietal side of her head. She had temporary loss of vision and a headache for 4 days after fall that has since resolved. Patient denies vision changes, focal weakness, confusion, changes in speech. Has a history of migraines and reports these are unchanged. She has not had intractable vomiting.    -  reports that she has never smoked. She has never used smokeless tobacco. - Review of Systems: Per HPI. - Past Medical History: Patient Active Problem List   Diagnosis Date Noted  . Scabies 11/20/2016  . Pruritus ani 11/17/2015  . Constipation 05/06/2015  . Allergic rhinitis 01/09/2013   - Medications: reviewed and updated   Objective:   Physical Exam BP 110/66   Pulse 84   Temp 98.2 F (36.8 C) (Oral)   Ht 5\' 1"  (1.549 m)   Wt 111 lb 6.4 oz (50.5 kg)   SpO2 99%   BMI 21.05 kg/m  Gen: NAD, alert, cooperative with exam, well-appearing HEENT: NCAT, no hematoma or mass present on skull, PERRL, clear conjunctiva, oropharynx clear, supple neck CV: RRR, good S1/S2, no murmur, no edema, capillary refill brisk  Resp: CTABL, no wheezes, non-labored Neuro: CN II-XII grossly intact. Sensation to all extremities intact. Strength 5/5 in all extremities. Normal gait. Normal speech and thought content.     Assessment & Plan:   1. Head trauma, sequela Suspect patient had concussive symptoms in the days immediately after fall. Discussed with patient that risk for intracranial hemorrhage extremely unlikely given remote history of head trauma and normal neurological exam today. Have discussed with patient that I do not anticipate any further problems from this  remote trauma. Follow up prn.   2. Need for immunization against influenza - Flu Vaccine QUAD 36+ mos IM   Marcy Siren, D.O. 02/28/2017, 3:37 PM PGY-3, [facility]

## 2017-02-28 NOTE — Patient Instructions (Signed)
It is very reassuring that you have a normal neurological exam today. I believe you had a concussion immediately after your fall that has resolved with time. I do not expect you to have long term complications from this fall. If you do develop vision changes, weakness in one arm or leg, vomiting with your headache you should be seen again.

## 2017-03-14 ENCOUNTER — Ambulatory Visit (INDEPENDENT_AMBULATORY_CARE_PROVIDER_SITE_OTHER): Payer: Medicaid Other | Admitting: Internal Medicine

## 2017-03-14 ENCOUNTER — Other Ambulatory Visit: Payer: Self-pay

## 2017-03-14 ENCOUNTER — Other Ambulatory Visit (HOSPITAL_COMMUNITY)
Admission: RE | Admit: 2017-03-14 | Discharge: 2017-03-14 | Disposition: A | Discharge status: Home / Self Care | Payer: Medicaid Other | Source: Ambulatory Visit | Attending: Family Medicine | Admitting: Family Medicine

## 2017-03-14 ENCOUNTER — Encounter: Payer: Self-pay | Admitting: Internal Medicine

## 2017-03-14 VITALS — BP 108/60 | HR 83 | Temp 98.4°F | Wt 109.8 lb

## 2017-03-14 DIAGNOSIS — Z124 Encounter for screening for malignant neoplasm of cervix: Secondary | ICD-10-CM | POA: Insufficient documentation

## 2017-03-14 DIAGNOSIS — N898 Other specified noninflammatory disorders of vagina: Secondary | ICD-10-CM

## 2017-03-14 LAB — POCT WET PREP (WET MOUNT)
CLUE CELLS WET PREP WHIFF POC: NEGATIVE
Trichomonas Wet Prep HPF POC: ABSENT

## 2017-03-14 MED ORDER — FLUCONAZOLE 150 MG PO TABS: 150.0000 mg | ORAL_TABLET | Freq: Once | ORAL | 0 refills | Status: AC

## 2017-03-14 NOTE — Progress Notes (Signed)
Subjective:    Erin Wilkins - 21 y.o. female MRN [id number]  Date of birth: [birth year]  HPI  Erin Wilkins is here for vaginal discharge. Reports has been present for about 1 week. Discharge is white, thick and clumpy. She is experiencing associated vaginal itching. Denies foul odor, dysuria, abnormal vaginal bleeding, nausea/vomiting and fevers. Reports she seems to experience itching when using latex condoms. She requests STD screening today.    Health Maintenance Due  Topic Date Due  . PAP SMEAR  02/27/2017    -  reports that  has never smoked. she has never used smokeless tobacco. - Review of Systems: Per HPI. - Past Medical History: Patient Active Problem List   Diagnosis Date Noted  . Scabies 11/20/2016  . Pruritus ani 11/17/2015  . Constipation 05/06/2015  . Allergic rhinitis 01/09/2013   - Medications: reviewed and updated   Objective:   Physical Exam BP 108/60   Pulse 83   Temp 98.4 F (36.9 C) (Oral)   Wt 109 lb 12.8 oz (49.8 kg)   LMP 02/04/2017 (Exact Date)   SpO2 97%   BMI 20.75 kg/m  Gen: NAD, alert, cooperative with exam, well-appearing GU/GYN: Exam performed in the presence of a chaperone. External genitalia within normal limits.  Vaginal mucosa pink, moist, normal rugae.  Nonfriable cervix without lesions or bleeding noted on speculum exam.  Moderate amount of thick white discharge present in vaginal vault. Bimanual exam revealed normal, nongravid uterus.  No cervical motion tenderness. No adnexal masses bilaterally.      Assessment & Plan:   1. Vaginal discharge Wet prep only remarkable for moderate bacteria and WBCs. Given symptoms and exam findings, will treat presumptively for yeast infection. Have sent Rx for Diflucan. Have recommended trying non-latex condoms as well to see if that helps with symptoms.  - POCT Wet Prep [facility]) - Cervicovaginal ancillary only  2. Screening for malignant neoplasm of cervix No prior screening given age.  - Cytology  - PAP [place]   Marcy Siren, D.O. 03/14/2017, 1:55 PM PGY-3, [facility]

## 2017-03-14 NOTE — Patient Instructions (Signed)
I have prescribed Diflucan for a presumed yeast infection. If you are still having itchiness in a few days call back for a second tablet. I would recommend non-latex condoms. Use mild, unscented soaps for showering and do not douche as this can be irritating.

## 2017-03-15 LAB — CERVICOVAGINAL ANCILLARY ONLY
Chlamydia: NEGATIVE
NEISSERIA GONORRHEA: NEGATIVE

## 2017-03-18 LAB — CYTOLOGY - PAP: Diagnosis: NEGATIVE

## 2017-03-20 ENCOUNTER — Telehealth: Payer: Self-pay | Admitting: Internal Medicine

## 2017-03-20 NOTE — Telephone Encounter (Signed)
Pt called and said the Rx she was prescribed didn't work and that she was told by the dr to let her know if it didn't and she would prescribed a second one. Please advise

## 2017-03-21 ENCOUNTER — Encounter: Payer: Self-pay | Admitting: *Deleted

## 2017-03-21 MED ORDER — FLUCONAZOLE 150 MG PO TABS: 150.0000 mg | ORAL_TABLET | Freq: Once | ORAL | 0 refills | Status: AC

## 2017-03-21 NOTE — Telephone Encounter (Signed)
Second Diflucan tablet sent. If this does not relieve symptoms patient will need to return for an appointment.   Marcy Sirenatherine Wallace, D.O. 03/21/2017, 9:45 AM PGY-3, [facility]

## 2017-04-23 ENCOUNTER — Encounter: Payer: Self-pay | Admitting: Family Medicine

## 2017-04-23 ENCOUNTER — Ambulatory Visit: Payer: Self-pay | Admitting: Family Medicine

## 2017-04-23 ENCOUNTER — Other Ambulatory Visit (HOSPITAL_COMMUNITY)
Admission: RE | Admit: 2017-04-23 | Discharge: 2017-04-23 | Disposition: A | Discharge status: Home / Self Care | Payer: Medicaid Other | Source: Ambulatory Visit | Attending: Family Medicine | Admitting: Family Medicine

## 2017-04-23 ENCOUNTER — Other Ambulatory Visit: Payer: Self-pay

## 2017-04-23 VITALS — BP 100/64 | Temp 98.2°F | Ht 61.0 in | Wt 108.6 lb

## 2017-04-23 DIAGNOSIS — N898 Other specified noninflammatory disorders of vagina: Secondary | ICD-10-CM | POA: Insufficient documentation

## 2017-04-23 DIAGNOSIS — Q682 Congenital deformity of knee: Secondary | ICD-10-CM | POA: Diagnosis present

## 2017-04-23 LAB — POCT WET PREP (WET MOUNT)
Clue Cells Wet Prep Whiff POC: NEGATIVE
Trichomonas Wet Prep HPF POC: ABSENT
WBC, Wet Prep HPF POC: >20

## 2017-04-23 NOTE — Assessment & Plan Note (Addendum)
Patient's vaginal discharge is likely physiologic since it is not malodorous and has not improved with two rounds of Diflucan.  Wet prep positive only for moderate WBC, which could be normal or could signify the presence of an STI.  However, she was negative for GC/Chlamydia in November and had a wet prep with moderate WBC at that time as well.  Patient was counseled on this and informed that we will let her know the results of the GC/Chlamydia testing as soon as we know.

## 2017-04-23 NOTE — Progress Notes (Signed)
Subjective:    Erin Wilkins - 21 y.o. female MRN [id number]  Date of birth: October 09, 1995  HPI  Erin Wilkins is here for continued abnormal vaginal discharge and leg .    Abnormal vaginal discharge She has been prescribed Diflucan twice with little improvement.  Wet prep and GC/Chlamydia testing were performed on 03/14/17 with negative results except for bacteria and WBC found on wet prep.  Patient denies foul odor and says it white in color.  She also denies new sexual partners since previous testing, but would like STI testing today just in case.  Knee weakness Ms. Khaled says that for the last year, she has had occasions where her knee has "given out" after she has been standing for about 10 minutes.  This has happened twice in the last month.         Health Maintenance:  There are no preventive care reminders to display for this patient.  -  reports that  has never smoked. she has never used smokeless tobacco. - Review of Systems: Per HPI. - Past Medical History: Patient Active Problem List   Diagnosis Date Noted  . 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 Scabies 11/20/2016  . Pruritus ani 11/17/2015  . Vaginal discharge 05/06/2015  . Constipation 05/06/2015  . Allergic rhinitis 01/09/2013   - Medications: reviewed and updated   Objective:   Physical Exam BP 100/64   Temp 98.2 F (36.8 C) (Oral)   Ht 5\' 1"  (1.549 m)   Wt 108 lb 9.6 oz (49.3 kg)   BMI 20.52 kg/m  Gen: NAD, alert, cooperative with exam, well-appearing HEENT: NCAT, PERRL, clear conjunctiva, oropharynx clear, supple neck CV: RRR, good S1/S2, no murmur, no edema, capillary refill brisk  Resp: CTABL, no wheezes, non-labored Abd: SNTND, BS present, no guarding or organomegaly GU: normal vulva, normal appearing vaginal discharge (white, non-odorous), normal cervix Musculoskeletal: left knee with high-riding patella, that when displaced laterally, causes pain similar to what she is describing.  Negative anterior and posterior  drawer, negative Lochmann, negative collateral ligament pain Skin: no rashes, normal turgor  Neuro: no gross deficits.  Psych: good insight, alert and oriented        Assessment & Plan:   Vaginal discharge Patient's vaginal discharge is likely physiologic since it is not malodorous and has not improved with two rounds of Diflucan.  Wet prep positive only for moderate WBC, which could be normal or could signify the presence of an STI.  However, she was negative for GC/Chlamydia in November and had a wet prep with moderate WBC at that time as well.  Patient was counseled on this and informed that we will let her know the results of the GC/Chlamydia testing as soon as we know.  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  She was shown and given a handout of quadriceps strengthening exercises to hopefully help reduce her knee weakness.    Lezlie Octave, M.D. 04/23/2017, 5:28 PM PGY-1, [facility]

## 2017-04-23 NOTE — Patient Instructions (Addendum)
It was nice meeting you today [name]!  Today, we tested you for a possible infection causing your abnormal discharge.  This discharge may be a normal discharge for you as well, especially because we could not find any yeast on the test today.  However, we will let you know about the other test results as soon as we get them.  It appears that you have a high-riding patella with a possible dislocation in the past.  These following exercises may help with your knee symptoms.  If you have any questions or concerns, please feel free to call the clinic.   Be well,  Dr. Frances FurbishWinfrey  Knee Exercises Ask your health care provider which exercises are safe for you. Do exercises exactly as told by your health care provider and adjust them as directed. It is normal to feel mild stretching, pulling, tightness, or discomfort as you do these exercises, but you should stop right away if you feel sudden pain or your pain gets worse.Do not begin these exercises until told by your health care provider. STRETCHING AND RANGE OF MOTION EXERCISES These exercises warm up your muscles and joints and improve the movement and flexibility of your knee. These exercises also help to relieve pain, numbness, and tingling. Exercise A: Knee Extension, Prone 1. Lie on your abdomen on a bed. 2. Place your left / right knee just beyond the edge of the surface so your knee is not on the bed. You can put a towel under your left / right thigh just above your knee for comfort. 3. Relax your leg muscles and allow gravity to straighten your knee. You should feel a stretch behind your left / right knee. 4. Hold this position for __________ seconds. 5. Scoot up so your knee is supported between repetitions. Repeat __________ times. Complete this stretch __________ times a day. Exercise B: Knee Flexion, Active  1. Lie on your back with both knees straight. If this causes back discomfort, bend your left / right knee so your foot is flat on the  floor. 2. Slowly slide your left / right heel back toward your buttocks until you feel a gentle stretch in the front of your knee or thigh. 3. Hold this position for __________ seconds. 4. Slowly slide your left / right heel back to the starting position. Repeat __________ times. Complete this exercise __________ times a day. Exercise C: Quadriceps, Prone  1. Lie on your abdomen on a firm surface, such as a bed or padded floor. 2. Bend your left / right knee and hold your ankle. If you cannot reach your ankle or pant leg, loop a belt around your foot and grab the belt instead. 3. Gently pull your heel toward your buttocks. Your knee should not slide out to the side. You should feel a stretch in the front of your thigh and knee. 4. Hold this position for __________ seconds. Repeat __________ times. Complete this stretch __________ times a day. Exercise D: Hamstring, Supine 1. Lie on your back. 2. Loop a belt or towel over the ball of your left / right foot. The ball of your foot is on the walking surface, right under your toes. 3. Straighten your left / right knee and slowly pull on the belt to raise your leg until you feel a gentle stretch behind your knee. ? Do not let your left / right knee bend while you do this. ? Keep your other leg flat on the floor. 4. Hold this position for  __________ seconds. Repeat __________ times. Complete this stretch __________ times a day. STRENGTHENING EXERCISES These exercises build strength and endurance in your knee. Endurance is the ability to use your muscles for a long time, even after they get tired. Exercise E: Quadriceps, Isometric  1. Lie on your back with your left / right leg extended and your other knee bent. Put a rolled towel or small pillow under your knee if told by your health care provider. 2. Slowly tense the muscles in the front of your left / right thigh. You should see your kneecap slide up toward your hip or see increased dimpling just  above the knee. This motion will push the back of the knee toward the floor. 3. For __________ seconds, keep the muscle as tight as you can without increasing your pain. 4. Relax the muscles slowly and completely. Repeat __________ times. Complete this exercise __________ times a day. Exercise F: Straight Leg Raises - Quadriceps 1. Lie on your back with your left / right leg extended and your other knee bent. 2. Tense the muscles in the front of your left / right thigh. You should see your kneecap slide up or see increased dimpling just above the knee. Your thigh may even shake a bit. 3. Keep these muscles tight as you raise your leg 4-6 inches (10-15 cm) off the floor. Do not let your knee bend. 4. Hold this position for __________ seconds. 5. Keep these muscles tense as you lower your leg. 6. Relax your muscles slowly and completely after each repetition. Repeat __________ times. Complete this exercise __________ times a day. Exercise G: Hamstring, Isometric 1. Lie on your back on a firm surface. 2. Bend your left / right knee approximately __________ degrees. 3. Dig your left / right heel into the surface as if you are trying to pull it toward your buttocks. Tighten the muscles in the back of your thighs to dig as hard as you can without increasing any pain. 4. Hold this position for __________ seconds. 5. Release the tension gradually and allow your muscles to relax completely for __________ seconds after each repetition. Repeat __________ times. Complete this exercise __________ times a day. Exercise H: Hamstring Curls  If told by your health care provider, do this exercise while wearing ankle weights. Begin with __________ weights. Then increase the weight by 1 lb (0.5 kg) increments. Do not wear ankle weights that are more than __________. 1. Lie on your abdomen with your legs straight. 2. Bend your left / right knee as far as you can without feeling pain. Keep your hips flat against  the floor. 3. Hold this position for __________ seconds. 4. Slowly lower your leg to the starting position.  Repeat __________ times. Complete this exercise __________ times a day. Exercise I: Squats (Quadriceps) 1. Stand in front of a table, with your feet and knees pointing straight ahead. You may rest your hands on the table for balance but not for support. 2. Slowly bend your knees and lower your hips like you are going to sit in a chair. ? Keep your weight over your heels, not over your toes. ? Keep your lower legs upright so they are parallel with the table legs. ? Do not let your hips go lower than your knees. ? Do not bend lower than told by your health care provider. ? If your knee pain increases, do not bend as low. 3. Hold the squat position for __________ seconds. 4. Slowly push with your  legs to return to standing. Do not use your hands to pull yourself to standing. Repeat __________ times. Complete this exercise __________ times a day. Exercise J: Wall Slides (Quadriceps)  1. Lean your back against a smooth wall or door while you walk your feet out 18-24 inches (46-61 cm) from it. 2. Place your feet hip-width apart. 3. Slowly slide down the wall or door until your knees bend __________ degrees. Keep your knees over your heels, not over your toes. Keep your knees in line with your hips. 4. Hold for __________ seconds. Repeat __________ times. Complete this exercise __________ times a day. Exercise K: Straight Leg Raises - Hip Abductors 1. Lie on your side with your left / right leg in the top position. Lie so your head, shoulder, knee, and hip line up. You may bend your bottom knee to help you keep your balance. 2. Roll your hips slightly forward so your hips are stacked directly over each other and your left / right knee is facing forward. 3. Leading with your heel, lift your top leg 4-6 inches (10-15 cm). You should feel the muscles in your outer hip lifting. ? Do not let  your foot drift forward. ? Do not let your knee roll toward the ceiling. 4. Hold this position for __________ seconds. 5. Slowly return your leg to the starting position. 6. Let your muscles relax completely after each repetition. Repeat __________ times. Complete this exercise __________ times a day. Exercise L: Straight Leg Raises - Hip Extensors 1. Lie on your abdomen on a firm surface. You can put a pillow under your hips if that is more comfortable. 2. Tense the muscles in your buttocks and lift your left / right leg about 4-6 inches (10-15 cm). Keep your knee straight as you lift your leg. 3. Hold this position for __________ seconds. 4. Slowly lower your leg to the starting position. 5. Let your leg relax completely after each repetition. Repeat __________ times. Complete this exercise __________ times a day. This information is not intended to replace advice given to you by your health care provider. Make sure you discuss any questions you have with your health care provider. Document Released: 03/07/2005 Document Revised: 01/16/2016 Document Reviewed: 02/27/2015 Elsevier Interactive Patient Education  2018 [employer]

## 2017-04-23 NOTE — Assessment & Plan Note (Signed)
Patient appears to have a high-riding patella with possible subluxation in the past.  She was shown and given a handout of quadriceps strengthening exercises to hopefully help reduce her knee weakness.

## 2017-04-25 LAB — CERVICOVAGINAL ANCILLARY ONLY
BACTERIAL VAGINITIS: NEGATIVE
CHLAMYDIA, DNA PROBE: NEGATIVE
Candida vaginitis: NEGATIVE
NEISSERIA GONORRHEA: NEGATIVE
TRICH (WINDOWPATH): NEGATIVE

## 2017-04-26 ENCOUNTER — Telehealth: Payer: Self-pay | Admitting: *Deleted

## 2017-04-26 NOTE — Telephone Encounter (Signed)
Patient made aware of normal lab from 04/23/2017. Kinnie FeilL. Reba Hulett, RN, BSN

## 2017-05-03 ENCOUNTER — Ambulatory Visit: Payer: Medicaid Other | Admitting: Internal Medicine

## 2017-05-14 ENCOUNTER — Other Ambulatory Visit: Payer: Self-pay

## 2017-05-14 ENCOUNTER — Encounter: Payer: Self-pay | Admitting: Student

## 2017-05-14 ENCOUNTER — Ambulatory Visit (INDEPENDENT_AMBULATORY_CARE_PROVIDER_SITE_OTHER): Payer: Self-pay | Admitting: Student

## 2017-05-14 VITALS — BP 104/68 | HR 116 | Temp 98.1°F | Ht 61.0 in | Wt 108.8 lb

## 2017-05-14 DIAGNOSIS — J069 Acute upper respiratory infection, unspecified: Secondary | ICD-10-CM

## 2017-05-14 DIAGNOSIS — B9789 Other viral agents as the cause of diseases classified elsewhere: Secondary | ICD-10-CM

## 2017-05-14 NOTE — Patient Instructions (Signed)
It was great seeing you today! We have addressed the following issues today  Hearing/ear clogging: This is likely due to common cold.  You have no ear infection.  This should resolve gradually once your common cold clears.  Is come back and see us if no improvement in the next 1-2 weeks.  If we did any lab work today, and the results require attention, either me or my nurse will get in touch with you. If everything is normal, you will get a letter in mail and a message via . If you don't hear from us in two weeks, please give us a call. Otherwise, we look forward to seeing you again at your next visit. If you have any questions or concerns before then, please call the clinic at [phone].  Please bring all your medications to every doctors visit  Sign up for My Chart to have easy access to your labs results, and communication with your Primary care physician.    Please check-out at the front desk before leaving the clinic.    Take Care,   Dr. Alanda SlimGonfa

## 2017-05-14 NOTE — Progress Notes (Signed)
  Subjective:    Erin Wilkins is a 22 y.o. old female here for hearing issue in right ear  HPI Hearing issue: right ear. This is a new issue for the last two days. Had viral URI for 4 days. Her cold symptoms are improving.  She still have runny nose.  However, she continues to experience ear popping and difficulty hearing in the right ear.  She also had headache on the right side that has resolved.  Son with ear infection.  He is on amoxicillin.  Denies trauma, ear ringing, discharge, using Q-tips or new medication.   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   has a past medical history of Anemia, Pertussis (03/18/2012), and UTI (urinary tract infection).  FH:  No family history on file.  SH Social History   Tobacco Use  . Smoking status: Never Smoker  . Smokeless tobacco: Never Used  Substance Use Topics  . Alcohol use: No  . Drug use: No    Review of Systems Review of systems negative except for pertinent positives and negatives in history of present illness above.     Objective:     Vitals:   05/14/17 1628  BP: 104/68  Pulse: (!) 116  Temp: 98.1 F (36.7 C)  TempSrc: Oral  SpO2: 100%  Weight: 108 lb 12.8 oz (49.4 kg)  Height: 5\' 1"  (1.549 m)   Body mass index is 20.56 kg/m.  Physical Exam  GEN: appears well, no apparent distress. Head: normocephalic and atraumatic.  No swelling or erythema over mastoid bones. Ears: external ear, ear canal and TM normal.  No erythema, ear discharge or effusion.  Hears finger rub well bilaterally.  No pain with gentle ear tags or pressure on tragus Nares: some rhinorrhea, congestion or erythema Oropharynx: mmm without erythema or exudation.  HEM: negative for cervical or periauricular lymphadenopathies CVS: RRR, nl s1 & s2, no murmurs, no edema RESP: no IWOB, good air movement bilaterally, CTAB MSK: no focal tenderness or notable  swelling SKIN: Some perioral pustules.  She says this is allergy to her son's amoxicillin. NEURO: alert and oiented appropriately, no gross deficits  PSYCH: euthymic mood with congruent affect Assessment and Plan:  1. Viral URI with cough: history and exam suggestive for viral URTI.  Ear popping and problem with hearing likely due to viral URI.  Ear exam is very reassuring.  Reassured the patient.  -Recommended conservative management including rest and adequate hydration for viral URI -Return to clinic if no improvement in the next 1-2 weeks.   Return if symptoms worsen or fail to improve.  Almon Herculesaye T Gonfa, MD 05/14/17 Pager: [phone]

## 2017-06-11 ENCOUNTER — Encounter: Payer: Self-pay | Admitting: Family Medicine

## 2017-06-11 ENCOUNTER — Other Ambulatory Visit: Payer: Self-pay

## 2017-06-11 ENCOUNTER — Ambulatory Visit (INDEPENDENT_AMBULATORY_CARE_PROVIDER_SITE_OTHER): Payer: Self-pay | Admitting: Family Medicine

## 2017-06-11 VITALS — BP 102/70 | HR 66 | Temp 98.2°F | Wt 108.0 lb

## 2017-06-11 DIAGNOSIS — L309 Dermatitis, unspecified: Secondary | ICD-10-CM | POA: Insufficient documentation

## 2017-06-11 DIAGNOSIS — L308 Other specified dermatitis: Secondary | ICD-10-CM

## 2017-06-11 MED ORDER — EUCERIN EX CREA: TOPICAL_CREAM | CUTANEOUS | 0 refills | Status: DC | PRN

## 2017-06-11 NOTE — Assessment & Plan Note (Signed)
Currently along R back and rear right arm.   Dry, scaly with no pustular/papapular, no bleeding/oozing.   Rx eucerin cream and advised to avoid sustained and frequent very hot showers/baths.

## 2017-06-11 NOTE — Progress Notes (Signed)
    Subjective:  Erin Wilkins is a 22 y.o. female who [facility] with a chief complaint of rash on back.   HPI: Patient is complaining of a few weeks of increasing dry skin and itchy/scaly rash along Right side of back extending onto rear of right arm.   She says it does seem to improve with lotion.  It is itchy but has not been painful/bleeding/pustular.  She has had a hx of scabies in her house but this does not feel like that and no one else in the house has the same symptoms as she has right now.   She does have a hx of sensitive skin.  Objective:  Physical Exam: BP 102/70   Pulse 66   Temp 98.2 F (36.8 C) (Oral)   Wt 108 lb (49 kg)   BMI 20.41 kg/m   Gen: NAD, conversing comfortably CV: RR, no cyanosis  Pulm: NWOB, regular respiratory rate GI: Soft, Nontender, Nondistended. MSK: no edema, cyanosis, or clubbing noted Skin: warm, dry. RASH: along R side of back and R axilla, slight darker discoloration with scaling.  Rash is not pustular or papular, there is no bleeding.   It is consistent with eczema and a quick evaluation of her child did not show any of the same rash.   Neuro: grossly normal, moves all extremities Psych: Normal affect and thought content  No results found for this or any previous visit (from the past 72 hour(s)).   Assessment/Plan:  Eczema Currently along R back and rear right arm.   Dry, scaly with no pustular/papapular, no bleeding/oozing.   Rx eucerin cream and advised to avoid sustained and frequent very hot showers/baths.   Marthenia RollingScott Audi Conover, DO FAMILY MEDICINE RESIDENT - PGY1 06/11/2017 9:54 AM

## 2017-06-11 NOTE — Patient Instructions (Signed)
It was a pleasure to see you today! Thank you for choosing [facility] for your primary care. Erin Wilkins was seen for skin rash/irritation. Come back to the clinic if you have worsening symptoms, and go to the emergency room if you have any life threatening concerns.  It appears your rash is common eczema (dry skin) which often gets worse during the winter.   You can buy eucerin over the counter but we have prescribed it in case your insurance will help pay.  If we did any lab work today, and the results require attention, either me or my nurse will get in touch with you. If everything is normal, you will get a letter in mail and a message via . If you don't hear from us in two weeks, please give us a call. Otherwise, we look forward to seeing you again at your next visit. If you have any questions or concerns before then, please call the clinic at [phone].  Please bring all your medications to every doctors visit  Sign up for My Chart to have easy access to your labs results, and communication with your Primary care physician.    Please check-out at the front desk before leaving the clinic.    Best,  Dr. Marthenia RollingScott Nettie Wilkins FAMILY MEDICINE RESIDENT - PGY1 06/11/2017 9:32 AM

## 2017-06-20 ENCOUNTER — Other Ambulatory Visit (HOSPITAL_COMMUNITY)
Admission: RE | Admit: 2017-06-20 | Discharge: 2017-06-20 | Disposition: A | Discharge status: Home / Self Care | Payer: Self-pay | Source: Ambulatory Visit | Attending: Family Medicine | Admitting: Family Medicine

## 2017-06-20 ENCOUNTER — Encounter: Payer: Self-pay | Admitting: Internal Medicine

## 2017-06-20 ENCOUNTER — Other Ambulatory Visit: Payer: Self-pay

## 2017-06-20 ENCOUNTER — Ambulatory Visit (INDEPENDENT_AMBULATORY_CARE_PROVIDER_SITE_OTHER): Payer: Self-pay | Admitting: Internal Medicine

## 2017-06-20 VITALS — BP 98/64 | HR 56 | Temp 98.4°F | Ht 61.0 in | Wt 108.4 lb

## 2017-06-20 DIAGNOSIS — N898 Other specified noninflammatory disorders of vagina: Secondary | ICD-10-CM | POA: Insufficient documentation

## 2017-06-20 LAB — POCT WET PREP (WET MOUNT)
CLUE CELLS WET PREP WHIFF POC: NEGATIVE
Trichomonas Wet Prep HPF POC: ABSENT
WBC, Wet Prep HPF POC: >20

## 2017-06-20 MED ORDER — METRONIDAZOLE 500 MG PO TABS: 500.0000 mg | ORAL_TABLET | Freq: Two times a day (BID) | ORAL | 0 refills | Status: DC

## 2017-06-20 NOTE — Progress Notes (Signed)
Subjective:    Mellody Florentine - 22 y.o. female MRN [id number]  Date of birth: [birth year]  HPI  Dodie Schwanz is here for vaginal discharge. Reports that her normal amount of vaginal discharge has increased and it now has a foul odor. She denies vaginal itchy, dysuria, pelvic pain, fevers and vomiting. She reports that she has unprotected sexual intercourse and requests vaginal cultures today for STD screening as well.   -  reports that  has never smoked. she has never used smokeless tobacco. - Review of Systems: Per HPI. - Past Medical History: Patient Active Problem List   Diagnosis Date Noted  . Eczema 06/11/2017  . 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 Scabies 11/20/2016  . Pruritus ani 11/17/2015  . Vaginal discharge 05/06/2015  . Constipation 05/06/2015  . Allergic rhinitis 01/09/2013   - Medications: reviewed and updated   Objective:   Physical Exam Ht 5\' 1"  (1.549 m)   Wt 108 lb 6.4 oz (49.2 kg)   LMP 05/20/2017 (Approximate)   BMI 20.48 kg/m  Gen: NAD, alert, cooperative with exam, well-appearing GU/GYN: Exam performed in the presence of a chaperone. External genitalia within normal limits.  Vaginal mucosa pink, moist, normal rugae.  Nonfriable cervix without lesions or bleeding noted on speculum exam. Minimal amount of thin white discharge present on exam. Bimanual exam revealed normal, nongravid uterus.  No cervical motion tenderness. No adnexal masses bilaterally.     Assessment & Plan:   1. Vaginal discharge Wet prep showed a few clue cells. Will treat with Flagyl. Cultures pending. Patient declined HIV and RPR for STD screening. Discussed safe sex.  - Cervicovaginal ancillary only - POCT Wet Prep [facility])   Marcy Siren, D.O. 06/20/2017, 11:20 AM PGY-3, [facility]

## 2017-06-20 NOTE — Patient Instructions (Signed)

## 2017-06-21 LAB — CERVICOVAGINAL ANCILLARY ONLY
CHLAMYDIA, DNA PROBE: NEGATIVE
Neisseria Gonorrhea: NEGATIVE

## 2017-06-24 ENCOUNTER — Telehealth: Payer: Self-pay

## 2017-06-24 NOTE — Telephone Encounter (Signed)
Patient left message on nurse line inquiring about lab results. Ples SpecterAlisa Brake, RN [facility] RN)

## 2017-06-25 NOTE — Telephone Encounter (Signed)
-----   Message from Arvilla Marketatherine Lauren Wallace, DO sent at 06/25/2017  8:59 AM EST ----- Please call patient to let her know Gonorrhea and Chlamydia were negative.   Marcy Sirenatherine Wallace, D.O. 06/25/2017, 8:59 AM PGY-3, [facility]

## 2017-06-25 NOTE — Telephone Encounter (Signed)
Pt informed of neg test result. Sunday SpillersSharon T Saunders, CMA

## 2017-07-30 ENCOUNTER — Ambulatory Visit: Payer: Self-pay | Admitting: Internal Medicine

## 2017-07-31 ENCOUNTER — Ambulatory Visit: Payer: Self-pay | Admitting: Student

## 2017-08-07 ENCOUNTER — Other Ambulatory Visit: Payer: Self-pay

## 2017-08-07 ENCOUNTER — Encounter: Payer: Self-pay | Admitting: Family Medicine

## 2017-08-07 ENCOUNTER — Ambulatory Visit (INDEPENDENT_AMBULATORY_CARE_PROVIDER_SITE_OTHER): Payer: Self-pay | Admitting: Family Medicine

## 2017-08-07 VITALS — BP 102/70 | Temp 98.3°F | Wt 108.0 lb

## 2017-08-07 DIAGNOSIS — N898 Other specified noninflammatory disorders of vagina: Secondary | ICD-10-CM

## 2017-08-07 LAB — POCT WET PREP (WET MOUNT)
CLUE CELLS WET PREP WHIFF POC: NEGATIVE
TRICHOMONAS WET PREP HPF POC: ABSENT

## 2017-08-07 NOTE — Patient Instructions (Signed)
  You can try using Boric Acid Suppositories once a week. You can get these from [employer] [place].  If you have questions or concerns please do not hesitate to call at [phone].  Dolores PattyAngela Sreya Froio, DO PGY-2, [facility] 08/07/2017 12:12 PM

## 2017-08-07 NOTE — Progress Notes (Signed)
Subjective:    Patient ID: Erin Wilkins, female    DOB: May 02, 1996, 22 y.o.   MRN: [id number]   CC: vaginal discharge  HPI: patient complaining of vaginal discharge plus itching. Denies any pelvic pain. No concerns for STD, same partner. Reports using condoms as well as her OCPs. She uses dove sensitive soap. She reports a friend recently stayed over and used her soap and that friend had trich, so she is worried she has trich. She denies dysuria, fevers, chills.   Smoking status reviewed- non-smoker  Review of Systems- see HPI   Objective:  BP 102/70   Temp 98.3 F (36.8 C) (Oral)   Wt 108 lb (49 kg)   LMP 08/01/2017   BMI 20.41 kg/m  Vitals and nursing note reviewed  General: well nourished, in no acute distress HEENT: normocephalic, MMM Neck: supple, non-tender, without lymphadenopathy Extremities: no edema or cyanosis. Skin: warm and dry, no rashes noted Neuro: alert and oriented, no focal deficits  Assessment & Plan:    Vaginal discharge  Recurrent problem for patient. She self swabbed for wet prep which was negative. Reassured patient no trich on swab. Discussed discontinuing soaps, loose clothing. Follow up as needed.     Return if symptoms worsen or fail to improve.   Dolores Patty, DO Family Medicine Resident PGY-2

## 2017-08-07 NOTE — Assessment & Plan Note (Signed)
  Recurrent problem for patient. She self swabbed for wet prep which was negative. Reassured patient no trich on swab. Discussed discontinuing soaps, loose clothing. Follow up as needed.

## 2017-08-12 ENCOUNTER — Ambulatory Visit: Payer: Medicaid Other | Admitting: Internal Medicine

## 2017-08-13 ENCOUNTER — Ambulatory Visit: Payer: Medicaid Other | Admitting: Family Medicine

## 2017-08-13 ENCOUNTER — Other Ambulatory Visit: Payer: Self-pay

## 2017-08-13 ENCOUNTER — Encounter: Payer: Self-pay | Admitting: Family Medicine

## 2017-08-14 NOTE — Progress Notes (Signed)
Opened in error

## 2017-08-16 ENCOUNTER — Ambulatory Visit (INDEPENDENT_AMBULATORY_CARE_PROVIDER_SITE_OTHER): Payer: Self-pay | Admitting: Family Medicine

## 2017-08-16 VITALS — BP 100/60 | HR 78 | Temp 98.4°F

## 2017-08-16 DIAGNOSIS — R300 Dysuria: Secondary | ICD-10-CM

## 2017-08-16 LAB — POCT UA - MICROSCOPIC ONLY

## 2017-08-16 LAB — POCT URINALYSIS DIP (MANUAL ENTRY)
BILIRUBIN UA: NEGATIVE
Glucose, UA: NEGATIVE mg/dL
LEUKOCYTES UA: NEGATIVE
Nitrite, UA: NEGATIVE
PROTEIN UA: NEGATIVE mg/dL
SPEC GRAV UA: 1.025 (ref 1.010–1.025)
Urobilinogen, UA: 1 E.U./dL
pH, UA: 6.5 (ref 5.0–8.0)

## 2017-08-16 MED ORDER — NITROFURANTOIN MONOHYD MACRO 100 MG PO CAPS: 100.0000 mg | ORAL_CAPSULE | Freq: Two times a day (BID) | ORAL | 0 refills | Status: AC

## 2017-08-16 NOTE — Patient Instructions (Signed)
Thank you for coming in today, it was so nice to see you! Today we talked about:    Possible UTI: Your urine did not show any signs of infection.  Your urine did look like you are a little bit dehydrated.  Please drink plenty of water until your urine is clear.    I sent in a prescription for antibiotic to your pharmacy, you will need to take this medicine twice a day for the next 7 days  If you have any questions or concerns, please do not hesitate to call the office at [phone]. You can also message me directly via MyChart.   Sincerely,  Anders Simmondshristina Gambino, MD

## 2017-08-16 NOTE — Progress Notes (Signed)
   Subjective:    Patient ID: Erin Wilkins , female   DOB: 07/27/1995 , 22 y.o..   MRN: [id number]  HPI  Erin Wilkins is here for for a same day visit for  Chief Complaint  Patient presents with  . Dysuria    1. DYSURIA  Pain urinating started 30 days ago. Pain is: noticed when she is urinating Medications tried: None Any antibiotics in the last 30 days: No More than 3 UTIs in the last 12 months: No STD exposure: was recently tested and was negative Possibly pregnant: No, has a nexplanon in place She notes she has had some vaginal bleeding for about 1 month, she is going to be seen next week for this. She was told it was because of her nexplanon  Symptoms Urgency: no Frequency: yes every hour Blood in urine: no Pain in back:no Fever: no Vaginal discharge: no Mouth Ulcers: no  Review of Symptoms - see HPI PMH - Smoking status noted.     Past Medical History: Patient Active Problem List   Diagnosis Date Noted  . Eczema 06/11/2017  . 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 Scabies 11/20/2016  . Pruritus ani 11/17/2015  . Vaginal discharge 05/06/2015  . Constipation 05/06/2015  . Allergic rhinitis 01/09/2013     Social Hx:  reports that she has never smoked. She has never used smokeless tobacco.   Objective:   BP 100/60 (BP Location: Left Arm)   Pulse 78   Temp 98.4 F (36.9 C) (Oral)   LMP 08/01/2017   SpO2 99%  Physical Exam  Gen: NAD, alert, cooperative with exam, well-appearing Cardiac: Regular rate and rhythm, normal S1/S2, no murmur, no edema, capillary refill brisk  Respiratory: Clear to auscultation bilaterally, no wheezes, non-labored breathing Gastrointestinal: soft, non tender, non distended, bowel sounds present Psych: good insight, normal mood and affect   Results for orders placed or performed in visit on 08/16/17  POCT urinalysis dipstick  Result Value Ref Range   Color, UA yellow yellow   Clarity, UA clear clear   Glucose, UA negative negative mg/dL     Bilirubin, UA negative negative   Ketones, POC UA trace (5) (A) negative mg/dL   Spec Grav, UA 1.3081.025 [id number] - 1.025   Blood, UA moderate (A) negative   pH, UA 6.5 5.0 - 8.0   Protein Ur, POC negative negative mg/dL   Urobilinogen, UA 1.0 0.2 or 1.0 E.U./dL   Nitrite, UA Negative Negative   Leukocytes, UA Negative Negative  POCT UA - Microscopic Only  Result Value Ref Range   WBC, Ur, HPF, POC NONE    RBC, urine, microscopic 1-3    Bacteria, U Microscopic TRACE    Mucus, UA 3+    Epithelial cells, urine per micros 1-5     Assessment & Plan:   1. Dysuria: Associated with increased frequency.  UA showing no leukocytes or bacteria but did show trace ketones and blood.  Patient notes that she has been on her period for about 1 month, she is following up next week for this issue.  Given that she is symptomatic we will treat for possible UTI even though UA is not picking up any signs of infection. - POCT urinalysis dipstick - Macrobid 100 mg twice daily times 7 days (has allergy to amoxicillin and penicillin) -Discussed that she can take Azo over-the-counter and see if this helps as well -Return precautions discussed  Anders Simmondshristina Gambino, MD [facility], PGY-3

## 2017-08-18 ENCOUNTER — Encounter (HOSPITAL_COMMUNITY): Payer: Self-pay | Admitting: Emergency Medicine

## 2017-08-18 ENCOUNTER — Emergency Department (HOSPITAL_COMMUNITY)
Admission: EM | Admit: 2017-08-18 | Discharge: 2017-08-19 | Disposition: A | Discharge status: Home / Self Care | Payer: Medicaid Other | Attending: Emergency Medicine | Admitting: Emergency Medicine

## 2017-08-18 DIAGNOSIS — N390 Urinary tract infection, site not specified: Secondary | ICD-10-CM | POA: Insufficient documentation

## 2017-08-18 DIAGNOSIS — R8281 Pyuria: Secondary | ICD-10-CM

## 2017-08-18 DIAGNOSIS — Z7251 High risk heterosexual behavior: Secondary | ICD-10-CM

## 2017-08-18 DIAGNOSIS — Z79899 Other long term (current) drug therapy: Secondary | ICD-10-CM | POA: Insufficient documentation

## 2017-08-18 LAB — URINALYSIS, ROUTINE W REFLEX MICROSCOPIC
Bilirubin Urine: NEGATIVE
GLUCOSE, UA: NEGATIVE mg/dL
Ketones, ur: NEGATIVE mg/dL
Nitrite: NEGATIVE
PH: 6 (ref 5.0–8.0)
Protein, ur: NEGATIVE mg/dL
Specific Gravity, Urine: 1.025 (ref 1.005–1.030)

## 2017-08-18 LAB — COMPREHENSIVE METABOLIC PANEL
ALBUMIN: 3.8 g/dL (ref 3.5–5.0)
ALT: 13 U/L — AB (ref 14–54)
AST: 16 U/L (ref 15–41)
Alkaline Phosphatase: 62 U/L (ref 38–126)
Anion gap: 7 (ref 5–15)
BUN: 15 mg/dL (ref 6–20)
CALCIUM: 9.1 mg/dL (ref 8.9–10.3)
CO2: 23 mmol/L (ref 22–32)
CREATININE: 0.67 mg/dL (ref 0.44–1.00)
Chloride: 112 mmol/L — ABNORMAL HIGH (ref 101–111)
GFR calc Af Amer: >60 mL/min (ref >60)
GFR calc non Af Amer: >60 mL/min (ref >60)
GLUCOSE: 103 mg/dL — AB (ref 65–99)
Potassium: 4 mmol/L (ref 3.5–5.1)
SODIUM: 142 mmol/L (ref 135–145)
Total Bilirubin: 0.3 mg/dL (ref 0.3–1.2)
Total Protein: 6.9 g/dL (ref 6.5–8.1)

## 2017-08-18 LAB — I-STAT BETA HCG BLOOD, ED (MC, WL, AP ONLY): I-stat hCG, quantitative: <5 m[IU]/mL (ref <5)

## 2017-08-18 LAB — CBC
HCT: 35.4 % — ABNORMAL LOW (ref 36.0–46.0)
Hemoglobin: 11.9 g/dL — ABNORMAL LOW (ref 12.0–15.0)
MCH: 30.4 pg (ref 26.0–34.0)
MCHC: 33.6 g/dL (ref 30.0–36.0)
MCV: 90.3 fL (ref 78.0–100.0)
PLATELETS: 248 10*3/uL (ref 150–400)
RBC: 3.92 MIL/uL (ref 3.87–5.11)
RDW: 13.1 % (ref 11.5–15.5)
WBC: 7.8 10*3/uL (ref 4.0–10.5)

## 2017-08-18 LAB — LIPASE, BLOOD: Lipase: 40 U/L (ref 11–51)

## 2017-08-18 NOTE — ED Provider Notes (Signed)
[facility] EMERGENCY DEPARTMENT Provider Note   CSN: [id number] Arrival date & time: 08/18/17  2112    History   Chief Complaint Chief Complaint  Patient presents with  . Abdominal Pain    HPI Erin Wilkins is a 22 y.o. female.  22 year old female with a history of anemia presents to the emergency department for evaluation of abdominal discomfort.  She reports a pulling sensation around her umbilicus as well as in her suprapubic abdomen which has been present over the past few days.  She reports worsening symptoms despite AZO tablets.  Symptoms associated with urinary frequency and urgency.  No complaints of dysuria.  Patient saw her primary care doctor a few days ago who prescribed Macrobid.  The patient has not been taking this antibiotic.  She denies any associated fevers, nausea, vomiting, bowel changes.  Last tested for STDs in February.  She is sexually active with female partners.  No history of abdominal surgeries.     Past Medical History:  Diagnosis Date  . Anemia   . Pertussis 03/18/2012  . UTI (urinary tract infection)     Patient Active Problem List   Diagnosis Date Noted  . Eczema 06/11/2017  . 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 Scabies 11/20/2016  . Pruritus ani 11/17/2015  . Vaginal discharge 05/06/2015  . Constipation 05/06/2015  . Allergic rhinitis 01/09/2013    Past Surgical History:  Procedure Laterality Date  . NO PAST SURGERIES       OB History    Gravida  1   Para  1   Term  1   Preterm      AB      Living  1     SAB      TAB      Ectopic      Multiple  0   Live Births  1            Home Medications    Prior to Admission medications   Medication Sig Start Date End Date Taking? Authorizing Provider  docusate sodium (COLACE) 100 MG capsule Take 1 capsule (100 mg total) by mouth 2 (two) times daily. 06/29/16   Almon Hercules, MD  fluticasone (FLONASE) 50 MCG/ACT nasal spray Place 2 sprays into both nostrils daily.  02/28/16   Almon Hercules, MD  nitrofurantoin, macrocrystal-monohydrate, (MACROBID) 100 MG capsule Take 1 capsule (100 mg total) by mouth 2 (two) times daily for 7 days. 08/16/17 08/23/17  Beaulah Dinning, MD  norethindrone-ethinyl estradiol (LOESTRIN 1/20, 21,) 1-20 MG-MCG tablet Take 1 tablet by mouth daily. 09/03/16   Bonney Aid, MD  Skin Protectants, Misc. (EUCERIN) cream Apply topically as needed for dry skin. 06/11/17   Marthenia Rolling, DO    Family History No family history on file.  Social History Social History   Tobacco Use  . Smoking status: Never Smoker  . Smokeless tobacco: Never Used  Substance Use Topics  . Alcohol use: No  . Drug use: No     Allergies   Amoxicillin; Ancef [cefazolin]; and Penicillins   Review of Systems Review of Systems Ten systems reviewed and are negative for acute change, except as noted in the HPI.    Physical Exam Updated Vital Signs BP 116/71 (BP Location: Right Arm)   Pulse 84   Temp 99.4 F (37.4 C) (Oral)   Resp 14   Ht 5\' 1"  (1.549 m)   Wt 49 kg (108 lb)   LMP 08/01/2017  SpO2 100%   BMI 20.41 kg/m   Physical Exam  Constitutional: She is oriented to person, place, and time. She appears well-developed and well-nourished. No distress.  Nontoxic and in NAD  HENT:  Head: Normocephalic and atraumatic.  Eyes: Conjunctivae and EOM are normal. No scleral icterus.  Neck: Normal range of motion.  Pulmonary/Chest: Effort normal. No respiratory distress.  Respirations even and unlabored  Abdominal:  Soft, nontender, nondistended abdomen.  Genitourinary:  Genitourinary Comments: Yellow discharge from cervix and in vaginal vault. Cervical friability. No CMT or adnexal TTP.  Musculoskeletal: Normal range of motion.  Neurological: She is alert and oriented to person, place, and time.  Skin: Skin is warm and dry. No rash noted. She is not diaphoretic. No erythema. No pallor.  Psychiatric: She has a normal mood and affect. Her  behavior is normal.  Nursing note and vitals reviewed.    ED Treatments / Results  Labs (all labs ordered are listed, but only abnormal results are displayed) Labs Reviewed  WET PREP, GENITAL - Abnormal; Notable for the following components:      Result Value   WBC, Wet Prep HPF POC MANY (*)    All other components within normal limits  COMPREHENSIVE METABOLIC PANEL - Abnormal; Notable for the following components:   Chloride 112 (*)    Glucose, Bld 103 (*)    ALT 13 (*)    All other components within normal limits  CBC - Abnormal; Notable for the following components:   Hemoglobin 11.9 (*)    HCT 35.4 (*)    All other components within normal limits  URINALYSIS, ROUTINE W REFLEX MICROSCOPIC - Abnormal; Notable for the following components:   APPearance HAZY (*)    Hgb urine dipstick SMALL (*)    Leukocytes, UA LARGE (*)    Bacteria, UA RARE (*)    Squamous Epithelial / LPF 6-30 (*)    All other components within normal limits  URINE CULTURE  LIPASE, BLOOD  I-STAT BETA HCG BLOOD, ED ([facility], [facility], [facility] ONLY)  GC/CHLAMYDIA PROBE AMP ([place]) NOT AT [facility]    EKG None  Radiology No results found.  Procedures Procedures (including critical care time)  Medications Ordered in ED Medications  gentamicin (GARAMYCIN) injection 240 mg (240 mg Intramuscular Given 08/19/17 0053)  azithromycin (ZITHROMAX) tablet 2,000 mg (2,000 mg Oral Given 08/19/17 0053)  ondansetron (ZOFRAN-ODT) disintegrating tablet 8 mg (8 mg Oral Given 08/19/17 0042)     12:33 AM Given pyuria with increased white blood cells on wet prep, there is concern for underlying sexually transmitted infection.  Patient treated prophylactically given history in keeping with ACOG 2015 recommendations wrt cephalosporin allergic patients.   Initial Impression / Assessment and Plan / ED Course  I have reviewed the triage vital signs and the nursing notes.  Pertinent labs & imaging results that were available during my  care of the patient were reviewed by me and considered in my medical decision making (see chart for details).     Patient to be discharged with instructions to follow up with her PCP. Discussed importance of using protection when sexually active. Patient understands that they have GC/Chlamydia cultures pending and that they will need to inform all sexual partners if results return positive. Patient has been treated prophylacticly with antibiotics given history, pyuria, and wet prep with increased WBCs. No current concern for PID in light of nontender abdomen, absence of CMP or adnexal TTP.  Laboratory work up reassuring; no leukocytosis.  Patient  to continue tylenol or ibuprofen for pain.  Return precautions discussed and provided. Patient discharged in stable condition with no unaddressed concerns.   Final Clinical Impressions(s) / ED Diagnoses   Final diagnoses:  Pyuria  History of unprotected sex    ED Discharge Orders    None       Antony MaduraHumes, Jatavius Ellenwood, PA-C 08/19/17 0110    Ward, Layla MawKristen N, DO 08/19/17 [phone]

## 2017-08-18 NOTE — ED Triage Notes (Addendum)
Pt c/o pain surrounding umbilicus x 1 hour. Pt denies urinary s/s, pt states she was seen for urinary pain recently but no UTI found. Pt reports pain somewhat relieved in hunched over position. Denies fever, denies n/v/d.

## 2017-08-19 ENCOUNTER — Other Ambulatory Visit: Payer: Self-pay

## 2017-08-19 ENCOUNTER — Encounter: Payer: Self-pay | Admitting: Internal Medicine

## 2017-08-19 ENCOUNTER — Ambulatory Visit (INDEPENDENT_AMBULATORY_CARE_PROVIDER_SITE_OTHER): Payer: Medicaid Other | Admitting: Internal Medicine

## 2017-08-19 VITALS — BP 98/76 | HR 73 | Temp 98.3°F | Ht 61.0 in | Wt 107.0 lb

## 2017-08-19 DIAGNOSIS — Z3046 Encounter for surveillance of implantable subdermal contraceptive: Secondary | ICD-10-CM

## 2017-08-19 LAB — WET PREP, GENITAL
Clue Cells Wet Prep HPF POC: NONE SEEN
SPERM: NONE SEEN
Trich, Wet Prep: NONE SEEN
Yeast Wet Prep HPF POC: NONE SEEN

## 2017-08-19 LAB — GC/CHLAMYDIA PROBE AMP (~~LOC~~) NOT AT ARMC
Chlamydia: POSITIVE — AB
NEISSERIA GONORRHEA: NEGATIVE

## 2017-08-19 MED ORDER — GENTAMICIN SULFATE 40 MG/ML IJ SOLN
240.0000 mg | Freq: Once | INTRAMUSCULAR | Status: AC
  Administered 2017-08-19: 240 mg via INTRAMUSCULAR
  Filled 2017-08-19: qty 6

## 2017-08-19 MED ORDER — ONDANSETRON 4 MG PO TBDP
8.0000 mg | ORAL_TABLET | Freq: Once | ORAL | Status: AC
  Administered 2017-08-19: 8 mg via ORAL
  Filled 2017-08-19: qty 2

## 2017-08-19 MED ORDER — AZITHROMYCIN 250 MG PO TABS
2000.0000 mg | ORAL_TABLET | Freq: Once | ORAL | Status: AC
  Administered 2017-08-19: 2000 mg via ORAL
  Filled 2017-08-19: qty 8

## 2017-08-19 NOTE — Discharge Instructions (Addendum)
Your urine today is concerning for possible underlying sexually transmitted infection.  You were treated prophylactically with antibiotics in the emergency department to cover you for STDs.  Follow-up on the results of your STD test with the health department in 48 hours.  If positive, notify all partners of their need to be tested and treated for STDs.  Do not engage in sexual intercourse for 1 week.  Until STD tests result, we do recommend continued treatment for urinary tract infection with the antibiotics prescribed by her primary care doctor.  Begin this on the morning of 08/19/2017 and take as prescribed until finished.  Follow-up with your primary care doctor to ensure resolution of symptoms.

## 2017-08-19 NOTE — Progress Notes (Signed)
Subjective:    Kaory Aston - 22 y.o. female MRN [id number]  Date of birth: 03-18-1996  HPI  Orella Arena is here for concerns with her Nexplanon. Reports she occasionally has a pulling sensation and aching in the area of her Nexplanon. This occurs very infrequently but has been occurring for the past several months. Nexplanon was placed >2 years ago. Denies overlying erythema, increased warmth or rash in the area. Denies numbness or tingling of the extremity or weakness of the extremity. Afebrile.    -  reports that she has never smoked. She has never used smokeless tobacco. - Review of Systems: Per HPI. - Past Medical History: Patient Active Problem List   Diagnosis Date Noted  . Eczema 06/11/2017  . 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 Scabies 11/20/2016  . Pruritus ani 11/17/2015  . Vaginal discharge 05/06/2015  . Constipation 05/06/2015  . Allergic rhinitis 01/09/2013   - Medications: reviewed and updated   Objective:   Physical Exam BP 98/76   Pulse 73   Temp 98.3 F (36.8 C) (Oral)   Ht 5\' 1"  (1.549 m)   Wt 107 lb (48.5 kg)   LMP 08/01/2017   SpO2 99%   BMI 20.22 kg/m  Gen: NAD, alert, cooperative with exam, well-appearing Skin: Nexplanon palpated in appropriate location in left UE without overlying erythema, ecchymosis or increased warmth. Seems to have some fibrosis in the SQ fascia around the Nexplanon. Palpation does not cause pain.     Assessment & Plan:   1. Encounter for surveillance of implantable subdermal contraceptive Suspect is having some occasional discomfort with arm movements secondary to scar tissue formation around the Nexplanon. No evidence of infection on exam. No concerning features on history. Does not seem to be impacting daily life significantly or causing significant pain or any impairment. Discussed that would leave in for the 3 years and patient is agreeable. Briefly discussed that may want to consider other LARC (IUD) in the future.    Marcy Siren, D.O. 08/19/2017, 2:02 PM PGY-3, [facility]

## 2017-08-19 NOTE — Patient Instructions (Signed)
It is common to have some discomfort from the scar tissue built up around the Nexplanon. I would worry if you had redness of the skin, intense constant pain, weakness of your left arm.

## 2017-08-20 LAB — URINE CULTURE: Culture: NO GROWTH

## 2017-08-21 ENCOUNTER — Ambulatory Visit (INDEPENDENT_AMBULATORY_CARE_PROVIDER_SITE_OTHER): Payer: Medicaid Other

## 2017-08-21 DIAGNOSIS — A749 Chlamydial infection, unspecified: Secondary | ICD-10-CM | POA: Diagnosis not present

## 2017-08-21 MED ORDER — AZITHROMYCIN 500 MG PO TABS
1000.0000 mg | ORAL_TABLET | Freq: Once | ORAL | Status: AC
  Administered 2017-08-21: 1000 mg via ORAL

## 2017-08-21 NOTE — Progress Notes (Signed)
Patient in nurse clinic today for STD treatment of Chlamydia. Patient advised to abstain from sex for 7-10 days after treatment or when partner has been tested/treated.  Azithromycin 1 GM PO x 1 given per Dr. Philis PiqueWallace's orders.  Patient to follow up in 2-3 months for re-screening.  Advised to use condoms with all sexual activity.  STD report [facility] fax completed and [facility] at [phone] (STD department).  Patient verbalized understanding.  Shawna OrleansMeredith B Thomsen, RN

## 2017-09-02 ENCOUNTER — Other Ambulatory Visit: Payer: Self-pay | Admitting: Internal Medicine

## 2017-09-02 ENCOUNTER — Telehealth: Payer: Self-pay | Admitting: Internal Medicine

## 2017-09-02 MED ORDER — FLUCONAZOLE 150 MG PO TABS: ORAL_TABLET | ORAL | 0 refills | Status: DC

## 2017-09-02 NOTE — Telephone Encounter (Signed)
Pt wants to know if she can have a medication sent to the pharmacy for a yeast infection.

## 2017-09-02 NOTE — Progress Notes (Signed)
Have sent in Diflucan to pharmacy. Suspect patient with yeast infection given recent antibiotic treatment for +Chlamydia result.   Marcy Siren, D.O. 09/02/2017, 10:59 AM PGY-3, [facility]

## 2017-09-06 ENCOUNTER — Encounter: Payer: Self-pay | Admitting: Internal Medicine

## 2017-09-06 ENCOUNTER — Ambulatory Visit (INDEPENDENT_AMBULATORY_CARE_PROVIDER_SITE_OTHER): Payer: Self-pay | Admitting: Internal Medicine

## 2017-09-06 ENCOUNTER — Other Ambulatory Visit (HOSPITAL_COMMUNITY)
Admission: RE | Admit: 2017-09-06 | Discharge: 2017-09-06 | Disposition: A | Discharge status: Home / Self Care | Payer: Medicaid Other | Source: Ambulatory Visit | Attending: Family Medicine | Admitting: Family Medicine

## 2017-09-06 VITALS — BP 100/70 | HR 101 | Temp 98.6°F | Ht 61.0 in | Wt 107.8 lb

## 2017-09-06 DIAGNOSIS — Z202 Contact with and (suspected) exposure to infections with a predominantly sexual mode of transmission: Secondary | ICD-10-CM | POA: Diagnosis present

## 2017-09-06 DIAGNOSIS — R350 Frequency of micturition: Secondary | ICD-10-CM

## 2017-09-06 LAB — POCT URINALYSIS DIP (MANUAL ENTRY)
Bilirubin, UA: NEGATIVE
Blood, UA: NEGATIVE
Glucose, UA: NEGATIVE mg/dL
NITRITE UA: NEGATIVE
UROBILINOGEN UA: 0.2 U/dL
pH, UA: 6 (ref 5.0–8.0)

## 2017-09-06 LAB — POCT UA - MICROSCOPIC ONLY: Epithelial cells, urine per micros: >20

## 2017-09-06 NOTE — Assessment & Plan Note (Addendum)
Recently patient was diagnosed with chlamydia.  She was treated for this pathogen She would like a retest to ensure that she is cleared it.  Will provide patient with self swab for [facility] Request no letter be sent to her house

## 2017-09-06 NOTE — Assessment & Plan Note (Signed)
Recently treated for urinary tract infection.  Symptoms have mostly resolved.  She does note some increased frequency of urination however that could be from increase fluid intake.   -Will obtain a UA to determine if any concern for further infection

## 2017-09-06 NOTE — Progress Notes (Signed)
   [name] Noralee Chars, MD Phone: [phone]  Reason For Visit: SDA   #Urine frequency : She was recently treated for a UTI.  She states that the pain with urination has improved.  She still feels like she has increased frequency of urination.  She wants to be checked to make sure that the urinary tract infection has resolved.  States she did complete the course of nitrofurantoin that she was prescribed.  And had no issues with taking that medication.  Denies any fevers or chills.  She denies any pelvic pain. She   #STD check -She states that she was recently diagnosed with chlamydia as she received a treatment for this pathogen.  However she would like to be rechecked to make sure that it has been cleared completely.  She denies any vaginal discharge.  She denies any vaginal symptoms.  She is not sexually active with that partner currently.  Denies any pain with sexual activity.  She denies any fevers or chills.  She denies any pelvic pain.    Past Medical History Reviewed problem list.  Medications- reviewed and updated No additions to family history Social history- patient is a non smoker  Objective: BP 100/70 (BP Location: Right Arm, Patient Position: Sitting, Cuff Size: Normal)   Pulse (!) 101   Temp 98.6 F (37 C) (Oral)   Ht  (1.549 m)   Wt 107 lb 12.8 oz (48.9 kg)   SpO2 97%   BMI 20.37 kg/m  Gen: NAD, alert, cooperative with exam  MSK: Normal gait and station Skin: dry, intact, no rashes or lesions    Assessment/Plan: See problem based a/p  Urine frequency Recently treated for urinary tract infection.  Symptoms have mostly resolved.  She does note some increased frequency of urination however that could be from increase fluid intake.   -Will obtain a UA to determine if any concern for further infection  STD exposure Recently patient was diagnosed with chlamydia.  She was treated for this pathogen She would like a retest to ensure  that she is cleared it.  Will provide patient with self swab for [facility]

## 2017-09-06 NOTE — Patient Instructions (Signed)
Either call you with results if there are anything that is positive or we will send you a letter in the mail

## 2017-09-11 LAB — CERVICOVAGINAL ANCILLARY ONLY
CHLAMYDIA, DNA PROBE: NEGATIVE
NEISSERIA GONORRHEA: NEGATIVE

## 2017-10-01 ENCOUNTER — Ambulatory Visit: Payer: Medicaid Other | Admitting: Internal Medicine

## 2017-10-04 ENCOUNTER — Other Ambulatory Visit (HOSPITAL_COMMUNITY)
Admission: RE | Admit: 2017-10-04 | Discharge: 2017-10-04 | Disposition: A | Discharge status: Home / Self Care | Payer: Medicaid Other | Source: Ambulatory Visit | Attending: Family Medicine | Admitting: Family Medicine

## 2017-10-04 ENCOUNTER — Ambulatory Visit (INDEPENDENT_AMBULATORY_CARE_PROVIDER_SITE_OTHER): Payer: Self-pay | Admitting: Family Medicine

## 2017-10-04 ENCOUNTER — Other Ambulatory Visit: Payer: Self-pay

## 2017-10-04 ENCOUNTER — Encounter: Payer: Self-pay | Admitting: Family Medicine

## 2017-10-04 VITALS — BP 102/68 | HR 74 | Temp 98.6°F | Ht 61.0 in | Wt 105.0 lb

## 2017-10-04 DIAGNOSIS — N898 Other specified noninflammatory disorders of vagina: Secondary | ICD-10-CM | POA: Diagnosis present

## 2017-10-04 DIAGNOSIS — Z202 Contact with and (suspected) exposure to infections with a predominantly sexual mode of transmission: Secondary | ICD-10-CM

## 2017-10-04 DIAGNOSIS — R350 Frequency of micturition: Secondary | ICD-10-CM

## 2017-10-04 LAB — POCT URINALYSIS DIP (MANUAL ENTRY)
BILIRUBIN UA: NEGATIVE
GLUCOSE UA: NEGATIVE mg/dL
Leukocytes, UA: NEGATIVE
NITRITE UA: NEGATIVE
RBC UA: NEGATIVE
Spec Grav, UA: 1.025 (ref 1.010–1.025)
Urobilinogen, UA: 0.2 E.U./dL
pH, UA: 6.5 (ref 5.0–8.0)

## 2017-10-04 LAB — POCT WET PREP (WET MOUNT)
Clue Cells Wet Prep Whiff POC: NEGATIVE
Trichomonas Wet Prep HPF POC: ABSENT

## 2017-10-04 NOTE — Progress Notes (Signed)
     Subjective: Chief Complaint  Patient presents with  . stomach cramping, back pain, STD testing   HPI: Erin Wilkins is a 22 y.o. presenting to clinic today to discuss the following:  Stomach cramps with increase in urinary frequency Erin Wilkins is a 21y/o female with positive history of STD in the past. She is hear b/c she had recently engaged in unprotected sex with her partner at the time after a period where they had "broken up". She states she is having no symptoms except some stomach cramping and back pain that have been ongoing for her. She wants testing because she has again separated from her partner due to her suspicion that he may have been having sexual relations outside the relationship.  She is not having any rash, itching, discharge, abnormal or unpleasant odors in her genital region.  She denies any pain during intercouse, fever, nausea, vomiting, urinary burning or pain She does endorse urinary frequency.  Health Maintenance: none due today     ROS noted in HPI.   Past Medical, Surgical, Social, and Family History Reviewed & Updated per EMR.   Pertinent Historical Findings include:   Social History   Tobacco Use  Smoking Status Never Smoker  Smokeless Tobacco Never Used      Objective: BP 102/68   Pulse 74   Temp 98.6 F (37 C) (Oral)   Ht  (1.549 m)   Wt 105 lb (47.6 kg)   SpO2 99%   BMI 19.84 kg/m  Vitals and nursing notes reviewed  Physical Exam  Constitutional: She appears well-developed and well-nourished.  HENT:  Head: Normocephalic and atraumatic.  Eyes: Pupils are equal, round, and reactive to light. Conjunctivae and EOM are normal.  Cardiovascular: Normal rate, regular rhythm and intact distal pulses.  No murmur heard. Pulmonary/Chest: Effort normal and breath sounds normal. No respiratory distress.  Abdominal: Soft. Bowel sounds are normal. She exhibits no distension. There is tenderness. There is no rebound and no guarding.    Genitourinary: Vagina normal and uterus normal. There is no rash, tenderness or lesion on the right labia. There is no rash, tenderness or lesion on the left labia. No vaginal discharge found.  Genitourinary Comments: On bimanual exam, she did not have any cervical tenderness  Skin: Skin is warm and dry. Capillary refill takes less than 2 seconds. No rash noted. No erythema.    No results found for this or any previous visit (from the past 72 hour(s)).  Assessment/Plan:  Urine frequency U/A today which was negative for any sign of UTI  STD exposure Repeat GC/CC swab and wet prep today.  Results were positive for Chlamydia which resulted today. Will call patient and have her come in to get treatment with 1g as single dose   PATIENT EDUCATION PROVIDED: See AVS    Diagnosis and plan along with any newly prescribed medication(s) were discussed in detail with this patient today. The patient verbalized understanding and agreed with the plan. Patient advised if symptoms worsen return to clinic or ER.   Health Maintainance:   Orders Placed This Encounter  Procedures  . HIV antibody (with reflex)  . RPR  . POCT Wet Prep [employer])  . POCT urinalysis dipstick    No orders of the defined types were placed in this encounter.    Jules Schick, DO 10/04/2017, 3:59 PM PGY-1, [facility]

## 2017-10-04 NOTE — Patient Instructions (Signed)
It was great to meet you today! Thank you for letting me participate in your care!  Today, we discussed your continued abdominal cramps and pain. I retested you and also did a test on your urine. I will call you with an update on your results.  If anything is abnormal I will send the appropriate medication if needed to the pharmacy.  Be well, Jules Schick, DO PGY-1, [name]

## 2017-10-05 LAB — HIV ANTIBODY (ROUTINE TESTING W REFLEX): HIV SCREEN 4TH GENERATION: NONREACTIVE

## 2017-10-05 LAB — RPR: RPR Ser Ql: NONREACTIVE

## 2017-10-07 LAB — CERVICOVAGINAL ANCILLARY ONLY
Chlamydia: POSITIVE — AB
NEISSERIA GONORRHEA: NEGATIVE

## 2017-10-09 ENCOUNTER — Other Ambulatory Visit: Payer: Self-pay | Admitting: Family Medicine

## 2017-10-09 ENCOUNTER — Ambulatory Visit: Payer: Medicaid Other

## 2017-10-09 ENCOUNTER — Telehealth: Payer: Self-pay | Admitting: *Deleted

## 2017-10-09 MED ORDER — AZITHROMYCIN 500 MG PO TABS: 500.0000 mg | ORAL_TABLET | Freq: Every day | ORAL | 0 refills | Status: DC

## 2017-10-09 NOTE — Telephone Encounter (Signed)
Contacted pt and informed her of positive Chlamydia test per Dr. Karen ChafeLockamy. Pt scheduled for today to be treated in nurse clinic. Erin Wilkins, April D, [place]

## 2017-10-09 NOTE — Assessment & Plan Note (Signed)
U/A today which was negative for any sign of UTI

## 2017-10-09 NOTE — Assessment & Plan Note (Addendum)
Repeat GC/CC swab and wet prep today.  Results were positive for Chlamydia which resulted today. Will call patient and have her come in to get treatment with 1g as single dose

## 2017-10-09 NOTE — Progress Notes (Signed)
Patient was called and informed of positive Chlamydia result. Patient will come in on 6/6 for nurse visit to receive treatment.

## 2017-10-14 ENCOUNTER — Ambulatory Visit (INDEPENDENT_AMBULATORY_CARE_PROVIDER_SITE_OTHER): Payer: Medicaid Other | Admitting: *Deleted

## 2017-10-14 DIAGNOSIS — A749 Chlamydial infection, unspecified: Secondary | ICD-10-CM

## 2017-10-14 MED ORDER — AZITHROMYCIN 500 MG PO TABS
1000.0000 mg | ORAL_TABLET | Freq: Once | ORAL | Status: AC
  Administered 2017-10-14: 1000 mg via ORAL

## 2017-10-14 NOTE — Progress Notes (Signed)
Pt presents today for STD treatment.  Clarified that MD meant for pt to have 1 gram of azithro with Dr. Gwendolyn GrantWalden.  Azithro given, pt tolerated well.  Communicable disease report completed and sent to [facility]. [name], [name], CMA

## 2017-10-22 ENCOUNTER — Ambulatory Visit (INDEPENDENT_AMBULATORY_CARE_PROVIDER_SITE_OTHER): Payer: Self-pay | Admitting: Internal Medicine

## 2017-10-22 ENCOUNTER — Encounter: Payer: Self-pay | Admitting: Internal Medicine

## 2017-10-22 VITALS — BP 102/70 | HR 115 | Temp 98.6°F | Wt 102.0 lb

## 2017-10-22 DIAGNOSIS — M791 Myalgia, unspecified site: Secondary | ICD-10-CM

## 2017-10-22 NOTE — Progress Notes (Signed)
Subjective:   Patient: Erin Wilkins       Birthdate: 03/20/1996       MRN: [id number]      HPI  Erin Wilkins is a 22 y.o. female presenting for same day appt for myalgias, fever.   Myalgias, fever Began yesterday. Initially with myalgias and chills, then subjective fevers this morning. Took Nyquil last night and ibuprofen this AM. Feeling better but still feels a bit "weird." Denies cough, nasal congestion, sore throat. Eating and drinking normally. Was around a baby yesterday with a subjective fever who sneezed on her phone. Also has a young son, though he does not seem sick currently. Denies vomiting, diarrhea. Has Nexplanon in place so spots frequently and does not have regular menstrual periods.   Smoking status reviewed. Patient is never smoker.   Review of Systems See HPI.     Objective:  Physical Exam  Constitutional: She is oriented to person, place, and time. She appears well-developed and well-nourished. No distress.  HENT:  Head: Normocephalic and atraumatic.  Nose: Nose normal.  Mouth/Throat: Oropharynx is clear and moist. No oropharyngeal exudate.  Eyes: Conjunctivae and EOM are normal. Right eye exhibits no discharge. Left eye exhibits no discharge.  Neck: Normal range of motion. Neck supple.  Cardiovascular: Normal rate, regular rhythm and normal heart sounds.  No murmur heard. Pulmonary/Chest: Effort normal and breath sounds normal. No respiratory distress. She has no wheezes.  Lymphadenopathy:    She has no cervical adenopathy.  Neurological: She is alert and oriented to person, place, and time.  Skin: Skin is warm and dry.  Psychiatric: She has a normal mood and affect. Her behavior is normal.      Assessment & Plan:  Myalgias, subjective fever Likely viral etiology. No confirmed fever and afebrile in office, which is reassuring. Eating and drinking normally and well-hydrated on exam. Generally very well-appearing. No cough and lungs CTAB, so less likely  respiratory illness. Discussed ibuprofen and/or Tylenol for myalgias and fevers, as well as remaining well-hydrated.   Tarri Abernethy, MD, MPH PGY-3 [name] Pager [phone]

## 2017-10-22 NOTE — Patient Instructions (Signed)
It was nice meeting you today Erin Wilkins!  For body aches and fevers, you can take ibuprofen or Tylenol. Make sure you are drinking plenty of water so you do not get dehydrated.   To help prevent spreading a virus to anyone else, make sure you wash your hands frequently and that no one eats or drinks after you.   If you have any questions or concerns, please feel free to call the clinic.   Be well,  Dr. Natale MilchLancaster

## 2017-10-23 ENCOUNTER — Other Ambulatory Visit: Payer: Self-pay

## 2017-10-23 ENCOUNTER — Emergency Department (HOSPITAL_COMMUNITY)
Admission: EM | Admit: 2017-10-23 | Discharge: 2017-10-23 | Disposition: A | Discharge status: Home / Self Care | Payer: Self-pay | Attending: Emergency Medicine | Admitting: Emergency Medicine

## 2017-10-23 ENCOUNTER — Emergency Department (HOSPITAL_COMMUNITY): Payer: Self-pay

## 2017-10-23 ENCOUNTER — Telehealth: Payer: Self-pay | Admitting: Internal Medicine

## 2017-10-23 ENCOUNTER — Encounter (HOSPITAL_COMMUNITY): Payer: Self-pay

## 2017-10-23 DIAGNOSIS — R740 Nonspecific elevation of levels of transaminase and lactic acid dehydrogenase [LDH]: Secondary | ICD-10-CM | POA: Insufficient documentation

## 2017-10-23 DIAGNOSIS — K5900 Constipation, unspecified: Secondary | ICD-10-CM | POA: Insufficient documentation

## 2017-10-23 DIAGNOSIS — R945 Abnormal results of liver function studies: Secondary | ICD-10-CM | POA: Insufficient documentation

## 2017-10-23 DIAGNOSIS — R509 Fever, unspecified: Secondary | ICD-10-CM | POA: Insufficient documentation

## 2017-10-23 DIAGNOSIS — Z209 Contact with and (suspected) exposure to unspecified communicable disease: Secondary | ICD-10-CM | POA: Insufficient documentation

## 2017-10-23 DIAGNOSIS — R7989 Other specified abnormal findings of blood chemistry: Secondary | ICD-10-CM

## 2017-10-23 DIAGNOSIS — R7401 Elevation of levels of liver transaminase levels: Secondary | ICD-10-CM

## 2017-10-23 DIAGNOSIS — R110 Nausea: Secondary | ICD-10-CM | POA: Insufficient documentation

## 2017-10-23 LAB — LIPASE, BLOOD: LIPASE: 31 U/L (ref 11–51)

## 2017-10-23 LAB — COMPREHENSIVE METABOLIC PANEL
ALT: 532 U/L — ABNORMAL HIGH (ref 14–54)
ANION GAP: 10 (ref 5–15)
AST: 496 U/L — ABNORMAL HIGH (ref 15–41)
Albumin: 4.1 g/dL (ref 3.5–5.0)
Alkaline Phosphatase: 156 U/L — ABNORMAL HIGH (ref 38–126)
BILIRUBIN TOTAL: 0.9 mg/dL (ref 0.3–1.2)
BUN: 14 mg/dL (ref 6–20)
CHLORIDE: 106 mmol/L (ref 101–111)
CO2: 24 mmol/L (ref 22–32)
Calcium: 8.7 mg/dL — ABNORMAL LOW (ref 8.9–10.3)
Creatinine, Ser: 0.73 mg/dL (ref 0.44–1.00)
GFR calc Af Amer: >60 mL/min (ref >60)
Glucose, Bld: 97 mg/dL (ref 65–99)
POTASSIUM: 3.2 mmol/L — AB (ref 3.5–5.1)
Sodium: 140 mmol/L (ref 135–145)
TOTAL PROTEIN: 7.6 g/dL (ref 6.5–8.1)

## 2017-10-23 LAB — CBC
HEMATOCRIT: 39.7 % (ref 36.0–46.0)
Hemoglobin: 13.6 g/dL (ref 12.0–15.0)
MCH: 30.8 pg (ref 26.0–34.0)
MCHC: 34.3 g/dL (ref 30.0–36.0)
MCV: 90 fL (ref 78.0–100.0)
Platelets: 209 10*3/uL (ref 150–400)
RBC: 4.41 MIL/uL (ref 3.87–5.11)
RDW: 13 % (ref 11.5–15.5)
WBC: 3.4 10*3/uL — AB (ref 4.0–10.5)

## 2017-10-23 LAB — URINALYSIS, ROUTINE W REFLEX MICROSCOPIC
Bilirubin Urine: NEGATIVE
Glucose, UA: NEGATIVE mg/dL
HGB URINE DIPSTICK: NEGATIVE
Ketones, ur: 20 mg/dL — AB
NITRITE: NEGATIVE
Protein, ur: 30 mg/dL — AB
SPECIFIC GRAVITY, URINE: 1.027 (ref 1.005–1.030)
pH: 5 (ref 5.0–8.0)

## 2017-10-23 LAB — I-STAT CG4 LACTIC ACID, ED: Lactic Acid, Venous: 1.36 mmol/L (ref 0.5–1.9)

## 2017-10-23 LAB — ETHANOL: Alcohol, Ethyl (B): <10 mg/dL (ref <10)

## 2017-10-23 LAB — I-STAT BETA HCG BLOOD, ED (MC, WL, AP ONLY)

## 2017-10-23 LAB — CK: Total CK: 42 U/L (ref 38–234)

## 2017-10-23 LAB — ACETAMINOPHEN LEVEL: Acetaminophen (Tylenol), Serum: <10 ug/mL — ABNORMAL LOW (ref 10–30)

## 2017-10-23 LAB — MONONUCLEOSIS SCREEN: Mono Screen: NEGATIVE

## 2017-10-23 MED ORDER — SODIUM CHLORIDE 0.9 % IV BOLUS
1000.0000 mL | Freq: Once | INTRAVENOUS | Status: AC
  Administered 2017-10-23: 1000 mL via INTRAVENOUS

## 2017-10-23 MED ORDER — ONDANSETRON 4 MG PO TBDP: 4.0000 mg | ORAL_TABLET | Freq: Three times a day (TID) | ORAL | 0 refills | Status: DC | PRN

## 2017-10-23 MED ORDER — IBUPROFEN 800 MG PO TABS
800.0000 mg | ORAL_TABLET | Freq: Once | ORAL | Status: AC
  Administered 2017-10-23: 800 mg via ORAL
  Filled 2017-10-23: qty 1

## 2017-10-23 NOTE — Telephone Encounter (Signed)
Pt saw Dr Earlene PlaterWallace yesterday and called this morning to ask if it was normal to have headaches with the virus she has. She would like someone to contact her to discuss this.

## 2017-10-23 NOTE — ED Notes (Signed)
Pt IV was removed, with catheter intact. Site clean, dry.

## 2017-10-23 NOTE — Discharge Instructions (Addendum)
Your liver function tests are elevated. As we discussed this can be from multiple things such as viral infections to acute injury from recent tylenol use, uncooked foods, mono.   Your labs otherwise look ok. Your urine was contaminated, since you are not having urinary symptoms we will send it for culture to make sure there is no infection.   Follow up with your primary care doctor in 1 week for repeat liver function tests and follow up on the titers we obtained today.    Return to the ER for worsening pain, persistent vomiting fevers or chills  For pain you can take ibuprofen. Do not use acetaminophen (tylenol) or alcohol use.

## 2017-10-23 NOTE — ED Provider Notes (Signed)
[facility] DEPT Provider Note   CSN: [id number] Arrival date & time: 10/23/17  1440     History   Chief Complaint Chief Complaint  Patient presents with  . Abdominal Pain  . Fever  . Nausea  . Constipation    HPI Erin Wilkins is a 22 y.o. female with h/o UTI here for evaluation of abdominal pain x 2 days. Pain is to upper quadrants. Sudden, gradually worsening, moderate non radiating. Associated with nausea, decreased appetite, subjective fevers, chills, body aches, headaches. Went to PCP yesterday and was told it was a virus, woke up this morning and abdominal pain and fevers worse. Recently around a sick baby. No CP, cough, sore throat, rhinorrhea, vomiting, dysuria, hematuria, urgency, frequency. She has h/o constipation and usually has a BM once a week, last BM 1 week ago which is typical for her. Still passing gas. No abdominal surgeries. Has taken tylenol for current myalgias.   HPI  Past Medical History:  Diagnosis Date  . Anemia   . Pertussis 03/18/2012  . UTI (urinary tract infection)     Patient Active Problem List   Diagnosis Date Noted  . Urine frequency 09/06/2017  . STD exposure 09/06/2017  . Eczema 06/11/2017  . 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 Vaginal discharge 05/06/2015  . Constipation 05/06/2015  . Allergic rhinitis 01/09/2013    Past Surgical History:  Procedure Laterality Date  . NO PAST SURGERIES       OB History    Gravida  1   Para  1   Term  1   Preterm      AB      Living  1     SAB      TAB      Ectopic      Multiple  0   Live Births  1            Home Medications    Prior to Admission medications   Medication Sig Start Date End Date Taking? Authorizing Provider  etonogestrel (NEXPLANON) 68 MG IMPL implant 1 each by Subdermal route once.   Yes [provider]  fluticasone (FLONASE) 50 MCG/ACT nasal spray Place 2 sprays into both nostrils daily. Patient not taking: Reported on  10/23/2017 02/28/16   Mercy Riding, MD  ondansetron (ZOFRAN ODT) 4 MG disintegrating tablet Take 1 tablet (4 mg total) by mouth every 8 (eight) hours as needed for nausea or vomiting. 10/23/17   Kinnie Feil, PA-C  Skin Protectants, Misc. (EUCERIN) cream Apply topically as needed for dry skin. Patient not taking: Reported on 10/23/2017 06/11/17   Sherene Sires, DO    Family History Family History  Problem Relation Age of Onset  . Hypertension Mother     Social History Social History   Tobacco Use  . Smoking status: Never Smoker  . Smokeless tobacco: Never Used  Substance Use Topics  . Alcohol use: No  . Drug use: No     Allergies   Amoxicillin; Ancef [cefazolin]; and Penicillins   Review of Systems Review of Systems  Constitutional: Positive for appetite change, diaphoresis and fever.  Gastrointestinal: Positive for abdominal pain, constipation and nausea.  All other systems reviewed and are negative.    Physical Exam Updated Vital Signs BP 116/72 (BP Location: Right Arm)   Pulse 93   Temp 98.4 F (36.9 C) (Oral)   Resp 17   Ht _0  (1.549 m)   Wt 46.3 kg (102 lb)  SpO2 97%   BMI 19.27 kg/m   Physical Exam  Constitutional: She is oriented to person, place, and time. She appears well-developed and well-nourished. No distress.  Well appearing.  Eating Lays chips and soda.   HENT:  Head: Normocephalic and atraumatic.  Nose: Nose normal.  Moist mucous membranes   Eyes: Pupils are equal, round, and reactive to light. Conjunctivae and EOM are normal.  Neck: Normal range of motion.  Cardiovascular: Normal rate, regular rhythm and intact distal pulses.  2+ DP and radial pulses bilaterally. No LE edema.   Pulmonary/Chest: Effort normal and breath sounds normal.  Abdominal: Soft. Bowel sounds are normal. There is tenderness in the right upper quadrant and epigastric area. There is positive Murphy's sign.  Guarding with deep palpation of RUQ and epigastrium.  No  rigidity or rebound. No tenderness to lower quadrants. No suprapubic or CVA tenderness. Negative McBurney's.   Musculoskeletal: Normal range of motion.  Neurological: She is alert and oriented to person, place, and time.  Skin: Skin is warm and dry. Capillary refill takes less than 2 seconds.  Psychiatric: She has a normal mood and affect. Her behavior is normal.  Nursing note and vitals reviewed.    ED Treatments / Results  Labs (all labs ordered are listed, but only abnormal results are displayed) Labs Reviewed  COMPREHENSIVE METABOLIC PANEL - Abnormal; Notable for the following components:      Result Value   Potassium 3.2 (*)    Calcium 8.7 (*)    AST 496 (*)    ALT 532 (*)    Alkaline Phosphatase 156 (*)    All other components within normal limits  CBC - Abnormal; Notable for the following components:   WBC 3.4 (*)    All other components within normal limits  URINALYSIS, ROUTINE W REFLEX MICROSCOPIC - Abnormal; Notable for the following components:   Color, Urine AMBER (*)    APPearance HAZY (*)    Ketones, ur 20 (*)    Protein, ur 30 (*)    Leukocytes, UA MODERATE (*)    Bacteria, UA RARE (*)    All other components within normal limits  ACETAMINOPHEN LEVEL - Abnormal; Notable for the following components:   Acetaminophen (Tylenol), Serum <10 (*)    All other components within normal limits  URINE CULTURE  CULTURE, BLOOD (ROUTINE X 2)  CULTURE, BLOOD (ROUTINE X 2)  LIPASE, BLOOD  ETHANOL  MONONUCLEOSIS SCREEN  CK  HEPATITIS PANEL, ACUTE  EPSTEIN-BARR VIRUS VCA, IGG  EPSTEIN-BARR VIRUS VCA, IGM  I-STAT BETA HCG BLOOD, ED ([facility], [facility], [facility] ONLY)  I-STAT CG4 LACTIC ACID, ED    EKG None  Radiology US Abdomen Complete  Result Date: 10/23/2017 CLINICAL DATA:  Abdominal pain with elevated liver function tests EXAM: ABDOMEN ULTRASOUND COMPLETE COMPARISON:  None. FINDINGS: Gallbladder: Nonmobile nonshadowing 2 mm echogenic structure along the wall. No shadowing  gallstone or focal tenderness. Common bile duct: Diameter: 2 mm Liver: No focal lesion identified. Within normal limits in parenchymal echogenicity. Portal vein is patent on color Doppler imaging with normal direction of blood flow towards the liver. IVC: No abnormality visualized. Pancreas: Visualized portion unremarkable. Spleen: Size and appearance within normal limits. Right Kidney: Length: 10 cm. Echogenicity within normal limits. No mass or hydronephrosis visualized. Left Kidney: Length: 10 cm. Echogenicity within normal limits. No mass or hydronephrosis visualized. Abdominal aorta: No aneurysm visualized. IMPRESSION: 1. No acute finding.  Normal appearance of the liver. 2. 2 mm gallbladder polyp. Electronically Signed  By: Monte Fantasia M.D.   On: 10/23/2017 18:47    Procedures Procedures (including critical care time)  Medications Ordered in ED Medications  sodium chloride 0.9 % bolus 1,000 mL (0 mLs Intravenous Stopped 10/23/17 1909)  ibuprofen (ADVIL,MOTRIN) tablet 800 mg (800 mg Oral Given 10/23/17 1828)     Initial Impression / Assessment and Plan / ED Course  I have reviewed the triage vital signs and the nursing notes.  Pertinent labs & imaging results that were available during my care of the patient were reviewed by me and considered in my medical decision making (see chart for details).  Clinical Course as of Oct 23 2057  Wed Oct 23, 2017  1605 AST(!): 496 [CG]  1605 ALT(!): 532 [CG]  1605 Alkaline Phosphatase(!): 156 [CG]  1605 Potassium(!): 3.2 [CG]  1605 WBC(!): 3.4 [CG]  1605 Pulse Rate(!): 115 [CG]  1605 Temp: 100.3 F (37.9 C) [CG]  1621 Leukocytes, UA(!): MODERATE [CG]  1622 WBC, UA: 11-20 [CG]  1915 IMPRESSION: 1. No acute finding. Normal appearance of the liver. 2. 2 mm gallbladder polyp.  US Abdomen Complete [CG]  1936 Re-evaluated pt, repeated abd exam. She denies any pain.    [CG]    Clinical Course User Index [CG] Kinnie Feil, PA-C     ddx includes cholecystitis, pancreatitis, acute hepatitis, viral gastroenteritis.  Also considering mono, APAP liver injury, rhabdo given recent recent myalgias. Lower on differential is dissection or AAA, she is not hypertensive.   On exam she is tachycardic, febrile meeting SIRS criteria. Considering early or developing sepsis, however she is very non toxic appearing. Eating chips/soda in ER.  Her tachycardia may be secondary to fever.  +Murphy's. No peritonitis. No lower quadrant tenderness to raise suspicion for GU etiology. No suprapubic or CVA tenderness and UTI, kidney stone or pyelo less likely. Will obtain labs and reassess.   Final Clinical Impressions(s) / ED Diagnoses   Labs and ultrasound reviewed by me, remarkable for AST 496, ALT 532, alk phos 156 so.  WBC 3.4.  Moderate leukocytes however urine is with 11-20 squamous epithelium.  She is not having any urinary symptoms, suprapubic or CVA tenderness.  We will send for culture and defer antibiotics at this time.  I reevaluated patient and she feels much better, denies any abdominal tenderness.  Her vital signs have normalized.  Feel patient is adequate for discharge at this time with Zofran.  Will encourage she avoids hepatotoxic ingestions such as Tylenol, EtOH.  She is to follow-up with PCP within 1 week for repeat LFTs and follow-up on EBV and hepatitis panel.  Discussed plan with patient who is in agreement.   Old records, if available, reviewed by me. Imaging and labs viewed and interpreted by me and used in the medical decision making (formal interpretation from radiologist). Discharge home in stable condition, return precautions discussed. Patient, family agreeable with plan for discharge home.\ Final diagnoses:  Transaminitis  Elevated liver function tests    ED Discharge Orders        Ordered    ondansetron (ZOFRAN ODT) 4 MG disintegrating tablet  Every 8 hours PRN     10/23/17 2008       Arlean Hopping 10/23/17 2059    Drenda Freeze, MD 10/23/17 2239

## 2017-10-23 NOTE — ED Triage Notes (Signed)
Patient c/o abdominal pain, fever, and nausea x 2 days. Patient also reports no BM x 1 week. Patient reports that she saw her PCP yesterday and was being treated for a virus. Patient states the pain is worse today.

## 2017-10-24 ENCOUNTER — Telehealth: Payer: Self-pay | Admitting: Internal Medicine

## 2017-10-24 ENCOUNTER — Emergency Department (HOSPITAL_COMMUNITY)
Admission: EM | Admit: 2017-10-24 | Discharge: 2017-10-25 | Disposition: A | Discharge status: Home / Self Care | Payer: Medicaid Other | Attending: Emergency Medicine | Admitting: Emergency Medicine

## 2017-10-24 ENCOUNTER — Encounter (HOSPITAL_COMMUNITY): Payer: Self-pay

## 2017-10-24 DIAGNOSIS — L299 Pruritus, unspecified: Secondary | ICD-10-CM | POA: Insufficient documentation

## 2017-10-24 DIAGNOSIS — R21 Rash and other nonspecific skin eruption: Secondary | ICD-10-CM

## 2017-10-24 LAB — EPSTEIN-BARR VIRUS VCA, IGM: EBV VCA IgM: <36 U/mL (ref 0.0–35.9)

## 2017-10-24 LAB — HEPATITIS PANEL, ACUTE
HCV Ab: <0.1 s/co ratio (ref 0.0–0.9)
HEP B S AG: NEGATIVE
Hep A IgM: NEGATIVE
Hep B C IgM: NEGATIVE

## 2017-10-24 LAB — URINE CULTURE: Culture: NO GROWTH

## 2017-10-24 LAB — EPSTEIN-BARR VIRUS VCA, IGG: EBV VCA IGG: 98.3 U/mL — AB (ref 0.0–17.9)

## 2017-10-24 MED ORDER — DIPHENHYDRAMINE HCL 25 MG PO CAPS
25.0000 mg | ORAL_CAPSULE | Freq: Once | ORAL | Status: AC
  Administered 2017-10-25: 25 mg via ORAL
  Filled 2017-10-24: qty 1

## 2017-10-24 NOTE — Telephone Encounter (Signed)
Pt called and would like Dr Earlene PlaterWallace to call her ASAP. She was seen in the emergency room yesterday and was told some of her levels were elevated and she has some questions.

## 2017-10-24 NOTE — Telephone Encounter (Signed)
Patient saw Dr. Natale MilchLancaster. I will forward to her.   Erin Wilkins, D.O. 10/24/2017, 3:07 PM PGY-3, [facility]

## 2017-10-24 NOTE — ED Provider Notes (Signed)
[facility]-EMERGENCY DEPT Provider Note   CSN: [id number] Arrival date & time: 10/24/17  2202     History   Chief Complaint Chief Complaint  Patient presents with  . Rash    HPI Lorelee MarketJada Jacquot is a 22 y.o. female.  The history is provided by the patient and medical records.     22 year old female with history of anemia, presenting to the ED with rash.  Patient was seen in the ED for abdominal pain.  She was found to have elevated LFTs with suspected mononucleosis.  States today she started noticing a rash on her arms and legs that is very itchy.  She denies any changes in soaps, but thinks her mom may have changed their laundry detergent.  In the ED yesterday she was only given normal saline and Motrin, no other medications.  She denies any new foods.  She has not done anything for her rash.  She denies any difficulty swallowing, lip or tongue swelling.  Known allergies include penicillins and cephalosporins.  Past Medical History:  Diagnosis Date  . Anemia   . Pertussis 03/18/2012  . UTI (urinary tract infection)     Patient Active Problem List   Diagnosis Date Noted  . Urine frequency 09/06/2017  . STD exposure 09/06/2017  . Eczema 06/11/2017  . 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 Vaginal discharge 05/06/2015  . Constipation 05/06/2015  . Allergic rhinitis 01/09/2013    Past Surgical History:  Procedure Laterality Date  . NO PAST SURGERIES       OB History    Gravida  1   Para  1   Term  1   Preterm      AB      Living  1     SAB      TAB      Ectopic      Multiple  0   Live Births  1            Home Medications    Prior to Admission medications   Medication Sig Start Date End Date Taking? Authorizing Provider  etonogestrel (NEXPLANON) 68 MG IMPL implant 1 each by Subdermal route once.    [provider]  fluticasone (FLONASE) 50 MCG/ACT nasal spray Place 2 sprays into both nostrils daily. Patient not taking:  Reported on 10/23/2017 02/28/16   Almon HerculesGonfa, Taye T, MD  ondansetron (ZOFRAN ODT) 4 MG disintegrating tablet Take 1 tablet (4 mg total) by mouth every 8 (eight) hours as needed for nausea or vomiting. 10/23/17   Liberty HandyGibbons, Claudia J, PA-C  Skin Protectants, Misc. (EUCERIN) cream Apply topically as needed for dry skin. Patient not taking: Reported on 10/23/2017 06/11/17   Marthenia RollingBland, Scott, DO    Family History Family History  Problem Relation Age of Onset  . Hypertension Mother     Social History Social History   Tobacco Use  . Smoking status: Never Smoker  . Smokeless tobacco: Never Used  Substance Use Topics  . Alcohol use: No  . Drug use: No     Allergies   Amoxicillin; Ancef [cefazolin]; and Penicillins   Review of Systems Review of Systems  Skin: Positive for rash.  All other systems reviewed and are negative.    Physical Exam Updated Vital Signs BP 115/78 (BP Location: Left Arm)   Pulse 94   Temp 98.8 F (37.1 C) (Oral)   Resp 16   Ht 5\' 1"  (1.549 m)   Wt 46.3 kg (102 lb)  SpO2 100%   BMI 19.27 kg/m   Physical Exam  Constitutional: She is oriented to person, place, and time. She appears well-developed and well-nourished.  HENT:  Head: Normocephalic and atraumatic.  Mouth/Throat: Oropharynx is clear and moist.  No oral lesions, no lip or tongue swelling, handling secretions well, normal phonation without stridor  Eyes: Pupils are equal, round, and reactive to light. Conjunctivae and EOM are normal.  Neck: Normal range of motion.  Cardiovascular: Normal rate, regular rhythm and normal heart sounds.  Pulmonary/Chest: Effort normal and breath sounds normal.  Abdominal: Soft. Bowel sounds are normal.  Musculoskeletal: Normal range of motion.  IV site in left [facility] from yesterday appears clean without signs of infection  Neurological: She is alert and oriented to person, place, and time.  Skin: Skin is warm and dry. Rash noted.  Very fine maculopapular rash throughout  arms and legs, no signs of superimposed infection or cellulitis, no lesions on the palms or soles  Psychiatric: She has a normal mood and affect.  Nursing note and vitals reviewed.    ED Treatments / Results  Labs (all labs ordered are listed, but only abnormal results are displayed) Labs Reviewed - No data to display  EKG None  Radiology US Abdomen Complete  Result Date: 10/23/2017 CLINICAL DATA:  Abdominal pain with elevated liver function tests EXAM: ABDOMEN ULTRASOUND COMPLETE COMPARISON:  None. FINDINGS: Gallbladder: Nonmobile nonshadowing 2 mm echogenic structure along the wall. No shadowing gallstone or focal tenderness. Common bile duct: Diameter: 2 mm Liver: No focal lesion identified. Within normal limits in parenchymal echogenicity. Portal vein is patent on color Doppler imaging with normal direction of blood flow towards the liver. IVC: No abnormality visualized. Pancreas: Visualized portion unremarkable. Spleen: Size and appearance within normal limits. Right Kidney: Length: 10 cm. Echogenicity within normal limits. No mass or hydronephrosis visualized. Left Kidney: Length: 10 cm. Echogenicity within normal limits. No mass or hydronephrosis visualized. Abdominal aorta: No aneurysm visualized. IMPRESSION: 1. No acute finding.  Normal appearance of the liver. 2. 2 mm gallbladder polyp. Electronically Signed   By: Marnee Spring M.D.   On: 10/23/2017 18:47    Procedures Procedures (including critical care time)  Medications Ordered in ED Medications - No data to display   Initial Impression / Assessment and Plan / ED Course  I have reviewed the triage vital signs and the nursing notes.  Pertinent labs & imaging results that were available during my care of the patient were reviewed by me and considered in my medical decision making (see chart for details).  22 year old female here with rash.  Seen in the ED yesterday due to abdominal pain, found to have transaminitis with  suspected mononucleosis.  Presents today with rash on her bilateral arms and legs.  She is afebrile and nontoxic.  Rashes very fine, maculopapular and appears most consistent with a contact dermatitis.  Does report that her mother may have changed her laundry detergent.  IV site from yesterday in the left antecubital fossa appears clean without any signs of infection or thrombophlebitis.  She has no airway compromise or other signs or symptoms concerning for angioedema.  No lesions on the palms or soles to suspect syphilis.  Will treat with Benadryl.  Of note, her Epstein-Barr titers from yesterday are significantly elevated, likely mononucleosis.  In light of this, we will have her continue to avoid Tylenol and alcohol until her LFTs returned back to normal.  She understands to follow-up closely with her primary care  doctor.  Final Clinical Impressions(s) / ED Diagnoses   Final diagnoses:  Rash    ED Discharge Orders    None       Garlon Hatchet, PA-C 10/25/17 0032    Melene Plan, DO 10/25/17 [phone]

## 2017-10-24 NOTE — ED Triage Notes (Signed)
Pt complains of a generalized rash since this morning, pt says that she was seen and treated here yesterday for abdominal pain and was given medicine IV

## 2017-10-25 ENCOUNTER — Other Ambulatory Visit: Payer: Self-pay

## 2017-10-25 NOTE — Telephone Encounter (Signed)
Returned patient's call. Discussed Epstein-Barr virus titers and suspicion of mononucleosis. Discussed sx of mono and that it will resolve on its own with time. Also discussed medications to avoid, especially Tylenol given elevated LFTs. Patient voiced appreciation and understanding.   Tarri AbernethyAbigail J Mersadez Linden, MD, MPH PGY-3 [facility] Pager [phone]

## 2017-10-25 NOTE — Discharge Instructions (Signed)
Can continue benadryl at home.  Make sure to use gentle soaps and detergents. Continue to avoid tylenol and alcohol until your liver function tests go back to normal. Follow-up with your primary care doctor. Return here for any new/acute changes.

## 2017-10-28 LAB — CULTURE, BLOOD (ROUTINE X 2)
Culture: NO GROWTH
Culture: NO GROWTH
SPECIAL REQUESTS: ADEQUATE
Special Requests: ADEQUATE

## 2017-11-04 NOTE — Progress Notes (Signed)
Subjective:   MRN [id number]  Date of birth: 1995/07/05   AUB:  Erin Wilkins is a 22 year old female who presents today with abnormal uterine bleeding.  She reports this menstrual cycle starting 2 weeks ago.  The flow has been heavy and blood is described as darker reddish-brown and red.  She has had to change her pad every 2-3 hours.  She reports being annoyed with the bleeding and complains of irritation caused by the pads over the last 2 weeks.  Her.  Is usually regular and last 5 to 7 days.  The last time her period was abnormal was when she had the neck Nexplanon placed in January 2017.  Erin Wilkins denies any headache, dizziness, changes in vision, syncope, shortness of breath, increased heart rate, fatigue.  Hospital follow-up for transaminitis: She is also here today for a follow-up to check LFTs after they were found to be elevated on 10/23/2017.  She was diagnosed with mono during that visit, and also reports heavy over-the-counter drug use for headache, stomachache, and other nonspecific symptoms.  Of note, an abdominal ultrasound study showed "nonmobile nonshadowing 2 mm echogenic structure along the wall.  No shadowing gallstones or focal tenderness."  Patient does report history of epigastric and periumbilical abdominal pain that comes and goes.  She has not experienced any of this in the last few weeks. Patient denies any abdominal pain, fatigue, headache, yellowing of her skin, aggressive physical activity.  She reports feeling well and feels that she has recovered from the mono.  STD exposure: Erin Wilkins would like chlamydia testing as she is currently sexually active.  She was last treated for chlamydia with azithromycin on 10/14/2017.  Medications:  Reviewed and updated.  Nexplanon  Allergies:   Reviewed and updated.  Pertinent Past Medical History:  STD exposure Chlamydia Mono transaminitis 2/2 unknown cause  Review of Systems:  Otherwise negative except for those stated in HPI.       Objective:  Physical Exam:  BP 100/66   Pulse 71   Temp 98.5 F (36.9 C) (Oral)   Ht '5\' 1"'  (1.549 m)   Wt 103 lb 3.2 oz (46.8 kg)   SpO2 93% Comment: finger nail polish  BMI 19.50 kg/m   Gen: NAD, alert, non-toxic, well-nourished, well-appearing HEENT: Normocephaic, atraumatic. Hearing intact.  Abd: Nondistended.  Skin: No obvious rashes, lesions, or trauma.  Normal turgor.  No rashes, normal turgor  Neuro: Cranial nerves II through VI grossly intact.  Normal strength and normal tone.  Gait normal.  Psych: Good insight, cooperative with exam.  Normal judgment. External Vaginal exam: There is no obvious rash or lesion upon inspection of the vulva and labia.  No signs of trauma.  Abnormal discharge not appreciated.  There is a small amount of blood  pooled in the introitus.   Pertinent Labs & Imaging:  10/23/2017:  EBV VCA IgG was noted to be at 98.3.  Reference units 0 to 17.9 units/mL. Negative acetaminophen level Hepatitis C antibody negative  H&H: 11.9/35.4  AST: 496 ALT: 532 Alk phos: 156  Ultrasound results as stated in HPI    Assessment & Plan:  Abnormal uterine bleeding (AUB) Patient complains of prolonged menstrual cycle x2 weeks.  She reports having a regular cycle over the last year and had a Nexplanon placed 2-1/2 years ago. Differential includes ovarian dysfunction, incomplete abortion, or stress from recent illness.  Not likely due to Nexplanon.  As this is not a chronic issue and patient is not experiencing any  symptoms of acute blood loss, we are holding off on AUB work-up.  Ordering CBC as well as urine pregnancy.  I will call or reach out via MyChart with your results.  Discussed return precautions for bleeding.  For symptom management of irritation, suggested over-the-counter creams to help with chafing.  Other lab orders today include urine gonorrhea chlamydia upon patient request and repeat liver panel for prior transaminitis during last ED  visit.  Erin Wilkins, M.D. 11/04/2017, 9:31 PM PGY-1, [place]

## 2017-11-05 ENCOUNTER — Other Ambulatory Visit (HOSPITAL_COMMUNITY)
Admission: RE | Admit: 2017-11-05 | Discharge: 2017-11-05 | Disposition: A | Discharge status: Home / Self Care | Payer: Medicaid Other | Source: Ambulatory Visit | Attending: Family Medicine | Admitting: Family Medicine

## 2017-11-05 ENCOUNTER — Other Ambulatory Visit: Payer: Self-pay

## 2017-11-05 ENCOUNTER — Encounter: Payer: Self-pay | Admitting: Family Medicine

## 2017-11-05 ENCOUNTER — Ambulatory Visit (INDEPENDENT_AMBULATORY_CARE_PROVIDER_SITE_OTHER): Payer: Self-pay | Admitting: Family Medicine

## 2017-11-05 VITALS — BP 100/66 | HR 71 | Temp 98.5°F | Ht 61.0 in | Wt 103.2 lb

## 2017-11-05 DIAGNOSIS — Z202 Contact with and (suspected) exposure to infections with a predominantly sexual mode of transmission: Secondary | ICD-10-CM | POA: Insufficient documentation

## 2017-11-05 DIAGNOSIS — Z3202 Encounter for pregnancy test, result negative: Secondary | ICD-10-CM

## 2017-11-05 DIAGNOSIS — R740 Nonspecific elevation of levels of transaminase and lactic acid dehydrogenase [LDH]: Secondary | ICD-10-CM

## 2017-11-05 DIAGNOSIS — N939 Abnormal uterine and vaginal bleeding, unspecified: Secondary | ICD-10-CM

## 2017-11-05 DIAGNOSIS — R7401 Elevation of levels of liver transaminase levels: Secondary | ICD-10-CM

## 2017-11-05 LAB — POCT URINE PREGNANCY: PREG TEST UR: NEGATIVE

## 2017-11-05 NOTE — Assessment & Plan Note (Addendum)
Patient complains of prolonged menstrual cycle x2 weeks.  She reports having a regular cycle over the last year and had a Nexplanon placed 2-1/2 years ago. Differential includes ovarian dysfunction, incomplete abortion, or stress from recent illness.  Not likely due to Nexplanon.  As this is not a chronic issue and patient is not experiencing any symptoms of acute blood loss, we are holding off on AUB work-up.  Ordering CBC as well as urine pregnancy.  I will call or reach out via MyChart with your results.  Discussed return precautions for bleeding.  For symptom management of irritation, suggested over-the-counter creams to help with chafing.

## 2017-11-05 NOTE — Patient Instructions (Signed)
It was a pleasure to see you today! Thank you for choosing [facility] for your primary care. [name] was seen for abnormal uterine bleeding, follow-up lab testing for ED visit, screening.   Our plans for today were:  Today we discussed possible causes of your abnormal uterine bleeding.  You have had a lot of different health issues in the last few months, and this can cause abnormal menstrual cycles.  Because this is a first occurrence, I would like to be conservative and work-up.  However, I have ordered a blood test for you to make sure that you are not bleeding too much.  I will call you with the results.   We are also doing urine testing today I will follow-up with you with the results.  I am also ordering liver tests as a follow-up to your ED visit.  For the vaginal irritation, try to allow as much time as possible without pads.  You can also try an over-the-counter cream.  It is called Monistat care chafing relief gel powder.  You can find it at [facility] for about $8.  Please follow-up if bleeding continues, if you start to feel dizzy or lightheaded, or with any other concerning symptoms.    Best,   Dr. Genia Hotterachel Chenise Mulvihill

## 2017-11-06 LAB — HEPATIC FUNCTION PANEL
ALBUMIN: 4.6 g/dL (ref 3.5–5.5)
ALT: 28 IU/L (ref 0–32)
AST: 18 IU/L (ref 0–40)
Alkaline Phosphatase: 83 IU/L (ref 39–117)
BILIRUBIN TOTAL: 0.4 mg/dL (ref 0.0–1.2)
Bilirubin, Direct: 0.11 mg/dL (ref 0.00–0.40)
TOTAL PROTEIN: 7.4 g/dL (ref 6.0–8.5)

## 2017-11-06 LAB — URINE CYTOLOGY ANCILLARY ONLY
Chlamydia: NEGATIVE
NEISSERIA GONORRHEA: NEGATIVE
Trichomonas: NEGATIVE

## 2017-11-06 LAB — CBC WITH DIFFERENTIAL
Basophils Absolute: 0 10*3/uL (ref 0.0–0.2)
Basos: 1 %
EOS (ABSOLUTE): 0.1 10*3/uL (ref 0.0–0.4)
EOS: 1 %
HEMATOCRIT: 37.7 % (ref 34.0–46.6)
HEMOGLOBIN: 12.4 g/dL (ref 11.1–15.9)
Immature Grans (Abs): 0 10*3/uL (ref 0.0–0.1)
Immature Granulocytes: 0 %
LYMPHS ABS: 2.4 10*3/uL (ref 0.7–3.1)
Lymphs: 42 %
MCH: 30.2 pg (ref 26.6–33.0)
MCHC: 32.9 g/dL (ref 31.5–35.7)
MCV: 92 fL (ref 79–97)
Monocytes Absolute: 0.5 10*3/uL (ref 0.1–0.9)
Monocytes: 9 %
NEUTROS ABS: 2.8 10*3/uL (ref 1.4–7.0)
Neutrophils: 47 %
RBC: 4.1 x10E6/uL (ref 3.77–5.28)
RDW: 13.9 % (ref 12.3–15.4)
WBC: 5.8 10*3/uL (ref 3.4–10.8)

## 2017-11-09 ENCOUNTER — Encounter: Payer: Self-pay | Admitting: Family Medicine

## 2017-11-10 ENCOUNTER — Encounter (HOSPITAL_COMMUNITY): Payer: Self-pay | Admitting: Emergency Medicine

## 2017-11-10 ENCOUNTER — Emergency Department (HOSPITAL_COMMUNITY)
Admission: EM | Admit: 2017-11-10 | Discharge: 2017-11-10 | Disposition: A | Discharge status: Home / Self Care | Payer: Medicaid Other | Attending: Emergency Medicine | Admitting: Emergency Medicine

## 2017-11-10 DIAGNOSIS — N938 Other specified abnormal uterine and vaginal bleeding: Secondary | ICD-10-CM | POA: Insufficient documentation

## 2017-11-10 DIAGNOSIS — Z5321 Procedure and treatment not carried out due to patient leaving prior to being seen by health care provider: Secondary | ICD-10-CM | POA: Insufficient documentation

## 2017-11-10 NOTE — ED Triage Notes (Signed)
Pt reports that she been having vaginal bleeding for over 3 weeks. Reports went to doctor last week and had blood work and urine down but hasnt gotten results back yet. Today report filled pad in about 20 minutes with darker blood and small clots. Denies pain.

## 2017-11-10 NOTE — ED Notes (Signed)
No answer from lobby  

## 2017-11-18 NOTE — Progress Notes (Signed)
Subjective:   Patient ID: MRN [id number]  Date of birth: 03/09/1996  Erin Wilkins is a 22 y.o. female with no past medical history, presents to clinic for annual physical exam and paperwork for her work.  Patient reports doing well since her last visit on 2 July.  She reports that her bleeding stopped.  She has no complaints today.  Review of Symptoms:  14 Systems negative except for those mentioned in HPI.   Medications/Allergies:  Medications Reviewed in Chart Allergies Reviewed in Chart  Pertinent Social History:  Pt is starting work at a childcare center. She has been there for 2 weeks and reports that it is going well.    reports that she has never smoked. She has never used smokeless tobacco. She reports that she does not drink alcohol or use drugs.  Objective:  BP 98/60   Pulse 77   Temp 98.4 F (36.9 C) (Oral)   Ht 5\' 1"  (1.549 m)   Wt 105 lb 12.8 oz (48 kg)   SpO2 99%   BMI 19.99 kg/m   Physical Exam:  Gen: NAD, alert, non-toxic, well-nourished, well-appearing, pleasant HEENT: Normocephaic, atraumatic. PERRLA, clear conjuctiva, no scleral icterus and injection. Normal EOM.  Hearing intact. TM pearly grey bilaterally with no fluid.  Neck supple with no LAD, nodules, or gross abnormality.  Nares patent with no discharge.  Maxillary and frontal sinuses nontender to palpation.  Oropharynx without erythema and lesions.  Tonsils nonswollen and without exudate.   CV: Regular rate and rhythm.  Normal S1-S2.  No murmur, gallops, S3, S4 appreciated.  Normal capillary refill bilaterally.  Radial pulses 2+ bilaterally. No bilateral lower extremity edema. Resp: Clear to auscultation bilaterally.  No wheezing, rales, abnormal lung sounds.  No increased work of breathing appreciated. Abd: Nontender and nondistended on palpation to all 4 quadrants.  Positive bowel sounds. Skin: No obvious rashes, lesions, or trauma.  Normal turgor.  MSK: Normal ROM. Normal strength and tone.  Neuro:  Cranial nerves II through VI grossly intact. Gait normal.  Alert and oriented x4.  No obvious abnormal movements. Psych: Good insight, cooperative with exam.  Normal judgment.  Pertinent Labs & Imaging:  Pertinent Labs Reviewed in Chart  Assessment & Plan:  There are no diagnoses linked to this encounter. Routine general medical examination at a health care facility Patient is doing well today.  She has no complaints.  She is all up-to-date with health maintenance.  Genia Hotter, M.D. 11/19/2017, 9:23 AM PGY-1, [facility]

## 2017-11-19 ENCOUNTER — Encounter: Payer: Self-pay | Admitting: Family Medicine

## 2017-11-19 ENCOUNTER — Other Ambulatory Visit: Payer: Self-pay

## 2017-11-19 ENCOUNTER — Ambulatory Visit (INDEPENDENT_AMBULATORY_CARE_PROVIDER_SITE_OTHER): Payer: Self-pay | Admitting: Family Medicine

## 2017-11-19 VITALS — BP 98/60 | HR 77 | Temp 98.4°F | Ht 61.0 in | Wt 105.8 lb

## 2017-11-19 DIAGNOSIS — Z0000 Encounter for general adult medical examination without abnormal findings: Secondary | ICD-10-CM

## 2017-11-19 DIAGNOSIS — N939 Abnormal uterine and vaginal bleeding, unspecified: Secondary | ICD-10-CM

## 2017-11-19 NOTE — Patient Instructions (Addendum)
Dear Erin Wilkins,   It was nice to see you today! I am glad you came in for your concerns. This document serves as a "wrap-up" to all that we discussed today and is listed as follows:    Your physical exam was all within normal limits. No lab work was done today.   No follow up is necessary. I am glad you are doing well!  You are completely up to date on your screening tests! Great work!   Thank you for choosing [facility] for your primary care needs and stay well!   Best,   Dr. Zettie Cooley Resident Physician [facility] [phone]    Health Maintenance, Female Adopting a healthy lifestyle and getting preventive care can go a long way to promote health and wellness. Talk with your health care provider about what schedule of regular examinations is right for you. This is a good chance for you to check in with your provider about disease prevention and staying healthy. In between checkups, there are plenty of things you can do on your own. Experts have done a lot of research about which lifestyle changes and preventive measures are most likely to keep you healthy. Ask your health care provider for more information. Weight and diet Eat a healthy diet  Be sure to include plenty of vegetables, fruits, low-fat dairy products, and lean protein.  Do not eat a lot of foods high in solid fats, added sugars, or salt.  Get regular exercise. This is one of the most important things you can do for your health. ? Most adults should exercise for at least 150 minutes each week. The exercise should increase your heart rate and make you sweat (moderate-intensity exercise). ? Most adults should also do strengthening exercises at least twice a week. This is in addition to the moderate-intensity exercise.  Maintain a healthy weight  Body mass index (BMI) is a measurement that can be used to identify possible weight problems. It estimates body fat based on height and weight.  Your health care provider can help determine your BMI and help you achieve or maintain a healthy weight.  For females 59 years of age and older: ? A BMI below 18.5 is considered underweight. ? A BMI of 18.5 to 24.9 is normal. ? A BMI of 25 to 29.9 is considered overweight. ? A BMI of 30 and above is considered obese.  Watch levels of cholesterol and blood lipids  You should start having your blood tested for lipids and cholesterol at 22 years of age, then have this test every 5 years.  You may need to have your cholesterol levels checked more often if: ? Your lipid or cholesterol levels are high. ? You are older than 22 years of age. ? You are at high risk for heart disease.  Cancer screening Lung Cancer  Lung cancer screening is recommended for adults 4-36 years old who are at high risk for lung cancer because of a history of smoking.  A yearly low-dose CT scan of the lungs is recommended for people who: ? Currently smoke. ? Have quit within the past 15 years. ? Have at least a 30-pack-year history of smoking. A pack year is smoking an average of one pack of cigarettes a day for 1 year.  Yearly screening should continue until it has been 15 years since you quit.  Yearly screening should stop if you develop a health problem that would prevent you from having lung cancer treatment.  Breast Cancer  Practice breast self-awareness. This means understanding how your breasts normally appear and feel.  It also means doing regular breast self-exams. Let your health care provider know about any changes, no matter how small.  If you are in your 20s or 30s, you should have a clinical breast exam (CBE) by a health care provider every 1-3 years as part of a regular health exam.  If you are 5 or older, have a CBE every year. Also consider having a breast X-ray (mammogram) every year.  If you have a family history of breast cancer, talk to your health care provider about genetic  screening.  If you are at high risk for breast cancer, talk to your health care provider about having an MRI and a mammogram every year.  Breast cancer gene (BRCA) assessment is recommended for women who have family members with BRCA-related cancers. BRCA-related cancers include: ? Breast. ? Ovarian. ? Tubal. ? Peritoneal cancers.  Results of the assessment will determine the need for genetic counseling and BRCA1 and BRCA2 testing.  Cervical Cancer Your health care provider may recommend that you be screened regularly for cancer of the pelvic organs (ovaries, uterus, and vagina). This screening involves a pelvic examination, including checking for microscopic changes to the surface of your cervix (Pap test). You may be encouraged to have this screening done every 3 years, beginning at age 65.  For women ages 36-65, health care providers may recommend pelvic exams and Pap testing every 3 years, or they may recommend the Pap and pelvic exam, combined with testing for human papilloma virus (HPV), every 5 years. Some types of HPV increase your risk of cervical cancer. Testing for HPV may also be done on women of any age with unclear Pap test results.  Other health care providers may not recommend any screening for nonpregnant women who are considered low risk for pelvic cancer and who do not have symptoms. Ask your health care provider if a screening pelvic exam is right for you.  If you have had past treatment for cervical cancer or a condition that could lead to cancer, you need Pap tests and screening for cancer for at least 20 years after your treatment. If Pap tests have been discontinued, your risk factors (such as having a new sexual partner) need to be reassessed to determine if screening should resume. Some women have medical problems that increase the chance of getting cervical cancer. In these cases, your health care provider may recommend more frequent screening and Pap  tests.  Colorectal Cancer  This type of cancer can be detected and often prevented.  Routine colorectal cancer screening usually begins at 22 years of age and continues through 22 years of age.  Your health care provider may recommend screening at an earlier age if you have risk factors for colon cancer.  Your health care provider may also recommend using home test kits to check for hidden blood in the stool.  A small camera at the end of a tube can be used to examine your colon directly (sigmoidoscopy or colonoscopy). This is done to check for the earliest forms of colorectal cancer.  Routine screening usually begins at age 72.  Direct examination of the colon should be repeated every 5-10 years through 22 years of age. However, you may need to be screened more often if early forms of precancerous polyps or small growths are found.  Skin Cancer  Check your skin from head to toe regularly.  Tell  your health care provider about any new moles or changes in moles, especially if there is a change in a mole's shape or color.  Also tell your health care provider if you have a mole that is larger than the size of a pencil eraser.  Always use sunscreen. Apply sunscreen liberally and repeatedly throughout the day.  Protect yourself by wearing long sleeves, pants, a wide-brimmed hat, and sunglasses whenever you are outside.  Heart disease, diabetes, and high blood pressure  High blood pressure causes heart disease and increases the risk of stroke. High blood pressure is more likely to develop in: ? People who have blood pressure in the high end of the normal range (130-139/85-89 mm Hg). ? People who are overweight or obese. ? People who are African American.  If you are 24-56 years of age, have your blood pressure checked every 3-5 years. If you are 17 years of age or older, have your blood pressure checked every year. You should have your blood pressure measured twice-once when you are at  a hospital or clinic, and once when you are not at a hospital or clinic. Record the average of the two measurements. To check your blood pressure when you are not at a hospital or clinic, you can use: ? An automated blood pressure machine at a pharmacy. ? A home blood pressure monitor.  If you are between 18 years and 64 years old, ask your health care provider if you should take aspirin to prevent strokes.  Have regular diabetes screenings. This involves taking a blood sample to check your fasting blood sugar level. ? If you are at a normal weight and have a low risk for diabetes, have this test once every three years after 22 years of age. ? If you are overweight and have a high risk for diabetes, consider being tested at a younger age or more often. Preventing infection Hepatitis B  If you have a higher risk for hepatitis B, you should be screened for this virus. You are considered at high risk for hepatitis B if: ? You were born in a country where hepatitis B is common. Ask your health care provider which countries are considered high risk. ? Your parents were born in a high-risk country, and you have not been immunized against hepatitis B (hepatitis B vaccine). ? You have HIV or AIDS. ? You use needles to inject street drugs. ? You live with someone who has hepatitis B. ? You have had sex with someone who has hepatitis B. ? You get hemodialysis treatment. ? You take certain medicines for conditions, including cancer, organ transplantation, and autoimmune conditions.  Hepatitis C  Blood testing is recommended for: ? Everyone born from [birth year] through 1965. ? Anyone with known risk factors for hepatitis C.  Sexually transmitted infections (STIs)  You should be screened for sexually transmitted infections (STIs) including gonorrhea and chlamydia if: ? You are sexually active and are younger than 22 years of age. ? You are older than 22 years of age and your health care provider tells  you that you are at risk for this type of infection. ? Your sexual activity has changed since you were last screened and you are at an increased risk for chlamydia or gonorrhea. Ask your health care provider if you are at risk.  If you do not have HIV, but are at risk, it may be recommended that you take a prescription medicine daily to prevent HIV infection. This is called pre-exposure  prophylaxis (PrEP). You are considered at risk if: ? You are sexually active and do not regularly use condoms or know the HIV status of your partner(s). ? You take drugs by injection. ? You are sexually active with a partner who has HIV.  Talk with your health care provider about whether you are at high risk of being infected with HIV. If you choose to begin PrEP, you should first be tested for HIV. You should then be tested every 3 months for as long as you are taking PrEP. Pregnancy  If you are premenopausal and you may become pregnant, ask your health care provider about preconception counseling.  If you may become pregnant, take 400 to 800 micrograms (mcg) of folic acid every day.  If you want to prevent pregnancy, talk to your health care provider about birth control (contraception). Osteoporosis and menopause  Osteoporosis is a disease in which the bones lose minerals and strength with aging. This can result in serious bone fractures. Your risk for osteoporosis can be identified using a bone density scan.  If you are 67 years of age or older, or if you are at risk for osteoporosis and fractures, ask your health care provider if you should be screened.  Ask your health care provider whether you should take a calcium or vitamin D supplement to lower your risk for osteoporosis.  Menopause may have certain physical symptoms and risks.  Hormone replacement therapy may reduce some of these symptoms and risks. Talk to your health care provider about whether hormone replacement therapy is right for  you. Follow these instructions at home:  Schedule regular health, dental, and eye exams.  Stay current with your immunizations.  Do not use any tobacco products including cigarettes, chewing tobacco, or electronic cigarettes.  If you are pregnant, do not drink alcohol.  If you are breastfeeding, limit how much and how often you drink alcohol.  Limit alcohol intake to no more than 1 drink per day for nonpregnant women. One drink equals 12 ounces of beer, 5 ounces of wine, or 1 ounces of hard liquor.  Do not use street drugs.  Do not share needles.  Ask your health care provider for help if you need support or information about quitting drugs.  Tell your health care provider if you often feel depressed.  Tell your health care provider if you have ever been abused or do not feel safe at home. This information is not intended to replace advice given to you by your health care provider. Make sure you discuss any questions you have with your health care provider. Document Released: 11/06/2010 Document Revised: 09/29/2015 Document Reviewed: 01/25/2015 Elsevier Interactive Patient Education  [employer].

## 2017-11-19 NOTE — Assessment & Plan Note (Signed)
Patient is doing well today.  She has no complaints.  She is all up-to-date with health maintenance.

## 2017-12-02 ENCOUNTER — Other Ambulatory Visit: Payer: Self-pay

## 2017-12-02 ENCOUNTER — Encounter: Payer: Self-pay | Admitting: Family Medicine

## 2017-12-02 ENCOUNTER — Ambulatory Visit (INDEPENDENT_AMBULATORY_CARE_PROVIDER_SITE_OTHER): Payer: Self-pay | Admitting: Family Medicine

## 2017-12-02 ENCOUNTER — Other Ambulatory Visit (HOSPITAL_COMMUNITY)
Admission: RE | Admit: 2017-12-02 | Discharge: 2017-12-02 | Disposition: A | Discharge status: Home / Self Care | Payer: Medicaid Other | Source: Ambulatory Visit | Attending: Family Medicine | Admitting: Family Medicine

## 2017-12-02 VITALS — BP 95/60 | HR 66 | Temp 98.5°F | Wt 103.0 lb

## 2017-12-02 DIAGNOSIS — B3731 Acute candidiasis of vulva and vagina: Secondary | ICD-10-CM

## 2017-12-02 DIAGNOSIS — B373 Candidiasis of vulva and vagina: Secondary | ICD-10-CM | POA: Diagnosis not present

## 2017-12-02 LAB — POCT WET PREP (WET MOUNT)
CLUE CELLS WET PREP WHIFF POC: POSITIVE
Trichomonas Wet Prep HPF POC: ABSENT

## 2017-12-02 MED ORDER — FLUCONAZOLE 150 MG PO TABS: 150.0000 mg | ORAL_TABLET | Freq: Once | ORAL | 0 refills | Status: AC

## 2017-12-02 NOTE — Progress Notes (Signed)
    Subjective:  Erin Wilkins is a 22 y.o. female who presents to the [facility] today with a chief complaint of vaginal irritation.   HPI:   Been having lots of vaginal discharge and irritation, no odor. Very itchy and burning sensation. Feels different than BV she had before. No vaginal bleeding.  Has been using a new fragrant soap.  No recent STD exposures. DId have full STD testing recently.    ROS: Per HPI   Objective:  Physical Exam: BP 95/60   Pulse 66   Temp 98.5 F (36.9 C) (Oral)   Wt 103 lb (46.7 kg)   BMI 19.46 kg/m   Gen: NAD, resting comfortably Pelvic exam: normal external genitalia and vulva. Vagina with normal rugae but notable for whitish clumpy vaginal discharge. Normal cervix, no CMT Skin: warm, dry Neuro: grossly normal, moves all extremities Psych: Normal affect and thought content  Results for orders placed or performed in visit on 12/02/17 (from the past 72 hour(s))  POCT Wet Prep [facility]([place] [place])     Status: Abnormal   Collection Time: 12/02/17  2:04 PM  Result Value Ref Range   Source Wet Prep POC VAG    WBC, Wet Prep HPF POC >20    Bacteria Wet Prep HPF POC Many (A) Few   Clue Cells Wet Prep HPF POC Moderate (A) None   Clue Cells Wet Prep Whiff POC Positive Whiff    Yeast Wet Prep HPF POC Moderate (A) None   KOH Wet Prep POC Moderate (A) None   Trichomonas Wet Prep HPF POC Absent Absent     Assessment/Plan:  1. Vaginal yeast infection Although wet prep is showing both yeast and BV, clinical picture of intense vaginal itching with whitish clumpy vaginal discharge without odor is more consistent with vaginal yeast infection. Unlikely to have concomittant BV. Discussed with patient that if no improvement after diflucan then she should call back or come back for recheck.  - POCT Wet Prep [facility]([place]) - Cervicovaginal ancillary only - fluconazole (DIFLUCAN) 150 MG tablet; Take 1 tablet (150 mg total) by mouth once for 1 dose.  Dispense: 1 tablet; Refill:  0   Leland HerElsia J Kasim Mccorkle, DO PGY-3, [facility] 12/02/2017 2:07 PM

## 2017-12-02 NOTE — Patient Instructions (Signed)
Take diflucan.  If your symptoms are not better in 1 week then call us or come back for a recheck.

## 2017-12-04 ENCOUNTER — Encounter: Payer: Self-pay | Admitting: Family Medicine

## 2017-12-04 LAB — CERVICOVAGINAL ANCILLARY ONLY
Chlamydia: NEGATIVE
Neisseria Gonorrhea: NEGATIVE

## 2018-01-30 ENCOUNTER — Other Ambulatory Visit: Payer: Self-pay

## 2018-01-30 ENCOUNTER — Other Ambulatory Visit (HOSPITAL_COMMUNITY)
Admission: RE | Admit: 2018-01-30 | Discharge: 2018-01-30 | Disposition: A | Discharge status: Home / Self Care | Payer: Medicaid Other | Source: Ambulatory Visit | Attending: Family Medicine | Admitting: Family Medicine

## 2018-01-30 ENCOUNTER — Encounter: Payer: Self-pay | Admitting: Student in an Organized Health Care Education/Training Program

## 2018-01-30 ENCOUNTER — Ambulatory Visit (INDEPENDENT_AMBULATORY_CARE_PROVIDER_SITE_OTHER): Payer: Self-pay | Admitting: Student in an Organized Health Care Education/Training Program

## 2018-01-30 VITALS — BP 102/78 | Temp 99.5°F | Ht 61.0 in | Wt 108.6 lb

## 2018-01-30 DIAGNOSIS — B379 Candidiasis, unspecified: Secondary | ICD-10-CM

## 2018-01-30 DIAGNOSIS — N760 Acute vaginitis: Secondary | ICD-10-CM

## 2018-01-30 DIAGNOSIS — B9689 Other specified bacterial agents as the cause of diseases classified elsewhere: Secondary | ICD-10-CM

## 2018-01-30 DIAGNOSIS — Z7251 High risk heterosexual behavior: Secondary | ICD-10-CM | POA: Diagnosis not present

## 2018-01-30 LAB — POCT URINE PREGNANCY: Preg Test, Ur: NEGATIVE

## 2018-01-30 LAB — POCT WET PREP (WET MOUNT)
Clue Cells Wet Prep Whiff POC: NEGATIVE
Trichomonas Wet Prep HPF POC: ABSENT

## 2018-01-30 MED ORDER — FLUCONAZOLE 150 MG PO TABS: 150.0000 mg | ORAL_TABLET | Freq: Once | ORAL | 0 refills | Status: AC

## 2018-01-30 MED ORDER — METRONIDAZOLE 500 MG PO TABS: 500.0000 mg | ORAL_TABLET | Freq: Two times a day (BID) | ORAL | 0 refills | Status: DC

## 2018-01-30 NOTE — Progress Notes (Signed)
   CC: STI testing  HPI: Erin Wilkins is a 22 y.o. female with PMH significant for repeated STI testing and hx of vaginal discharge who presents to [facility] today with vaginal discharge asking for STI testing.  VAGINAL DISCHARGE  Having vaginal discharge for 9 days. Discharge consistency: white and clumpy Discharge color: white, clumpy Medications tried: no  Recent antibiotic use: no Sex in last month: yes, 1 female partner Possible STD exposure:unknown  Symptoms Fever: no Dysuria:no Vaginal bleeding: no Abdomen or Pelvic pain: no Back pain: no Genital sores or ulcers:no Rash: no Pain during sex: no Missed menstrual period: yes but on nexplanon  ROS see HPI Smoking Status noted   Review of Symptoms:  See HPI for ROS.   CC, SH/smoking status, and VS noted.  Objective: BP 102/78   Temp 99.5 F (37.5 C) (Oral)   Ht 5\' 1"  (1.549 m)   Wt 108 lb 9.6 oz (49.3 kg)   BMI 20.52 kg/m  GEN: NAD, alert, cooperative, and pleasant. GI: soft, non-tender, non-distended, no hepatosplenomegaly SKIN: warm and dry, no rashes or lesions NEURO: II-XII grossly intact, normal gait, peripheral sensation intact Female genitalia: Vulva: normal appearing vulva with no masses, tenderness or lesions Vagina: normal appearing Cervix: erythematous with multiple areas of irritation/redness appreciated over normal pink mucosa Adnexa: normal adnexa in size, nontender and no masses  Assessment and plan:  1.  Repeated exposure to sexually transmitted infection -red flags and counseling include patient's casual attitude towards repeated STI testing.  I asked her if she uses condoms and she reported "no I just come in for STI testing."  Counseling on short and long-term risks of repeated STI exposure were discussed with the patient. - POCT urine pregnancy - HIV Antibody (routine testing w rflx) - RPR - POCT Wet Prep [facility]) - Cervicovaginal ancillary only  2. BV (bacterial vaginosis) -noted on wet  prep.  Was previously seen and not to treated for this due to suspicion that her symptoms were caused only by the vaginal yeast infection.  Because she is coming back in today with the same symptoms at follow-up I think it is reasonable to treat her for bacterial vaginosis at this time.  Advised her to complete the entire course of antibiotics. - metroNIDAZOLE (FLAGYL) 500 MG tablet; Take 1 tablet (500 mg total) by mouth 2 (two) times daily.  Dispense: 14 tablet; Refill: 0  3. Yeast infection - also noted on wet prep. - fluconazole (DIFLUCAN) 150 MG tablet; Take 1 tablet (150 mg total) by mouth once for 1 dose.  Dispense: 1 tablet; Refill: 0  4 .Abnormal appearing cervix -patchy, irritated appearance of the cervix.  This can be due to recent intercourse or tampon use.  Advised patient to come back for a repeat cervical check so that we can see it is resolved, and if not we may consider an early Pap for her.  She is not due yet. Last pap was 03/2017 during pregnancy and normal.  Howard Pouch, MD,MS,  PGY3 01/30/2018 3:58 PM

## 2018-01-30 NOTE — Patient Instructions (Signed)
It was a pleasure seeing you today in our clinic. Here is the treatment plan we have discussed and agreed upon together:  Please schedule a visit in clinic for a repeat check of your cervix. You may need a pap smear at that time if it continues to appear abnormal.  Our clinic's number is [phone]. Please call with questions or concerns about what we discussed today.  Be well, Dr. Mosetta Putt

## 2018-01-31 LAB — RPR: RPR Ser Ql: NONREACTIVE

## 2018-01-31 LAB — HIV ANTIBODY (ROUTINE TESTING W REFLEX): HIV SCREEN 4TH GENERATION: NONREACTIVE

## 2018-02-03 ENCOUNTER — Encounter: Payer: Self-pay | Admitting: Student in an Organized Health Care Education/Training Program

## 2018-02-03 LAB — CERVICOVAGINAL ANCILLARY ONLY
Chlamydia: NEGATIVE
Neisseria Gonorrhea: NEGATIVE

## 2018-02-04 ENCOUNTER — Encounter: Payer: Self-pay | Admitting: Student in an Organized Health Care Education/Training Program

## 2018-03-04 ENCOUNTER — Ambulatory Visit (INDEPENDENT_AMBULATORY_CARE_PROVIDER_SITE_OTHER): Payer: Self-pay

## 2018-03-04 DIAGNOSIS — Z23 Encounter for immunization: Secondary | ICD-10-CM

## 2018-04-09 ENCOUNTER — Ambulatory Visit (INDEPENDENT_AMBULATORY_CARE_PROVIDER_SITE_OTHER): Payer: Self-pay | Admitting: Family Medicine

## 2018-04-09 ENCOUNTER — Other Ambulatory Visit: Payer: Self-pay

## 2018-04-09 VITALS — BP 106/82 | HR 78 | Temp 98.1°F | Ht 61.0 in | Wt 107.4 lb

## 2018-04-09 DIAGNOSIS — Z3200 Encounter for pregnancy test, result unknown: Secondary | ICD-10-CM

## 2018-04-09 DIAGNOSIS — N898 Other specified noninflammatory disorders of vagina: Secondary | ICD-10-CM

## 2018-04-09 DIAGNOSIS — Z3202 Encounter for pregnancy test, result negative: Secondary | ICD-10-CM

## 2018-04-09 LAB — POCT WET PREP (WET MOUNT): TRICHOMONAS WET PREP HPF POC: ABSENT

## 2018-04-09 LAB — POCT URINE PREGNANCY: PREG TEST UR: NEGATIVE

## 2018-04-09 MED ORDER — FLUCONAZOLE 150 MG PO TABS: 150.0000 mg | ORAL_TABLET | Freq: Once | ORAL | 0 refills | Status: AC

## 2018-04-09 NOTE — Progress Notes (Signed)
   CC: Vaginal discharge  HPI  Vaginal discharge- no urinary sxs. No fever. Last sex was with condom 2 days ago. No new partners since last visit, has used a condom every time. Clumpy white discharge, no odor. Uses dove sensitive around her vagina but not inside.  Says that she got nauseous with the pills she got last time, likely Flagyl.  Did also get Diflucan.  Birth contro wise she is on the Nexplanon and already has an appointment to remove and replace this.  ROS: Denies CP, SOB, abdominal pain, dysuria, changes in BMs.   CC, SH/smoking status, and VS noted  Objective: BP 106/82   Pulse 78   Temp 98.1 F (36.7 C) (Oral)   Ht 5\' 1"  (1.549 m)   Wt 107 lb 6.4 oz (48.7 kg)   SpO2 99%   BMI 20.29 kg/m  Gen: NAD, alert, cooperative, and pleasant. GU: Normal external vagina, no cervical or adnexal tenderness, normal cervix easily visualized Neuro: Alert and oriented, Speech clear, No gross deficits  Assessment and plan:  Vaginal discharge - wet prep with yeast. Diflucan. Encouraged not to use soap, continue safe sex practices. Did not perform STI testing as was done 6 weeks ago and no new partners, has used condoms.   Orders Placed This Encounter  Procedures  . POCT urine pregnancy  . POCT Wet Prep [facility]([place] [place])    No orders of the defined types were placed in this encounter.  Loni MuseKate Timberlake, MD, PGY3 04/09/2018 3:52 PM

## 2018-04-09 NOTE — Patient Instructions (Signed)
Patient declined AVS 

## 2018-04-10 ENCOUNTER — Telehealth: Payer: Self-pay

## 2018-04-10 NOTE — Telephone Encounter (Signed)
White team, can you call patient and remind her? We didn't recheck STI testing yesterday since her labs were all negative on last test 9/26 and she hadn't had new partners. Her pregnancy test was also negative if that's what she's curious about.

## 2018-04-10 NOTE — Telephone Encounter (Signed)
Spoke to pt. She asked if anything showed up in her wet prep or urine that would say she has a UTI. Pt denies burning with urination. Pt states she seems to be going to the bathroom a lot. I spoke to Dr. Mosetta PuttFeng who is covering for Dr. Chanetta Marshallimberlake. Dr Mosetta PuttFeng suggest pt take the diflucan and if she isn't feeling better by Monday to give us a call. Pt said she would pick up medication and will call if not feeling better on Monday. Sunday SpillersSharon T Rey Fors, CMA

## 2018-04-10 NOTE — Telephone Encounter (Signed)
Patient calling for test results.  [phone]  Ples SpecterAlisa Brake, RN [facility] RN)

## 2018-04-10 NOTE — Telephone Encounter (Signed)
PC to pt. Mailbox is full. If pt calls, please give her the information below. Erin SpillersSharon T Darlyne Wilkins, CMA

## 2018-04-15 ENCOUNTER — Telehealth: Payer: Self-pay | Admitting: Family Medicine

## 2018-04-15 NOTE — Telephone Encounter (Signed)
Pt would like Dr. Chanetta Marshallimberlake to give her a call back to discuss her still having yeast infection symptoms. Please advise

## 2018-04-16 NOTE — Telephone Encounter (Signed)
Called patient re question: She states she thought she had a UTI because she feels a sensation of incomplete emptying and needing to void immediately after voiding.  She states she has a history of UTI.  She does not have any other symptoms that are making her worry about infection.  I reassured her that her last urine culture was actually negative.  I explained that incomplete emptying is not usually a presentation of UTI, but if this persists we could consider full urological evaluation.  I asked her to keep a diary of the frequency of the symptoms and return in 1 month for us to discuss this.  We could consider referral to urology for voiding studies. Reviewed symptoms of UTI which typically include frequency, burning, dysuria.  Asked her to call with any of these symptoms recur or worsen.

## 2018-04-18 ENCOUNTER — Ambulatory Visit (INDEPENDENT_AMBULATORY_CARE_PROVIDER_SITE_OTHER): Payer: Medicaid Other | Admitting: Family Medicine

## 2018-04-18 ENCOUNTER — Other Ambulatory Visit: Payer: Self-pay

## 2018-04-18 VITALS — BP 102/78 | HR 90 | Temp 98.4°F | Ht 61.0 in | Wt 106.4 lb

## 2018-04-18 DIAGNOSIS — R399 Unspecified symptoms and signs involving the genitourinary system: Secondary | ICD-10-CM | POA: Insufficient documentation

## 2018-04-18 LAB — POCT URINALYSIS DIP (MANUAL ENTRY)
BILIRUBIN UA: NEGATIVE
Glucose, UA: NEGATIVE mg/dL
Nitrite, UA: NEGATIVE
Protein Ur, POC: NEGATIVE mg/dL
Spec Grav, UA: 1.025 (ref 1.010–1.025)
Urobilinogen, UA: 1 E.U./dL
pH, UA: 6.5 (ref 5.0–8.0)

## 2018-04-18 LAB — POCT UA - MICROSCOPIC ONLY: WBC, Ur, HPF, POC: >20

## 2018-04-18 LAB — POCT URINE PREGNANCY: Preg Test, Ur: NEGATIVE

## 2018-04-18 MED ORDER — NITROFURANTOIN MONOHYD MACRO 100 MG PO CAPS: 100.0000 mg | ORAL_CAPSULE | Freq: Two times a day (BID) | ORAL | 0 refills | Status: AC

## 2018-04-18 NOTE — Assessment & Plan Note (Signed)
Patient with burning at end of urination stream, no suprapubic tenderness, no urgency concerns.  We did review the O'Leary-Sandt problem symptom index for interstitial cystitis and her score was a 3 which indicated this was very unlikely.  This was discussed with patient  U. Pred negative, UA indicating UTI.  Patient has allergy listed to cefazolin's and penicillins.  Discussed with pharmacy Macrobid ordered, patient called on the phone and notified.

## 2018-04-18 NOTE — Patient Instructions (Signed)
It was a pleasure to see you today! Thank you for choosing [facility] for your primary care. Erin MarketJada Wilkins was seen for burning with urination. Come back to the clinic if you have any new symptoms, and go to the emergency room if you have any life-threatening symptoms.  Today we talked about your urinary symptoms.  We also discussed your history of UTI and vaginal infections.  It seems that the most recent treatment has been doing well and you have only this minor urinary symptom.  We are pulling a urine pregnancy and urine analysis to check for infection.  We went over a questionnaire that seemed to rule out interstitial cystitis which is a more serious chronic problem.  We will call you if the results require treatment.   If we did any lab work today that did not result today, one of two things will happen.  1. If everything is normal, you will get a letter in mail sent to the address in your chart with the results for your records.  It is important to keep your address up to date as that is where we will send results.  2. If the results require some sort of discussion, my nurses or myself will call you on the phone number listed in your records.  It is important to keep your phone number up to date in our system as this is how we will try to reach you.  If we cannot reach you on the phone, we will try to send you a letter in the mail so please enable to voicemail function of your phone.  If you don't hear from us in two weeks, please give us a call to verify your results. Otherwise, we look forward to seeing you again at your next visit. If you have any questions or concerns before then, please call the clinic at [phone].   Please bring all your medications to every doctors visit   Sign up for My Chart to have easy access to your labs results, and communication with your Primary care physician.     Please check-out at the front desk before leaving the clinic.     Best,  Dr. Marthenia RollingScott  [facility] RESIDENT - PGY2 04/18/2018 10:27 AM

## 2018-04-18 NOTE — Progress Notes (Signed)
    Subjective:  Erin Wilkins is a 22 y.o. female who presents to the [facility] today with a chief complaint of dysuria at the end of her stream.   HPI: Patient with burning at end of urination stream, no suprapubic tenderness, no urgency concerns.  We did review the O'Leary-Sandt problem symptom index for interstitial cystitis and her score was a 3.  Patient with significant recurrent history of both UTI and vaginal infection.  We discussed antibiotic contribution to recurring yeast infections.  She denies symptoms of vaginal itching or discharge at this time, she was offered a pelvic exam and/or a self swab and declined.  She does consent to urinary testing.  She notes she is currently on her menses  She has no other complaints or symptoms that she wishes to discuss at this time   Objective:  Physical Exam: BP 102/78   Pulse 90   Temp 98.4 F (36.9 C) (Oral)   Ht 5\' 1"  (1.549 m)   Wt 106 lb 6.4 oz (48.3 kg)   SpO2 98%   BMI 20.10 kg/m   Gen: NAD, resting comfortably GI: Normal bowel sounds present. Soft, Nontender, Nondistended. MSK: no edema, cyanosis, or clubbing noted Skin: warm, dry Neuro: grossly normal, moves all extremities Psych: Normal affect and thought content  Results for orders placed or performed in visit on 04/18/18 (from the past 72 hour(s))  POCT urinalysis dipstick     Status: Abnormal   Collection Time: 04/18/18 10:04 AM  Result Value Ref Range   Color, UA yellow yellow   Clarity, UA cloudy (A) clear   Glucose, UA negative negative mg/dL   Bilirubin, UA negative negative   Ketones, POC UA small (15) (A) negative mg/dL   Spec Grav, UA 1.6101.025 [id number] - 1.025   Blood, UA moderate (A) negative   pH, UA 6.5 5.0 - 8.0   Protein Ur, POC negative negative mg/dL   Urobilinogen, UA 1.0 0.2 or 1.0 E.U./dL   Nitrite, UA Negative Negative   Leukocytes, UA Moderate (2+) (A) Negative  POCT urine pregnancy     Status: None   Collection Time: 04/18/18 10:04 AM  Result Value Ref  Range   Preg Test, Ur Negative Negative  POCT UA - Microscopic Only     Status: Abnormal   Collection Time: 04/18/18 10:04 AM  Result Value Ref Range   WBC, Ur, HPF, POC >20    RBC, urine, microscopic 3-8    Bacteria, U Microscopic FEW    Epithelial cells, urine per micros 5-10      Assessment/Plan:  UTI symptoms Patient with burning at end of urination stream, no suprapubic tenderness, no urgency concerns.  We did review the O'Leary-Sandt problem symptom index for interstitial cystitis and her score was a 3 which indicated this was very unlikely.  This was discussed with patient  U. Pred negative, UA indicating UTI.  Patient has allergy listed to cefazolin's and penicillins.  Discussed with pharmacy Macrobid ordered, patient called on the phone and notified.   Marthenia RollingScott Yarnell Arvidson, DO FAMILY MEDICINE RESIDENT - PGY2 04/18/2018 10:51 AM

## 2018-04-23 ENCOUNTER — Ambulatory Visit: Payer: Medicaid Other | Admitting: Family Medicine

## 2018-04-24 ENCOUNTER — Ambulatory Visit (INDEPENDENT_AMBULATORY_CARE_PROVIDER_SITE_OTHER): Payer: Medicaid Other | Admitting: Family Medicine

## 2018-04-24 ENCOUNTER — Other Ambulatory Visit: Payer: Self-pay

## 2018-04-24 VITALS — BP 108/70 | Temp 98.1°F | Wt 106.0 lb

## 2018-04-24 DIAGNOSIS — Z3202 Encounter for pregnancy test, result negative: Secondary | ICD-10-CM | POA: Diagnosis not present

## 2018-04-24 DIAGNOSIS — N939 Abnormal uterine and vaginal bleeding, unspecified: Secondary | ICD-10-CM

## 2018-04-24 DIAGNOSIS — N926 Irregular menstruation, unspecified: Secondary | ICD-10-CM

## 2018-04-24 HISTORY — DX: Irregular menstruation, unspecified: N92.6

## 2018-04-24 LAB — POCT HEMOGLOBIN: Hemoglobin: 12.6 g/dL (ref 11–14.6)

## 2018-04-24 LAB — POCT URINE PREGNANCY: Preg Test, Ur: NEGATIVE

## 2018-04-24 MED ORDER — NORGESTIMATE-ETH ESTRADIOL 0.25-35 MG-MCG PO TABS: 1.0000 | ORAL_TABLET | Freq: Every day | ORAL | 0 refills | Status: DC

## 2018-04-24 NOTE — Assessment & Plan Note (Addendum)
Patient present with 2 weeks of bleeding worse in the past 5 days. Patient reports this has happened to her in the past initially when she had her nexplanon placed but not since. However upon charge review, she had similar presentation a few month ago (11/2017) and was seen by her PCP. Etiology was then unclear but AUB workup was not initiated due to the acute presentation. Bleeding now seem to be more severe in nature. Could be secondary to nexplanon ow that she is getting towards to end if her implant cycle. Urine pregnancy was negative. POC hemoglobin was 12.6 and therefore did not feel need to place patient on iron therapy. However given volume of bleeding reported in the past 5 days, patient would benefit from OCP to have estrogen help control bleeding. She will follow up in two weeks with PCP for nexplanon removal and reassessment of her bleeding. --Prescribed sprintec 3 tabs a day for the next 5 days --Follow up as needed if no improvement in symptoms

## 2018-04-24 NOTE — Patient Instructions (Signed)
It was great seeing you today! We have addressed the following issues today  1. I am starting you on a low dose combined progesterone and estrogen to help stop the bleeding. Take 1 pill three times a day for the next 7 days.  2. Make sure you make an appointment with your regular doctor for your nexplanon removal and discuss contraceptive options. 3. You Hemoglobin is just a little below normal and you will not need to be started on iron.  If we did any lab work today, and the results require attention, either me or my nurse will get in touch with you. If everything is normal, you will get a letter in mail and a message via . If you don't hear from us in two weeks, please give us a call. Otherwise, we look forward to seeing you again at your next visit. If you have any questions or concerns before then, please call the clinic at [phone].  Please bring all your medications to every doctors visit  Sign up for My Chart to have easy access to your labs results, and communication with your Primary care physician. Please ask Front Desk for some assistance.   Please check-out at the front desk before leaving the clinic.    Take Care,   Dr. Sydnee Cabaliallo

## 2018-04-24 NOTE — Progress Notes (Signed)
Subjective:    Patient ID: Erin Wilkins, female    DOB: 10/22/1995, 22 y.o.   MRN: [id number]   CC: Abnormal menstrual bleeding  HPI: Patient is a 22 yo female who presents today complaining of menstrual bleeding for the past 13 days worsening the past 5 days. Patient reports she has a nexplanon that is due for removal in 2 weeks. She reports that she had similar bleeding episode initially after nexplanon placement, it last 1-2 months and then her returned to her normal cycle which lasted about 6 days with normal bleeding. However in the past two weeks she reports bleeding has worsening she is now soaking pads every 30 min to hour. She denies any dizziness, shortness of breath but reports some fatigue. Patient would like to place another nexplanon in two weeks since she did not have any issue in the past 3 years.   Smoking status reviewed   ROS: all other systems were reviewed and are negative other than in the HPI   Past Medical History:  Diagnosis Date  . Anemia   . Pertussis 03/18/2012  . UTI (urinary tract infection)     Past Surgical History:  Procedure Laterality Date  . NO PAST SURGERIES      Past medical history, surgical, family, and social history reviewed and updated in the EMR as appropriate.  Objective:  BP 108/70   Temp 98.1 F (36.7 C) (Oral)   Wt 106 lb (48.1 kg)   BMI 20.03 kg/m   Vitals and nursing note reviewed  General: NAD, pleasant, able to participate in exam Cardiac: RRR, normal heart sounds, no murmurs. 2+ radial and PT pulses bilaterally Respiratory: CTAB, normal effort, No wheezes, rales or rhonchi Abdomen: soft, nontender, nondistended, no hepatic or splenomegaly, +BS Extremities: no edema or cyanosis. WWP. Skin: warm and dry, no rashes noted Neuro: alert and oriented x4, no focal deficits Psych: Normal affect and mood   Assessment & Plan:   Abnormal uterine bleeding (AUB) Patient present with 2 weeks of bleeding worse in the past 5 days.  Patient reports this has happened to her in the past initially when she had her nexplanon placed but not since. However upon charge review, she had similar presentation a few month ago (11/2017) and was seen by her PCP. Etiology was then unclear but AUB workup was not initiated due to the acute presentation. Bleeding now seem to be more severe in nature. Could be secondary to nexplanon ow that she is getting towards to end if her implant cycle. Urine pregnancy was negative. POC hemoglobin was 12.6 and therefore did not feel need to place patient on iron therapy. However given volume of bleeding reported in the past 5 days, patient would benefit from OCP to have estrogen help control bleeding. She will follow up in two weeks with PCP for nexplanon removal and reassessment of her bleeding. --Prescribed sprintec 3 tabs a day for the next 5 days --Follow up as needed if no improvement in symptoms    Lovena Neighbours, MD [facility] PGY-3

## 2018-05-13 ENCOUNTER — Telehealth: Payer: Self-pay | Admitting: *Deleted

## 2018-05-13 NOTE — Telephone Encounter (Signed)
LM for patient to call back.  Mother while she was being seen that patient would prefer that Dr. Sydnee Cabal remove the nexplanon.  Asked patient to call back.  Please let her know of the appointment I made on Friday and if this will work for her.  Jazmin Hartsell,CMA

## 2018-05-14 ENCOUNTER — Ambulatory Visit: Payer: Medicaid Other | Admitting: Family Medicine

## 2018-05-16 ENCOUNTER — Ambulatory Visit: Payer: Medicaid Other

## 2018-06-12 ENCOUNTER — Ambulatory Visit: Payer: Medicaid Other | Admitting: Family Medicine

## 2018-06-16 ENCOUNTER — Emergency Department (HOSPITAL_COMMUNITY)
Admission: EM | Admit: 2018-06-16 | Discharge: 2018-06-16 | Disposition: A | Discharge status: Home / Self Care | Payer: Medicaid Other | Attending: Emergency Medicine | Admitting: Emergency Medicine

## 2018-06-16 ENCOUNTER — Encounter (HOSPITAL_COMMUNITY): Payer: Self-pay

## 2018-06-16 DIAGNOSIS — J111 Influenza due to unidentified influenza virus with other respiratory manifestations: Secondary | ICD-10-CM | POA: Insufficient documentation

## 2018-06-16 DIAGNOSIS — R05 Cough: Secondary | ICD-10-CM | POA: Insufficient documentation

## 2018-06-16 DIAGNOSIS — Z79899 Other long term (current) drug therapy: Secondary | ICD-10-CM | POA: Insufficient documentation

## 2018-06-16 DIAGNOSIS — M791 Myalgia, unspecified site: Secondary | ICD-10-CM | POA: Insufficient documentation

## 2018-06-16 DIAGNOSIS — R69 Illness, unspecified: Secondary | ICD-10-CM

## 2018-06-16 LAB — GROUP A STREP BY PCR: Group A Strep by PCR: NOT DETECTED

## 2018-06-16 MED ORDER — IBUPROFEN 800 MG PO TABS
800.0000 mg | ORAL_TABLET | Freq: Once | ORAL | Status: AC
  Administered 2018-06-16: 800 mg via ORAL
  Filled 2018-06-16: qty 1

## 2018-06-16 MED ORDER — ACETAMINOPHEN 325 MG PO TABS
650.0000 mg | ORAL_TABLET | Freq: Once | ORAL | Status: AC | PRN
  Administered 2018-06-16: 650 mg via ORAL
  Filled 2018-06-16: qty 2

## 2018-06-16 NOTE — ED Triage Notes (Signed)
Pt arrived stating she has a sore throat yesterday and a cough. Pt states she has just not felt well. Pt febrile in triage.

## 2018-06-16 NOTE — ED Provider Notes (Signed)
Awaiting strep.  Flu like ilness.   Patient with negative strep test.  Her vital signs have improved.  Patient is appropriate for discharge at this time   Delos Haring 06/17/18 0002    Loren Racer, MD 06/20/18 2124

## 2018-06-16 NOTE — ED Notes (Signed)
Pt given water and able to keep it down without any difficulty. 

## 2018-06-16 NOTE — Discharge Instructions (Signed)
Follow-up with your doctor tomorrow as scheduled.  Return to ER for new or worsening symptoms. Home to rest, push hydrating fluids such as Gatorade. Take Motrin and Tylenol as needed as directed for pain and fevers.

## 2018-06-16 NOTE — ED Provider Notes (Signed)
[facility]-EMERGENCY DEPT Provider Note   CSN: [id number] Arrival date & time: 06/16/18  1953     History   Chief Complaint Chief Complaint  Patient presents with  . Sore Throat  . Cough    HPI Erin Wilkins is a 23 y.o. female.  23 year old female presents with complaint of sore throat, cough, body aches.  Symptoms started yesterday morning upon waking, sore throat is worse with swallowing, no known sick contacts.  Patient came to the ER tonight because she felt like her heart rate was fast.  Patient was found to be febrile in triage with an oral temp of 102.4 and tachycardic with a heart rate of 112.  Patient was given Tylenol while in triage and states she feels a little bit better at this time.  Patient is scheduled to see her PCP tomorrow.  Patient is a non-smoker, no history of asthma or chronic lung disease.  No known sick contacts.  Patient did get a flu shot this season.  No other complaints or concerns.     Past Medical History:  Diagnosis Date  . Anemia   . Pertussis 03/18/2012  . UTI (urinary tract infection)     Patient Active Problem List   Diagnosis Date Noted  . Irregular uterine bleeding 04/24/2018  . UTI symptoms 04/18/2018  . Routine general medical examination at a health care facility 11/19/2017  . Eczema 06/11/2017  . 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 Allergic rhinitis 01/09/2013  . Abnormal uterine bleeding (AUB) 11/27/2011    Past Surgical History:  Procedure Laterality Date  . NO PAST SURGERIES       OB History    Gravida  1   Para  1   Term  1   Preterm      AB      Living  1     SAB      TAB      Ectopic      Multiple  0   Live Births  1            Home Medications    Prior to Admission medications   Medication Sig Start Date End Date Taking? Authorizing Provider  etonogestrel (NEXPLANON) 68 MG IMPL implant 1 each by Subdermal route once.    [provider]  metroNIDAZOLE (FLAGYL) 500 MG  tablet Take 1 tablet (500 mg total) by mouth 2 (two) times daily. 01/30/18   Howard Pouch, MD  norgestimate-ethinyl estradiol (ORTHO-CYCLEN,SPRINTEC,PREVIFEM) 0.25-35 MG-MCG tablet Take 1 tablet by mouth daily. 04/24/18   Lovena Neighbours, MD    Family History Family History  Problem Relation Age of Onset  . Hypertension Mother     Social History Social History   Tobacco Use  . Smoking status: Never Smoker  . Smokeless tobacco: Never Used  Substance Use Topics  . Alcohol use: No  . Drug use: No     Allergies   Amoxicillin; Ancef [cefazolin]; and Penicillins   Review of Systems Review of Systems  Constitutional: Positive for chills, diaphoresis and fever.  HENT: Positive for sore throat. Negative for congestion.   Respiratory: Positive for cough.   Musculoskeletal: Positive for back pain and myalgias.  Skin: Negative for rash and wound.  Allergic/Immunologic: Negative for immunocompromised state.  Neurological: Negative for weakness.  Hematological: Negative for adenopathy.  Psychiatric/Behavioral: Negative for confusion.  All other systems reviewed and are negative.    Physical Exam Updated Vital Signs BP 113/73   Pulse (!) 102  Temp 99.2 F (37.3 C) (Oral)   Resp 16   Ht 5\' 1"  (1.549 m)   Wt 49.9 kg   SpO2 96%   BMI 20.78 kg/m   Physical Exam Vitals signs and nursing note reviewed.  Constitutional:      General: She is not in acute distress.    Appearance: She is well-developed. She is not diaphoretic.  HENT:     Head: Normocephalic and atraumatic.     Right Ear: Tympanic membrane and ear canal normal.     Left Ear: Tympanic membrane and ear canal normal.     Nose: No congestion or rhinorrhea.     Mouth/Throat:     Pharynx: Uvula midline. Posterior oropharyngeal erythema present. No pharyngeal swelling, oropharyngeal exudate or uvula swelling.     Tonsils: No tonsillar exudate or tonsillar abscesses. Swelling: 1+ on the right. 1+ on the left.    Eyes:     Conjunctiva/sclera: Conjunctivae normal.  Neck:     Musculoskeletal: Neck supple.  Cardiovascular:     Rate and Rhythm: Regular rhythm. Tachycardia present.     Heart sounds: Normal heart sounds. No murmur.  Pulmonary:     Effort: Pulmonary effort is normal.     Breath sounds: Normal breath sounds.  Lymphadenopathy:     Cervical: No cervical adenopathy.  Skin:    General: Skin is warm and dry.  Neurological:     Mental Status: She is alert and oriented to person, place, and time.  Psychiatric:        Behavior: Behavior normal.      ED Treatments / Results  Labs (all labs ordered are listed, but only abnormal results are displayed) Labs Reviewed  GROUP A STREP BY PCR    EKG None  Radiology No results found.  Procedures Procedures (including critical care time)  Medications Ordered in ED Medications  acetaminophen (TYLENOL) tablet 650 mg (650 mg Oral Given 06/16/18 2023)  ibuprofen (ADVIL,MOTRIN) tablet 800 mg (800 mg Oral Given 06/16/18 2100)     Initial Impression / Assessment and Plan / ED Course  I have reviewed the triage vital signs and the nursing notes.  Pertinent labs & imaging results that were available during my care of the patient were reviewed by me and considered in my medical decision making (see chart for details).  Clinical Course as of Jun 16 2213  Mon Jun 16, 2018  [id number] 23 year old female with flulike symptoms onset yesterday with sore throat.  Patient came to the ER today because her heart rate was fast.  Patient was found to be febrile, fever was reduced with Motrin and Tylenol and heart rate has improved to 102 currently.  Suspect patient may have the flu, due to her sore throat we are also running a strep test.  Lab delay on strep results, patient care signed out to Arthor Captain, PA-C pending rapid strep.   [LM]    Clinical Course User Index [LM] Jeannie Fend, PA-C   Final Clinical Impressions(s) / ED Diagnoses   Final  diagnoses:  Influenza-like illness    ED Discharge Orders    None       Alden Hipp 06/16/18 2215    Pricilla Loveless, MD 06/16/18 [phone]

## 2018-06-17 ENCOUNTER — Other Ambulatory Visit: Payer: Self-pay

## 2018-06-17 ENCOUNTER — Ambulatory Visit (INDEPENDENT_AMBULATORY_CARE_PROVIDER_SITE_OTHER): Payer: Self-pay | Admitting: Family Medicine

## 2018-06-17 VITALS — BP 98/80 | HR 108 | Temp 98.8°F | Wt 107.0 lb

## 2018-06-17 DIAGNOSIS — B9789 Other viral agents as the cause of diseases classified elsewhere: Secondary | ICD-10-CM

## 2018-06-17 DIAGNOSIS — J069 Acute upper respiratory infection, unspecified: Secondary | ICD-10-CM

## 2018-06-17 NOTE — Progress Notes (Signed)
   Subjective:   Patient ID: Erin Wilkins    DOB: 27-Oct-1995, 23 y.o. female   MRN: [id number]  CC: cough and cold symptoms   HPI: Erin Wilkins is a 23 y.o. female who presents to clinic today for the following issue.  Cough and cold Coughing x 2 days.  Cough productive of yellow sputum and is associated with bad congestion and runny nose.   Sore throat but negative strep throat last night in ED.  Given Tylenol and Motrin yesterday evening which helped.   Fever to 102F around 11 PM.  Sister flu+ about 2 weeks ago.  No abdominal pain or diarrhea.  She has been eating less due to a decreased appetite but has been able to drink plenty of fluids. No nausea or vomiting. No body aches, fatigue and pain with cough.    ROS: See HPI for pertinent ROS.  Social: pt is a never smoker.  Medications reviewed. Objective:   BP 98/80   Pulse (!) 108   Temp 98.8 F (37.1 C) (Oral)   Wt 107 lb (48.5 kg)   LMP 06/03/2018   SpO2 98%   BMI 20.22 kg/m  Vitals and nursing note reviewed.  General: 23 year old female, well-appearing, NAD HEENT: NCAT, EOMI, PERRL, MMM, no rhinorrhea, oropharynx clear Neck: supple, no LAD CV: RRR no MRG  Lungs: CTAB, normal effort  Abdomen: soft, NTND, +bs  Skin: warm, dry, no rash, brisk cap refill  Extremities: warm and well perfused   Assessment & Plan:   Cough with congestion  Symptoms c/w viral URI. Normal exam, she is well hydrated and well-appearing. Discussed timeline for recovery.  Advised warm fluids, may add honey to soothe throat Handout provided Return if symptoms worsen or do not improve  Freddrick March, MD [facility] PGY-3

## 2018-06-17 NOTE — Patient Instructions (Addendum)
It was nice meeting you today.  You were seen in clinic for cough and cold symptoms which are most likely due to a viral upper respiratory tract infection.  As we discussed, this usually resolves over 7 to 10 days and does not require antibiotics. I would encourage drinking warm fluids even if your appetite is not 100% back.  You may add honey to soothe your throat.  If you have any new or worsening symptoms, please make an appointment to be seen by a provider again.

## 2018-06-20 ENCOUNTER — Ambulatory Visit: Payer: Medicaid Other | Admitting: Family Medicine

## 2018-06-25 ENCOUNTER — Telehealth: Payer: Self-pay | Admitting: Family Medicine

## 2018-06-25 NOTE — Telephone Encounter (Signed)
Spoke to pt. Informed her that the last PPD test we have was in 2018. Pt stated that she has had one done more recent and will get the results from that office. I schedule pt an appt for an STD check and another appt for a nexplanon removal. Sunday Spillers, CMA

## 2018-06-25 NOTE — Telephone Encounter (Signed)
Pt called and asking for a copy of her TB test result and a copy of her last physical. ad

## 2018-06-26 ENCOUNTER — Ambulatory Visit: Payer: Medicaid Other | Admitting: Family Medicine

## 2018-06-27 ENCOUNTER — Other Ambulatory Visit: Payer: Self-pay

## 2018-06-27 ENCOUNTER — Ambulatory Visit (INDEPENDENT_AMBULATORY_CARE_PROVIDER_SITE_OTHER): Payer: Self-pay | Admitting: Family Medicine

## 2018-06-27 VITALS — BP 102/62 | HR 70 | Temp 98.4°F | Wt 108.1 lb

## 2018-06-27 DIAGNOSIS — Z5321 Procedure and treatment not carried out due to patient leaving prior to being seen by health care provider: Secondary | ICD-10-CM

## 2018-06-30 ENCOUNTER — Ambulatory Visit (INDEPENDENT_AMBULATORY_CARE_PROVIDER_SITE_OTHER): Payer: Self-pay | Admitting: Family Medicine

## 2018-06-30 ENCOUNTER — Encounter: Payer: Self-pay | Admitting: Family Medicine

## 2018-06-30 ENCOUNTER — Other Ambulatory Visit (HOSPITAL_COMMUNITY)
Admission: RE | Admit: 2018-06-30 | Discharge: 2018-06-30 | Disposition: A | Discharge status: Home / Self Care | Payer: Medicaid Other | Source: Ambulatory Visit | Attending: Family Medicine | Admitting: Family Medicine

## 2018-06-30 ENCOUNTER — Other Ambulatory Visit: Payer: Self-pay

## 2018-06-30 VITALS — BP 110/60 | Temp 98.5°F | Wt 107.0 lb

## 2018-06-30 DIAGNOSIS — N939 Abnormal uterine and vaginal bleeding, unspecified: Secondary | ICD-10-CM

## 2018-06-30 DIAGNOSIS — Z3202 Encounter for pregnancy test, result negative: Secondary | ICD-10-CM

## 2018-06-30 DIAGNOSIS — B3731 Acute candidiasis of vulva and vagina: Secondary | ICD-10-CM

## 2018-06-30 DIAGNOSIS — R399 Unspecified symptoms and signs involving the genitourinary system: Secondary | ICD-10-CM | POA: Diagnosis present

## 2018-06-30 DIAGNOSIS — Z202 Contact with and (suspected) exposure to infections with a predominantly sexual mode of transmission: Secondary | ICD-10-CM

## 2018-06-30 DIAGNOSIS — B373 Candidiasis of vulva and vagina: Secondary | ICD-10-CM

## 2018-06-30 LAB — POCT WET PREP (WET MOUNT)
Clue Cells Wet Prep Whiff POC: NEGATIVE
Trichomonas Wet Prep HPF POC: ABSENT

## 2018-06-30 LAB — POCT URINE PREGNANCY: Preg Test, Ur: NEGATIVE

## 2018-06-30 LAB — POCT URINALYSIS DIP (MANUAL ENTRY)
Bilirubin, UA: NEGATIVE
Blood, UA: NEGATIVE
Glucose, UA: NEGATIVE mg/dL
Nitrite, UA: NEGATIVE
Protein Ur, POC: NEGATIVE mg/dL
Spec Grav, UA: >=1.03 — AB (ref 1.010–1.025)
Urobilinogen, UA: 1 E.U./dL
pH, UA: 6 (ref 5.0–8.0)

## 2018-06-30 MED ORDER — FLUCONAZOLE 150 MG PO TABS: 150.0000 mg | ORAL_TABLET | Freq: Once | ORAL | 0 refills | Status: AC

## 2018-06-30 NOTE — Patient Instructions (Signed)
It was good to see you today!   We are checking some labs today. If results require attention, either myself or my nurse will get in touch with you. If everything is normal, you will get a letter in the mail or a message in My Chart. Please give us a call if you do not hear from us after 2 weeks.  Feel free to call with any questions or concerns at any time, at [phone].   Take care,  Dr. Everline Mahaffy J Shanesha Bednarz, DO [facility]  

## 2018-06-30 NOTE — Progress Notes (Signed)
    Subjective:  Erin Wilkins is a 23 y.o. female who presents to the [facility] today with a chief complaint of vaginal discharge.   HPI:  Patient states that she has had vaginal discharge for the last 2 days ever since she after ended her menstrual cycle.  No itching or burning.  She is about 1 month overdue on switching out her Nexplanon, has an appointment for this already.  Her menstrual cycle lasted 2 to 3 weeks this month. She has no known STD exposures but would like to be tested today. She had a recent UTI.  She did feel that her urine smelled strong and had a sudden quick self-limited episode of mild back pain yesterday.  She has no dysuria, urgency, frequency.   ROS: Per HPI   Objective:  Physical Exam: BP 110/60   Temp 98.5 F (36.9 C) (Oral)   Wt 107 lb (48.5 kg)   LMP 06/03/2018   BMI 20.22 kg/m   Gen: NAD, resting comfortably Pulm: NWOB GI:  Soft, Nontender, Nondistended. GU: Pelvic exam: normal external genitalia, vulva, vagina, cervix, uterus and adnexa. Skin: warm, dry Neuro: grossly normal, moves all extremities Psych: Normal affect and thought content  Results for orders placed or performed in visit on 06/30/18 (from the past 72 hour(s))  POCT urine pregnancy     Status: None   Collection Time: 06/30/18  9:28 AM  Result Value Ref Range   Preg Test, Ur Negative Negative  POCT urinalysis dipstick     Status: Abnormal   Collection Time: 06/30/18  9:28 AM  Result Value Ref Range   Color, UA yellow yellow   Clarity, UA clear clear   Glucose, UA negative negative mg/dL   Bilirubin, UA negative negative   Ketones, POC UA small (15) (A) negative mg/dL   Spec Grav, UA >=9.201 (A) 1.010 - 1.025   Blood, UA negative negative   pH, UA 6.0 5.0 - 8.0   Protein Ur, POC negative negative mg/dL   Urobilinogen, UA 1.0 0.2 or 1.0 E.U./dL   Nitrite, UA Negative Negative   Leukocytes, UA Trace (A) Negative     Assessment/Plan:  1. UTI symptoms UTI resolved.  Patient is  well-appearing, afebrile.  Urinalysis does not show signs of acute infection.  Patient reassured - POCT urinalysis dipstick  2. Abnormal uterine bleeding (AUB) Pregnancy test is negative.  Patient has an appointment scheduled to replace her Nexplanon. - POCT urine pregnancy  3. Possible exposure to STD Wet prep consistent with vaginal yeast infection.  Called patient and left message that sending Diflucan over to her pharmacy.  GC/CT, HIV and RPR collected today.  We will follow-up on results. - HIV antibody (with reflex) - RPR - Cervicovaginal ancillary only - Wet prep  Leland Her, DO PGY-3, [facility] 06/30/2018 9:18 AM

## 2018-07-01 LAB — RPR: RPR Ser Ql: NONREACTIVE

## 2018-07-01 LAB — HIV ANTIBODY (ROUTINE TESTING W REFLEX): HIV Screen 4th Generation wRfx: NONREACTIVE

## 2018-07-01 NOTE — Progress Notes (Signed)
Left without being seen.

## 2018-07-03 LAB — CERVICOVAGINAL ANCILLARY ONLY
Chlamydia: NEGATIVE
Neisseria Gonorrhea: NEGATIVE

## 2018-07-07 ENCOUNTER — Ambulatory Visit: Payer: Medicaid Other | Admitting: Family Medicine

## 2018-08-19 ENCOUNTER — Telehealth (INDEPENDENT_AMBULATORY_CARE_PROVIDER_SITE_OTHER): Payer: Self-pay | Admitting: Family Medicine

## 2018-08-19 ENCOUNTER — Other Ambulatory Visit: Payer: Self-pay

## 2018-08-19 DIAGNOSIS — B9689 Other specified bacterial agents as the cause of diseases classified elsewhere: Secondary | ICD-10-CM | POA: Insufficient documentation

## 2018-08-19 DIAGNOSIS — N760 Acute vaginitis: Secondary | ICD-10-CM | POA: Insufficient documentation

## 2018-08-19 MED ORDER — METRONIDAZOLE 0.75 % VA GEL: 1.0000 | Freq: Every day | VAGINAL | 0 refills | Status: AC

## 2018-08-19 NOTE — Progress Notes (Signed)
[facility] Telemedicine Visit  Patient consented to have virtual visit. Method of visit: Telephone  Encounter participants: Patient: Erin Wilkins - located at home Provider: Unknown Jim - located at [facility] Others (if applicable): none  Chief Complaint: vaginal discharge  HPI:  Vaginal Discharge  Patient reports that discharge started 3 days ago.  She notes that discharge appears thin, clea.  She notes vaginal odors.  She denies vaginal pruritis, abnormal vaginal bleeding, dysuria, hematuria, pelvic pain, nausea, vomiting, fevers.   She states that this is "the exact same as the last time I had BV."  Has history of STIs, most recent in May 2019.  She is not currently sexually active and does use condoms when she is, most recent sexual encounter 2 months ago.   LMP today, 4/14.  She does not douche.  Patient states "this is not at all like when I have a yeast infection and there is no way that I have an STD."    ROS: per HPI  Pertinent PMHx: Eczema  Exam:  Respiratory: Patient speaking in complete sentences and no evidence of respiratory distress over the phone  Assessment/Plan:  Bacterial vaginosis History consistent with bacterial vaginosis.  Patient has a remote history of STDs, but no recent STD exposure.  History consistent with bacterial vaginosis.  Given this, will treat for bacterial vaginosis.  No red flag symptoms.  Patient requests cream.  No concern for pregnancy as patient started bleeding today. -[employer]ro[employer]el nightly x5 days -Patient to call back in 2 days if she notices worsening or no improvement, consider bringing in for wet prep and STD testing at that time    Time spent during visit with patient: 8 minutes

## 2018-08-19 NOTE — Assessment & Plan Note (Signed)
History consistent with bacterial vaginosis.  Patient has a remote history of STDs, but no recent STD exposure.  History consistent with bacterial vaginosis.  Given this, will treat for bacterial vaginosis.  No red flag symptoms.  Patient requests cream.  No concern for pregnancy as patient started bleeding today. -MetroGel nightly x5 days -Patient to call back in 2 days if she notices worsening or no improvement, consider bringing in for wet prep and STD testing at that time

## 2018-09-23 ENCOUNTER — Other Ambulatory Visit (HOSPITAL_COMMUNITY)
Admission: RE | Admit: 2018-09-23 | Discharge: 2018-09-23 | Disposition: A | Discharge status: Home / Self Care | Payer: Medicaid Other | Source: Ambulatory Visit | Attending: Family Medicine | Admitting: Family Medicine

## 2018-09-23 ENCOUNTER — Ambulatory Visit: Payer: Medicaid Other

## 2018-09-23 ENCOUNTER — Ambulatory Visit (INDEPENDENT_AMBULATORY_CARE_PROVIDER_SITE_OTHER): Payer: Self-pay | Admitting: Family Medicine

## 2018-09-23 ENCOUNTER — Encounter: Payer: Self-pay | Admitting: Family Medicine

## 2018-09-23 ENCOUNTER — Other Ambulatory Visit: Payer: Self-pay

## 2018-09-23 DIAGNOSIS — N898 Other specified noninflammatory disorders of vagina: Secondary | ICD-10-CM | POA: Insufficient documentation

## 2018-09-23 DIAGNOSIS — R300 Dysuria: Secondary | ICD-10-CM

## 2018-09-23 LAB — POCT URINALYSIS DIP (MANUAL ENTRY)
Bilirubin, UA: NEGATIVE
Glucose, UA: NEGATIVE mg/dL
Nitrite, UA: NEGATIVE
Protein Ur, POC: 30 mg/dL — AB
Spec Grav, UA: 1.025 (ref 1.010–1.025)
Urobilinogen, UA: 1 E.U./dL
pH, UA: 7 (ref 5.0–8.0)

## 2018-09-23 LAB — POCT WET PREP (WET MOUNT)
Clue Cells Wet Prep Whiff POC: NEGATIVE
Trichomonas Wet Prep HPF POC: ABSENT

## 2018-09-23 MED ORDER — NITROFURANTOIN MONOHYD MACRO 100 MG PO CAPS: 100.0000 mg | ORAL_CAPSULE | Freq: Two times a day (BID) | ORAL | 0 refills | Status: DC

## 2018-09-23 MED ORDER — FLUCONAZOLE 150 MG PO TABS: 150.0000 mg | ORAL_TABLET | Freq: Once | ORAL | 0 refills | Status: AC

## 2018-09-23 NOTE — Progress Notes (Signed)
   Subjective:   Patient ID: Erin Wilkins    DOB: Oct 14, 1995, 23 y.o. female   MRN: [id number]  CC: dysuria  HPI: [name] is a 23 y.o. female who presents to clinic today for the following issue.  Dysuria Symptoms present x2 days with dysuria and frequency.  She has had UTIs in the past with her last one being last year.  Also history of chlamydia last year which was treated with resolution of symptoms. She reports vaginal discharge that is white and thin.  No foul odor.  No itching or irritation. No fever, chills, nausea, vomiting.  No abnormal bleeding, abdominal pain or cramping.  Is sexually active with males only and recently has had a new sexual partner so she would like testing.    ROS: See HPI for pertinent ROS.  PMFSH: Pertinent past medical, surgical, family, and social history were reviewed and updated as appropriate. Smoking status reviewed. Medications reviewed. Objective:   There were no vitals taken for this visit. Vitals and nursing note reviewed.  General: 23 yo female, NAD  Neck: supple CV: RRR no MRG  Lungs: CTAB, normal effort  Abdomen: soft, nondistended, no suprapubic or CVA  tenderness, +bs  Skin: warm, dry, no rash Extremities: warm and well perfused  Assessment & Plan:   Dysuria UA negative for nitrite but moderate leukocytes and moderate blood.  Clinically c/w UTI and given history of them will treat as such.  Will allergy to penicillins, will treat with macrobid as this has previously worked well for her.  -Rx: Macrobid BID x5 days -Wet prep +for moderate yeast and many bacteria.  -Rx: Diflucan 2 tabs sent -GC chlamydia and RPR pending  -Declined HIV as recently tested negative in 06/2018 -Counseled on practicing safe sex  Orders Placed This Encounter  Procedures  . RPR  . POCT urinalysis dipstick  . POCT Wet Prep [facility])   Freddrick March, MD [facility] PGY-3

## 2018-09-24 LAB — CERVICOVAGINAL ANCILLARY ONLY
Chlamydia: POSITIVE — AB
Neisseria Gonorrhea: NEGATIVE

## 2018-09-24 LAB — RPR: RPR Ser Ql: NONREACTIVE

## 2018-09-30 ENCOUNTER — Other Ambulatory Visit: Payer: Self-pay | Admitting: Family Medicine

## 2018-09-30 NOTE — Progress Notes (Addendum)
Reviewed positive result for chlamydia and attempted to inform patient.  Unable to reach and LVM for her to call our clinic to come in for treatment.  OK to treat with Azithromycin when she arrives.

## 2018-10-06 NOTE — Progress Notes (Signed)
Attempted to call again.  No answer.  Will tray again later. Jone Baseman, CMA

## 2018-10-07 ENCOUNTER — Ambulatory Visit: Payer: Medicaid Other

## 2018-10-07 NOTE — Progress Notes (Signed)
LMOVM for callback again. Jone Baseman, CMA

## 2018-10-07 NOTE — Progress Notes (Signed)
Pt returned call and was informed.  She will come in this afternoon. Jone Baseman, CMA

## 2018-10-10 ENCOUNTER — Ambulatory Visit (INDEPENDENT_AMBULATORY_CARE_PROVIDER_SITE_OTHER): Payer: Self-pay | Admitting: *Deleted

## 2018-10-10 ENCOUNTER — Other Ambulatory Visit: Payer: Self-pay

## 2018-10-10 DIAGNOSIS — A749 Chlamydial infection, unspecified: Secondary | ICD-10-CM

## 2018-10-10 MED ORDER — AZITHROMYCIN 500 MG PO TABS
1000.0000 mg | ORAL_TABLET | Freq: Once | ORAL | Status: AC
  Administered 2018-10-10: 1000 mg via ORAL

## 2018-10-10 NOTE — Progress Notes (Signed)
Patient in nurse clinic today for STD treatment of Chlamydia.    Patient advised to abstain from sex for 7-10 days after treatment or when partner has been tested/treated.    Azithromycin 1 GM PO x 1 given per Dr. Shanda Bumps orders.  Advised to call if she was unable to keep down medication  Patient to follow up in 2-3 months for re-screening.    Provided condoms and advised to use with all sexual activity. Patient verbalized understanding.   STD report form fax completed and faxed to [facility] Department at [phone] (STD department).     Jone Baseman, CMA

## 2018-10-14 ENCOUNTER — Ambulatory Visit (INDEPENDENT_AMBULATORY_CARE_PROVIDER_SITE_OTHER): Payer: Self-pay | Admitting: Family Medicine

## 2018-10-14 ENCOUNTER — Other Ambulatory Visit: Payer: Self-pay

## 2018-10-14 VITALS — BP 100/60 | HR 80

## 2018-10-14 DIAGNOSIS — B373 Candidiasis of vulva and vagina: Secondary | ICD-10-CM

## 2018-10-14 DIAGNOSIS — B3731 Acute candidiasis of vulva and vagina: Secondary | ICD-10-CM | POA: Insufficient documentation

## 2018-10-14 DIAGNOSIS — A749 Chlamydial infection, unspecified: Secondary | ICD-10-CM

## 2018-10-14 DIAGNOSIS — Z3009 Encounter for other general counseling and advice on contraception: Secondary | ICD-10-CM

## 2018-10-14 MED ORDER — FLUCONAZOLE 150 MG PO TABS: 150.0000 mg | ORAL_TABLET | Freq: Once | ORAL | 0 refills | Status: AC

## 2018-10-14 NOTE — Progress Notes (Signed)
    Subjective:  Erin Wilkins is a 23 y.o. female who presents to the [facility] today with a chief complaint of discharge.   HPI: Patient with recent swab positive 5/16 for yeast (treated immediately) and chlamydia (treated 6/5, unsure of reason for delay).  Since treatment for chlamydia, discharge characteristics have changed to being thicker.  No wounds/lesions/rashes.  Does have some itchiness, no abdominal pain, no dysuria.  Patient would prefer to not get a swab today but was offered.  Objective:  Physical Exam: BP 100/60   Pulse 80   SpO2 98%   Gen: NAD, resting comfortably Pulm: NWOB MSK: no edema, cyanosis, or clubbing noted Skin: warm, dry Neuro: grossly normal, moves all extremities Psych: Normal affect and thought content  No results found for this or any previous visit (from the past 72 hour(s)).   Assessment/Plan:  Yeast infection involving the vagina and surrounding area Patient with recent dose of azithromycin for chlamydia on 6/5.  Last treatment of Diflucan was mid May.  Sensitive with of azithromycin she has had increasing thick white discharge consistent with prior yeast infections, some minor itching but no lesions.  We discussed the timing for test of cure with chlamydia and that it would be too early to test for this 4 days after treatment.  We did discuss that given the distance in time between her treatment for yeast infection and chlamydia that this may be a new yeast infection.  Patient would like to forego testing at this time treat empirically based off symptom description for yeast infection.  If this does not resolve in a week will come back for full testing.  Chlamydia Positive on May 19, treated on June 5.  Needs test of cure 3 to 4 weeks after June 5.  Patient aware  Birth control counseling Patient has Nexplanon which she says is expiring so she is planning to schedule a removal and replacement.  We did clean up her medication list which showed Sprintec,  which he said she was using for abnormal uterine bleeding which is since resolved and not been a problem since he has been off this for a number of months now.   Sherene Sires, DO FAMILY MEDICINE RESIDENT - PGY2 10/16/2018 10:43 AM

## 2018-10-14 NOTE — Assessment & Plan Note (Signed)
Positive on May 19, treated on June 5.  Needs test of cure 3 to 4 weeks after June 5.  Patient aware

## 2018-10-14 NOTE — Assessment & Plan Note (Signed)
Patient with recent dose of azithromycin for chlamydia on 6/5.  Last treatment of Diflucan was mid May.  Sensitive with of azithromycin she has had increasing thick white discharge consistent with prior yeast infections, some minor itching but no lesions.  We discussed the timing for test of cure with chlamydia and that it would be too early to test for this 4 days after treatment.  We did discuss that given the distance in time between her treatment for yeast infection and chlamydia that this may be a new yeast infection.  Patient would like to forego testing at this time treat empirically based off symptom description for yeast infection.  If this does not resolve in a week will come back for full testing.

## 2018-10-14 NOTE — Assessment & Plan Note (Signed)
Patient has Nexplanon which she says is expiring so she is planning to schedule a removal and replacement.  We did clean up her medication list which showed Sprintec, which he said she was using for abnormal uterine bleeding which is since resolved and not been a problem since he has been off this for a number of months now.

## 2018-10-15 ENCOUNTER — Ambulatory Visit (INDEPENDENT_AMBULATORY_CARE_PROVIDER_SITE_OTHER): Payer: Self-pay | Admitting: Student in an Organized Health Care Education/Training Program

## 2018-10-15 ENCOUNTER — Encounter: Payer: Self-pay | Admitting: Student in an Organized Health Care Education/Training Program

## 2018-10-15 ENCOUNTER — Other Ambulatory Visit: Payer: Self-pay

## 2018-10-15 VITALS — BP 98/60 | HR 100

## 2018-10-15 DIAGNOSIS — N760 Acute vaginitis: Secondary | ICD-10-CM

## 2018-10-15 DIAGNOSIS — N898 Other specified noninflammatory disorders of vagina: Secondary | ICD-10-CM

## 2018-10-15 DIAGNOSIS — A5901 Trichomonal vulvovaginitis: Secondary | ICD-10-CM | POA: Insufficient documentation

## 2018-10-15 DIAGNOSIS — A599 Trichomoniasis, unspecified: Secondary | ICD-10-CM

## 2018-10-15 DIAGNOSIS — B9689 Other specified bacterial agents as the cause of diseases classified elsewhere: Secondary | ICD-10-CM

## 2018-10-15 LAB — POCT WET PREP (WET MOUNT)
Clue Cells Wet Prep Whiff POC: POSITIVE
WBC, Wet Prep HPF POC: >20

## 2018-10-15 MED ORDER — METRONIDAZOLE 500 MG PO TABS: 500.0000 mg | ORAL_TABLET | Freq: Two times a day (BID) | ORAL | 0 refills | Status: AC

## 2018-10-15 NOTE — Assessment & Plan Note (Deleted)
Flagyl BID x7 days Return for chlamydia test of cure end of June 

## 2018-10-15 NOTE — Progress Notes (Signed)
   Subjective:    Patient ID: Erin Wilkins, female    DOB: 01-13-1996, 23 y.o.   MRN: [id number]   CC: vaginal discharge  HPI: Patient was seen for UTI symptoms about 1 month ago. She was ultimately treated for chlamydia 6/5. Patient completed treatment but continues to have dysuria. Was seen in clinic yesterday and treated for yeast infection but declined swab. Returns today for continued discharge after treatment. Denies vaginal pruritus. Endorses watery discharge with dysuria. Agreeable to swab today.  Smoking status reviewed   ROS: pertinent noted in the HPI   Past medical history, surgical, family, and social history reviewed and updated in the EMR as appropriate.  Objective:  BP 98/60   Pulse 100   SpO2 98%   Vitals and nursing note reviewed  General: NAD, pleasant, able to participate in exam Pelvic: external genitalia without abnormalities. Positive for excessive greenish thin discharge. Vaginal canal and cervix without erythema or edema.  Extremities: no edema or cyanosis. Skin: warm and dry, no rashes noted Neuro: alert, no obvious focal deficits Psych: Normal affect and mood   Assessment & Plan:    Trichomonal vaginitis Flagyl BID x7 days Return for chlamydia test of cure end of June called to inform patient of results.   Doristine Mango, [facility] PGY-1

## 2018-10-15 NOTE — Assessment & Plan Note (Signed)
Flagyl BID x7 days Return for chlamydia test of cure end of June

## 2018-10-17 ENCOUNTER — Encounter: Payer: Self-pay | Admitting: Family Medicine

## 2018-10-21 ENCOUNTER — Ambulatory Visit: Payer: Medicaid Other | Admitting: Family Medicine

## 2018-11-05 ENCOUNTER — Ambulatory Visit: Payer: Medicaid Other | Admitting: Family Medicine

## 2018-11-06 ENCOUNTER — Other Ambulatory Visit: Payer: Self-pay

## 2018-11-06 ENCOUNTER — Other Ambulatory Visit (HOSPITAL_COMMUNITY)
Admission: RE | Admit: 2018-11-06 | Discharge: 2018-11-06 | Disposition: A | Discharge status: Home / Self Care | Payer: Medicaid Other | Source: Ambulatory Visit | Attending: Family Medicine | Admitting: Family Medicine

## 2018-11-06 ENCOUNTER — Encounter: Payer: Self-pay | Admitting: Family Medicine

## 2018-11-06 ENCOUNTER — Ambulatory Visit (INDEPENDENT_AMBULATORY_CARE_PROVIDER_SITE_OTHER): Payer: Self-pay | Admitting: Family Medicine

## 2018-11-06 VITALS — BP 110/60 | HR 60 | Wt 111.0 lb

## 2018-11-06 DIAGNOSIS — A749 Chlamydial infection, unspecified: Secondary | ICD-10-CM | POA: Insufficient documentation

## 2018-11-06 DIAGNOSIS — A5901 Trichomonal vulvovaginitis: Secondary | ICD-10-CM

## 2018-11-06 DIAGNOSIS — Z30017 Encounter for initial prescription of implantable subdermal contraceptive: Secondary | ICD-10-CM

## 2018-11-06 DIAGNOSIS — B3731 Acute candidiasis of vulva and vagina: Secondary | ICD-10-CM

## 2018-11-06 DIAGNOSIS — Z3046 Encounter for surveillance of implantable subdermal contraceptive: Secondary | ICD-10-CM

## 2018-11-06 DIAGNOSIS — B373 Candidiasis of vulva and vagina: Secondary | ICD-10-CM

## 2018-11-06 LAB — POCT WET PREP (WET MOUNT)
Clue Cells Wet Prep Whiff POC: NEGATIVE
WBC, Wet Prep HPF POC: >20

## 2018-11-06 LAB — POCT URINE PREGNANCY: Preg Test, Ur: NEGATIVE

## 2018-11-06 MED ORDER — METRONIDAZOLE 500 MG PO TABS: 1000.0000 mg | ORAL_TABLET | Freq: Two times a day (BID) | ORAL | 0 refills | Status: AC

## 2018-11-06 MED ORDER — FLUCONAZOLE 150 MG PO TABS: 150.0000 mg | ORAL_TABLET | Freq: Once | ORAL | 0 refills | Status: AC

## 2018-11-06 NOTE — Patient Instructions (Addendum)
Dear Erin Wilkins,   It was very nice to see you! Thank you for taking your time to come in to be seen. Today, we discussed the following:   Test of Cure    I will send you results via MyChart for the labs that were sent out  I sent medication to the pharmacy for trichomonas. Please ensure that your partner is treated!   You may have a little bruising and soreness in your left arm. You can use ice and ibuprofen for pain.   Call if you have any questions  Good to see you!   Be well,   Dr. Genia Hotterachel Kavita Bartl [facility] [phone]   Sign up for MyChart for instant access to your health profile, labs, orders, upcoming appointments or to contact your provider with questions.    Trichomoniasis Trichomoniasis is an STI (sexually transmitted infection) that can affect both women and men. In women, the outer area of the female genitalia (vulva) and the vagina are affected. In men, mainly the penis is affected, but the prostate and other reproductive organs can also be involved.  This condition can be treated with medicine. It often has no symptoms (is asymptomatic), especially in men. If not treated, trichomoniasis can last for months or years. What are the causes? This condition is caused by a parasite called Trichomonas vaginalis. Trichomoniasis most often spreads from person to person (is contagious) through sexual contact. What increases the risk? The following factors may make you more likely to develop this condition:  Having unprotected sex.  Having sex with a partner who has trichomoniasis.  Having multiple sexual partners.  Having had previous trichomoniasis infections or other STIs. What are the signs or symptoms? In women, symptoms of trichomoniasis include:  Abnormal vaginal discharge that is clear, white, gray, or yellow-green and foamy and has an unusual "fishy" odor.  Itching and irritation of the vagina and vulva.  Burning or pain during urination or  sex.  Redness and swelling of the genitals. In men, symptoms of trichomoniasis include:  Penile discharge that may be foamy or contain pus.  Pain in the penis. This may happen only when urinating.  Itching or irritation inside the penis.  Burning after urination or ejaculation. How is this diagnosed? In women, this condition may be found during a routine Pap test or physical exam. It may be found in men during a routine physical exam. Your health care provider may do tests to help diagnose this infection, such as:  Urine tests (men and women).  The following in women: ? Testing the pH of the vagina. ? A vaginal swab test that checks for the Trichomonas vaginalis parasite. ? Testing vaginal secretions. Your health care provider may test you for other STIs, including HIV (human immunodeficiency virus). How is this treated? This condition is treated with medicine taken by mouth (orally), such as metronidazole or tinidazole, to fight the infection. Your sexual partner(s) also need to be tested and treated.  If you are a woman and you plan to become pregnant or think you may be pregnant, tell your health care provider right away. Some medicines that are used to treat the infection should not be taken during pregnancy. Your health care provider may recommend over-the-counter medicines or creams to help relieve itching or irritation. You may be tested for infection again 3 months after treatment. Follow these instructions at home:  Take and use over-the-counter and prescription medicines, including creams, only as told by your health care  provider.  Take your antibiotic medicine as told by your health care provider. Do not stop taking the antibiotic even if you start to feel better.  Do not have sex until 7-10 days after you finish your medicine, or until your health care provider approves. Ask your health care provider when you may start to have sex again.  (Women) Do not douche or wear  tampons while you have the infection.  Discuss your infection with your sexual partner(s). Make sure that your partner gets tested and treated, if necessary.  Keep all follow-up visits as told by your health care provider. This is important. How is this prevented?   Use condoms every time you have sex. Using condoms correctly and consistently can help protect against STIs.  Avoid having multiple sexual partners.  Talk with your sexual partner about any symptoms that either of you may have, as well as any history of STIs.  Get tested for STIs and STDs (sexually transmitted diseases) before you have sex. Ask your partner to do the same.  Do not have sexual contact if you have symptoms of trichomoniasis or another STI. Contact a health care provider if:  You still have symptoms after you finish your medicine.  You develop pain in your abdomen.  You have pain when you urinate.  You have bleeding after sex.  You develop a rash.  You feel nauseous or you vomit.  You plan to become pregnant or think you may be pregnant. Summary  Trichomoniasis is an STI (sexually transmitted infection) that can affect both women and men.  This condition often has no symptoms (is asymptomatic), especially in men.  Without treatment, this condition can last for months or years.  You should not have sex until 7-10 days after you finish your medicine, or until your health care provider approves. Ask your health care provider when you may start to have sex again.  Discuss your infection with your sexual partner(s). Make sure that your partner gets tested and treated, if necessary. This information is not intended to replace advice given to you by your health care provider. Make sure you discuss any questions you have with your health care provider. Document Released: 10/17/2000 Document Revised: 02/04/2018 Document Reviewed: 02/04/2018 Elsevier Patient Education  2020 [employer] Etonogestrel  implant What is this medicine? ETONOGESTREL (et oh noe JES trel) is a contraceptive (birth control) device. It is used to prevent pregnancy. It can be used for up to 3 years. This medicine may be used for other purposes; ask your health care provider or pharmacist if you have questions. COMMON BRAND NAME(S): Implanon, Nexplanon What should I tell my health care provider before I take this medicine? They need to know if you have any of these conditions:  abnormal vaginal bleeding  blood vessel disease or blood clots  breast, cervical, endometrial, ovarian, liver, or uterine cancer  diabetes  gallbladder disease  heart disease or recent heart attack  high blood pressure  high cholesterol or triglycerides  kidney disease  liver disease  migraine headaches  seizures  stroke  tobacco smoker  an unusual or allergic reaction to etonogestrel, anesthetics or antiseptics, other medicines, foods, dyes, or preservatives  pregnant or trying to get pregnant  breast-feeding How should I use this medicine? This device is inserted just under the skin on the inner side of your upper arm by a health care professional. Talk to your pediatrician regarding the use of this medicine in children. Special care may be needed.  Overdosage: If you think you have taken too much of this medicine contact a poison control center or emergency room at once. NOTE: This medicine is only for you. Do not share this medicine with others. What if I miss a dose? This does not apply. What may interact with this medicine? Do not take this medicine with any of the following medications:  amprenavir  fosamprenavir This medicine may also interact with the following medications:  acitretin  aprepitant  armodafinil  bexarotene  bosentan  carbamazepine  certain medicines for fungal infections like fluconazole, ketoconazole, itraconazole and voriconazole  certain medicines to treat hepatitis, HIV  or AIDS  cyclosporine  felbamate  griseofulvin  lamotrigine  modafinil  oxcarbazepine  phenobarbital  phenytoin  primidone  rifabutin  rifampin  rifapentine  St. John's wort  topiramate This list may not describe all possible interactions. Give your health care provider a list of all the medicines, herbs, non-prescription drugs, or dietary supplements you use. Also tell them if you smoke, drink alcohol, or use illegal drugs. Some items may interact with your medicine. What should I watch for while using this medicine? This product does not protect you against HIV infection (AIDS) or other sexually transmitted diseases. You should be able to feel the implant by pressing your fingertips over the skin where it was inserted. Contact your doctor if you cannot feel the implant, and use a non-hormonal birth control method (such as condoms) until your doctor confirms that the implant is in place. Contact your doctor if you think that the implant may have broken or become bent while in your arm. You will receive a user card from your health care provider after the implant is inserted. The card is a record of the location of the implant in your upper arm and when it should be removed. Keep this card with your health records. What side effects may I notice from receiving this medicine? Side effects that you should report to your doctor or health care professional as soon as possible:  allergic reactions like skin rash, itching or hives, swelling of the face, lips, or tongue  breast lumps, breast tissue changes, or discharge  breathing problems  changes in emotions or moods  if you feel that the implant may have broken or bent while in your arm  high blood pressure  pain, irritation, swelling, or bruising at the insertion site  scar at site of insertion  signs of infection at the insertion site such as fever, and skin redness, pain or discharge  signs and symptoms of a  blood clot such as breathing problems; changes in vision; chest pain; severe, sudden headache; pain, swelling, warmth in the leg; trouble speaking; sudden numbness or weakness of the face, arm or leg  signs and symptoms of liver injury like dark yellow or brown urine; general ill feeling or flu-like symptoms; light-colored stools; loss of appetite; nausea; right upper belly pain; unusually weak or tired; yellowing of the eyes or skin  unusual vaginal bleeding, discharge Side effects that usually do not require medical attention (report to your doctor or health care professional if they continue or are bothersome):  acne  breast pain or tenderness  headache  irregular menstrual bleeding  nausea This list may not describe all possible side effects. Call your doctor for medical advice about side effects. You may report side effects to FDA at 1-800-FDA-1088. Where should I keep my medicine? This drug is given in a hospital or clinic and will not  be stored at home. NOTE: This sheet is a summary. It may not cover all possible information. If you have questions about this medicine, talk to your doctor, pharmacist, or health care provider.  2020 Elsevier/Gold Standard (2017-03-12 14:11:42)

## 2018-11-06 NOTE — Progress Notes (Signed)
Established Patient - Clinic Visit Subjective  Subjective  Patient ID: MRN [id number]  Date of birth: 1995/06/29   PCP: Melene Plan, MD Name: Lorelee Market, 23 y.o. female  CC: test of cure  # Test of Cure for Chlamydia & Trichomoniasis   Patient denies discharge, vaginal itching, foul smell. She reports leaving her metronidazole at home during her last treatment and wasn't able to take x 2 days. Patient was treated with azithromycin on 09/30/2018.  She reports sexual intercourse with one partner at this time. She is unsure if he has been tested or taken medication for either Clamydia or trich. She does not use barrier protection.  #nexplanon removal and reinsertion  Patient Nexplanon overdue by 6 months.  She has not had any complications with the Nexplanon and would like reinsertion today.  ROS: See HPI  HISTORY Meds  Allergies: Reviewed as appropriate  Pertinent PMHx: Yeast infection, STDs, chlamydia Social Hx: Malila reports that she has never smoked. She has never used smokeless tobacco. She reports that she does not drink alcohol or use drugs.     Objective   Objective  Physical Exam:  BP 110/60   Pulse 60   Wt 111 lb (50.3 kg)   SpO2 98%   BMI 20.97 kg/m  General: NAD, alert, smiling GU: External vulva and vagina nonerythematous, without any obvious lesions or rash.  Green tinged creamy discharge appreciated. Normal ruggae of vaginal walls.  Cervix is non erythematous and non-friable.  There is no cervical motion tenderness.   Nexplanon Removal and Insertion  Patient identified, informed consent performed, consent signed.   Patient does understand that irregular bleeding is a very common side effect of this medication. She was advised to have backup contraception for one week after replacement of the implant. Pregnancy test in clinic today was negative.  Appropriate time out taken. Implanon site identified. Area prepped in usual sterile fashon. 3 ml of 1% lidocaine was used  to anesthetize the area at the distal end of the implant. A small stab incision was made right beside the implant on the distal portion. The Nexplanon rod was grasped using hemostats and removed without difficulty. There was minimal blood loss. There were no complications. Area was then injected with 3 ml of 1 % lidocaine. She was re-prepped with betadine, Nexplanon removed from packaging, Device confirmed in needle, then inserted full length of needle and withdrawn per handbook instructions. Nexplanon was able to palpated in the patient's arm; patient palpated the insert herself.  There was minimal blood loss. Patient insertion site covered with guaze and a pressure bandage to reduce any bruising. The patient tolerated the procedure well and was given post procedure instructions.  She was advised to have backup contraception for one week.     Pertinent Labs & Imaging:  Wet prep -- moderate bacteria and trichomonas present.  UPT Negative 09/13/18 Positive chlamydia    Reviewed in chart as appropriate   Assessment  Assessment & Plan  Trichomonal vaginitis  Trich on exam. Patient knows that partner needs to be treated, otherwise, she will contiue to get infection. As patient has poor compliance with medications as previously noted, will provide metronidazole dosing over 1 day rather than a multiple days.  Yeast vaginitis Yeast on wet prep today.  Will send 1 tab of Diflucan.  Patient has had multiple yeast infections this year.  Can consider chronic medication should patient desire, though she has poor compliance with medications.  Nexplanon removal Procedure note above  Nexplanon insertion Procedure as above    Chlamydia infection TOC today. Patient understands that partner needs to be treated as well.  We will follow-up results with patient.  Counseling for safe sex.  Patient reports only 1 partner at this time.  No new recent sexual contacts.   There are no preventive care reminders to  display for this patient. Health Maintenance discussed with patient and patient agrees to address when able.    Genia Hotter, M.D. [facility]  PGY -2 11/09/2018, 12:18 PM

## 2018-11-09 ENCOUNTER — Encounter: Payer: Self-pay | Admitting: Family Medicine

## 2018-11-09 DIAGNOSIS — B373 Candidiasis of vulva and vagina: Secondary | ICD-10-CM | POA: Insufficient documentation

## 2018-11-09 DIAGNOSIS — Z30017 Encounter for initial prescription of implantable subdermal contraceptive: Secondary | ICD-10-CM | POA: Insufficient documentation

## 2018-11-09 DIAGNOSIS — Z3046 Encounter for surveillance of implantable subdermal contraceptive: Secondary | ICD-10-CM | POA: Insufficient documentation

## 2018-11-09 DIAGNOSIS — B3731 Acute candidiasis of vulva and vagina: Secondary | ICD-10-CM | POA: Insufficient documentation

## 2018-11-09 NOTE — Assessment & Plan Note (Signed)
Trich on exam. Patient knows that partner needs to be treated, otherwise, she will contiue to get infection. As patient has poor compliance with medications as previously noted, will provide metronidazole dosing over 1 day rather than a multiple days.

## 2018-11-09 NOTE — Assessment & Plan Note (Addendum)
TOC today. Patient understands that partner needs to be treated as well.  We will follow-up results with patient.  Counseling for safe sex.  Patient reports only 1 partner at this time.  No new recent sexual contacts.

## 2018-11-09 NOTE — Assessment & Plan Note (Addendum)
Procedure as above

## 2018-11-09 NOTE — Assessment & Plan Note (Signed)
Yeast on wet prep today.  Will send 1 tab of Diflucan.  Patient has had multiple yeast infections this year.  Can consider chronic medication should patient desire, though she has poor compliance with medications.

## 2018-11-09 NOTE — Assessment & Plan Note (Addendum)
Procedure note above 

## 2018-11-11 LAB — CERVICOVAGINAL ANCILLARY ONLY
Chlamydia: POSITIVE — AB
Neisseria Gonorrhea: NEGATIVE

## 2018-11-12 ENCOUNTER — Telehealth: Payer: Self-pay

## 2018-11-12 NOTE — Telephone Encounter (Signed)
Patient called nurse line requesting results from last week. Informed patient is positive for Chlamydia. I scheduled her a nurse visit for treatment on Friday.

## 2018-11-13 ENCOUNTER — Ambulatory Visit: Payer: Medicaid Other

## 2018-11-13 NOTE — Telephone Encounter (Signed)
+   Chlamydia test.  Patient to f/u at nurse visit for 1g azithromycin.

## 2018-11-14 ENCOUNTER — Ambulatory Visit: Payer: Medicaid Other

## 2018-11-18 ENCOUNTER — Ambulatory Visit: Payer: Medicaid Other

## 2018-11-18 MED ORDER — ETONOGESTREL 68 MG ~~LOC~~ IMPL
68.0000 mg | DRUG_IMPLANT | Freq: Once | SUBCUTANEOUS | Status: AC
  Administered 2018-11-06: 12:00:00 68 mg via SUBCUTANEOUS

## 2018-11-18 NOTE — Addendum Note (Signed)
Addended by: Christen Bame D on: 11/18/2018 12:34 PM   Modules accepted: Orders

## 2018-12-03 ENCOUNTER — Ambulatory Visit: Payer: Medicaid Other | Admitting: Family Medicine

## 2018-12-05 ENCOUNTER — Ambulatory Visit: Payer: Self-pay | Admitting: Family Medicine

## 2018-12-08 ENCOUNTER — Ambulatory Visit: Payer: Self-pay | Admitting: Family Medicine

## 2018-12-16 ENCOUNTER — Encounter: Payer: Self-pay | Admitting: Family Medicine

## 2018-12-16 ENCOUNTER — Other Ambulatory Visit: Payer: Self-pay

## 2018-12-16 ENCOUNTER — Telehealth: Payer: Self-pay

## 2018-12-16 ENCOUNTER — Ambulatory Visit (INDEPENDENT_AMBULATORY_CARE_PROVIDER_SITE_OTHER): Payer: Self-pay | Admitting: Family Medicine

## 2018-12-16 ENCOUNTER — Other Ambulatory Visit (HOSPITAL_COMMUNITY)
Admission: RE | Admit: 2018-12-16 | Discharge: 2018-12-16 | Disposition: A | Discharge status: Home / Self Care | Payer: Medicaid Other | Source: Ambulatory Visit | Attending: Family Medicine | Admitting: Family Medicine

## 2018-12-16 VITALS — BP 98/62 | HR 73

## 2018-12-16 DIAGNOSIS — A749 Chlamydial infection, unspecified: Secondary | ICD-10-CM

## 2018-12-16 DIAGNOSIS — Z202 Contact with and (suspected) exposure to infections with a predominantly sexual mode of transmission: Secondary | ICD-10-CM | POA: Insufficient documentation

## 2018-12-16 DIAGNOSIS — N939 Abnormal uterine and vaginal bleeding, unspecified: Secondary | ICD-10-CM

## 2018-12-16 DIAGNOSIS — Z3202 Encounter for pregnancy test, result negative: Secondary | ICD-10-CM

## 2018-12-16 DIAGNOSIS — N926 Irregular menstruation, unspecified: Secondary | ICD-10-CM

## 2018-12-16 LAB — POCT HEMOGLOBIN: Hemoglobin: 12.2 g/dL (ref 11–14.6)

## 2018-12-16 LAB — POCT WET PREP (WET MOUNT)
Clue Cells Wet Prep Whiff POC: NEGATIVE
Trichomonas Wet Prep HPF POC: ABSENT

## 2018-12-16 LAB — POCT URINE PREGNANCY: Preg Test, Ur: NEGATIVE

## 2018-12-16 MED ORDER — AZITHROMYCIN 500 MG PO TABS: 1000.0000 mg | ORAL_TABLET | Freq: Once | ORAL | Status: DC

## 2018-12-16 MED ORDER — NORETHINDRONE ACET-ETHINYL EST 1.5-30 MG-MCG PO TABS: 1.0000 | ORAL_TABLET | Freq: Every day | ORAL | 2 refills | Status: DC

## 2018-12-16 NOTE — Telephone Encounter (Signed)
Informed patient of results.  .Yamilex Borgwardt R Sanya Kobrin, CMA  

## 2018-12-16 NOTE — Patient Instructions (Signed)
Chlamydia, Female  Chlamydia is a STD (sexually transmitted disease). This is an infection that spreads through sexual contact. If it is not treated, it can cause serious problems. It must be treated with antibiotic medicine. If this infection is not treated and you are pregnant or become pregnant, your baby could get it during delivery. This may cause bad health problems for the baby. Sometimes, you may not have symptoms (asymptomatic). When you have symptoms, they can include:  Burning when you pee (urinate).  Peeing often.  Fluid (discharge) coming from the vagina.  Redness, soreness, and swelling (inflammation) of the butt (rectum).  Bleeding or fluid coming from the butt.  Belly (abdominal) pain.  Pain during sex.  Bleeding between periods.  Itching, burning, or redness in the eyes.  Fluid coming from the eyes. Follow these instructions at home: Medicines  Take over-the-counter and prescription medicines only as told by your doctor.  Take your antibiotic medicine as told by your doctor. Do not stop taking the antibiotic even if you start to feel better. Sexual activity  Tell sex partners about your infection. Sex partners are people you had oral, anal, or vaginal sex with within 60 days of when you started getting sick. They need treatment, too.  Do not have sex until: ? You and your sex partners have been treated. ? Your doctor says it is okay.  If you have a single dose treatment, wait 7 days before having sex. General instructions  It is up to you to get your test results. Ask your doctor when your results will be ready.  Get a lot of rest.  Eat healthy foods.  Drink enough fluid to keep your pee (urine) clear or pale yellow.  Keep all follow-up visits as told by your doctor. You may need tests after 3 months. Preventing chlamydia  The only way to prevent chlamydia is not to have sex. To lower your risk: ? Use latex condoms correctly. Do this every time  you have sex. ? Avoid having many sex partners. ? Ask if your partner has been tested for STDs and if he or she had negative results. Contact a doctor if:  You get new symptoms.  You do not get better with treatment.  You have a fever or chills.  You have pain during sex. Get help right away if:  Your pain gets worse and does not get better with medicine.  You get flu-like symptoms, such as: ? Night sweats. ? Sore throat. ? Muscle aches.  You feel sick to your stomach (nauseous).  You throw up (vomit).  You have trouble swallowing.  You have bleeding: ? Between periods. ? After sex.  You have irregular periods.  You have belly pain that does not get better with medicine.  You have lower back pain that does not get better with medicine.  You feel weak or dizzy.  You pass out (faint).  You are pregnant and you get symptoms of chlamydia. Summary  Chlamydia is an infection that spreads through sexual contact.  Sometimes, chlamydia can cause no symptoms (asymptomatic).  Do not have sex until your doctor says it is okay.  All sex partners will have to be treated for chlamydia. This information is not intended to replace advice given to you by your health care provider. Make sure you discuss any questions you have with your health care provider. Document Released: 01/31/2008 Document Revised: 10/15/2017 Document Reviewed: 04/12/2016 Elsevier Patient Education  2020 Elsevier Inc.  

## 2018-12-16 NOTE — Assessment & Plan Note (Signed)
Patient with 1 month of abnormal uterine bleeding.  Likely secondary to her new Nexplanon.  Discussed with patient that this is a common side effect with Nexplanon use.  Urine pregnancy test was negative.  Will obtain hemoglobin to ensure no anemia, vital signs are stable and patient denies any symptoms at this time.  Patient is only needing to use light pads at this time.  I think patient would benefit from OCP use to have estrogen to help control her bleeding.  Will prescribe Junel as this will also help iron.  Patient would like to keep her Nexplanon in otherwise.  Strict return precautions given.  Follow-up in 1 month. -Junel birth control pills -Follow-up in 1 month to ensure cessation of bleeding -Pregnancy test negative -We will obtain hemoglobin

## 2018-12-16 NOTE — Progress Notes (Signed)
Subjective:    Patient ID: Erin Wilkins, female    DOB: 1995/06/03, 23 y.o.   MRN: [id number]   CC:  HPI: AUB Patient reports abnormal bleeding since having Nexplanon placed.  Patient had Nexplanon re-insertion on 11/06/2018.  States that since then she has had a heavy, dark blood.  States that it did go away for a little but then came back.  Has been happening daily.  States that she has to change her pads every 2 hours, but they are light pads that she does not like the heavy pads.  States that 3 days ago she had clots but is not otherwise having large clots.  Has not been sexually active since having Nexplanon and is she is bleeding.  States that this has happened with her previous Nexplanon as well.  At that time she was given Sprintec which helped, but she says Sprintec made her nauseous so she would like a different form of birth control if that is going to be prescribed today.   Chlamydia Patient with positive chlamydia test on 11/06/18. Does not appear to have had treatment.  States that she was out of town and was not back till now so was hoping to get treatment today.  Patient denies any further STD exposure.  Does have an allergy to ceftriaxone, states that she breaks out in hives.   Objective:  BP 98/62   Pulse 73   SpO2 100%  Vitals and nursing note reviewed  General: well nourished, in no acute distress HEENT: normocephalic, no scleral icterus or conjunctival pallor Cardiac: RRR, clear S1 and S2, no murmurs, rubs, or gallops Respiratory: clear to auscultation bilaterally, no increased work of breathing Abdomen: soft, nontender, nondistended, no masses or organomegaly. Bowel sounds present Extremities: no edema or cyanosis. Warm, well perfused.  Skin: warm and dry, no rashes noted Neuro: alert and oriented, no focal deficits Female genitalia: normal external genitalia, vulva, vagina, cervix, uterus and adnexa. Bleeding noted   Assessment & Plan:    Abnormal uterine  bleeding (AUB) Patient with 1 month of abnormal uterine bleeding.  Likely secondary to her new Nexplanon.  Discussed with patient that this is a common side effect with Nexplanon use.  Urine pregnancy test was negative.  Will obtain hemoglobin to ensure no anemia, vital signs are stable and patient denies any symptoms at this time.  Patient is only needing to use light pads at this time.  I think patient would benefit from OCP use to have estrogen to help control her bleeding.  Will prescribe Junel as this will also help iron.  Patient would like to keep her Nexplanon in otherwise.  Strict return precautions given.  Follow-up in 1 month. -Junel birth control pills -Follow-up in 1 month to ensure cessation of bleeding -Pregnancy test negative -We will obtain hemoglobin   Chlamydia infection Patient with positive Chlamydia test at last appointment but was unable to get treatment.  Will give azithromycin 1 g today.  Counseled on safe sex practices.  Will obtain HIV, RPR, hepatitis C testing today as well.  Patient is allergic to ceftriaxone with an allergic reaction of hives.  Discussed with infectious disease, Dr. Daiva Eves, who recommended if gonorrhea is positive patient needs to be treated with gentamicin 240 IM +2 g azithromycin.  Patient may need to go to health department or hospital to have this treatment as we do not have gentamicin here.  Discussed this with patient who is agreeable to plan.  Strict return  precautions given.  Discussed possibility of starting prep therapy if patient is high risk per the recommendations of Dr. Daiva Eves.  Patient states if she decides to start prep therapy she will make an appointment with the infectious disease specialist at [facility].    Return in about 4 weeks (around 01/13/2019).   Oralia Manis, DO, PGY-3

## 2018-12-16 NOTE — Assessment & Plan Note (Signed)
Patient with positive Chlamydia test at last appointment but was unable to get treatment.  Will give azithromycin 1 g today.  Counseled on safe sex practices.  Will obtain HIV, RPR, hepatitis C testing today as well.  Patient is allergic to ceftriaxone with an allergic reaction of hives.  Discussed with infectious disease, Dr. Tommy Medal, who recommended if gonorrhea is positive patient needs to be treated with gentamicin 240 IM +2 g azithromycin.  Patient may need to go to health department or hospital to have this treatment as we do not have gentamicin here.  Discussed this with patient who is agreeable to plan.  Strict return precautions given.  Discussed possibility of starting prep therapy if patient is high risk per the recommendations of Dr. Tommy Medal.  Patient states if she decides to start prep therapy she will make an appointment with the infectious disease specialist at [facility].

## 2018-12-17 ENCOUNTER — Encounter: Payer: Self-pay | Admitting: Family Medicine

## 2018-12-17 LAB — HIV ANTIBODY (ROUTINE TESTING W REFLEX): HIV Screen 4th Generation wRfx: NONREACTIVE

## 2018-12-17 LAB — CERVICOVAGINAL ANCILLARY ONLY
Chlamydia: POSITIVE — AB
Neisseria Gonorrhea: NEGATIVE

## 2018-12-17 LAB — RPR: RPR Ser Ql: NONREACTIVE

## 2018-12-17 LAB — HEPATITIS C ANTIBODY: Hep C Virus Ab: <0.1 s/co ratio (ref 0.0–0.9)

## 2018-12-29 ENCOUNTER — Other Ambulatory Visit: Payer: Self-pay

## 2018-12-29 ENCOUNTER — Telehealth (INDEPENDENT_AMBULATORY_CARE_PROVIDER_SITE_OTHER): Payer: Self-pay | Admitting: Family Medicine

## 2018-12-29 DIAGNOSIS — Z20822 Contact with and (suspected) exposure to covid-19: Secondary | ICD-10-CM

## 2018-12-29 DIAGNOSIS — R6889 Other general symptoms and signs: Secondary | ICD-10-CM

## 2018-12-29 NOTE — Progress Notes (Signed)
107#  98/62  NO FEVER  [id number] - [place], [place] - [address] AT [place] OF [address]  PT GAVE CONSENT TO TELEPHONE VISIT. Salvatore Marvel, CMA

## 2018-12-29 NOTE — Progress Notes (Signed)
Virtual Visit via Video Note  I connected with Erin Wilkins on 12/29/18 at 10:10 AM EDT by a video enabled telemedicine application and verified that I am speaking with the correct person using two identifiers.  Location: Patient: at home Provider: [facility] clinic   I discussed the limitations of evaluation and management by telemedicine and the availability of in person appointments. The patient expressed understanding and agreed to proceed.  History of Present Illness: Patient has had minor sore throat, scratchy throat, runny nose since last Thursday, just started having changes in taste and smell sensation yesterday.  No known confirmed exposure to a confirmed positive coronavirus patient.  Does have a child at home and would like to be tested just to be sure.   Observations/Objective: Patient is speaking clearly in full sentences with no signs of respiratory distress over the phone.   Assessment and Plan: Drive-through testing ordered at [facility].  Patient will be called with results when they come back in 2 to 4 days.  Patient instructed that if these test is positive, that she will need to be isolating for at least 14 days from the start of the symptoms as well as at least 3 days symptom-free with no respiratory symptoms and no fever without the use of Tylenol.  This means that if her test is positive she will be isolating until at least September 2.  Follow Up Instructions: Patient told the treatment in case she is positive is largely self supportive and her goal should be to stay home until this self resolves, the only indication for coming in for medical treatment would be if she feels she needs emergency care in the hospital.  This would not be a clinic visit   I discussed the assessment and treatment plan with the patient. The patient was provided an opportunity to ask questions and all were answered. The patient agreed with the plan and demonstrated an understanding of the  instructions.   The patient was advised to call back or seek an in-person evaluation if the symptoms worsen or if the condition fails to improve as anticipated.  I provided 10 minutes of non-face-to-face time during this encounter.   Sherene Sires, DO  COVID Drive-Up Test Referral Criteria  Patient age: 23 y.o.  Symptoms: Cough, New loss of smell or taste and Sore throat  Underlying Conditions: No underlying conditions  Is the patient a first responder? No  Does the patient live or work in a high risk or high density environment: No  Is the patient a COVID convalescent patient who is 14-28 days symptom-free and interested in donating plasma for use as a therapeutic product? No

## 2018-12-30 LAB — NOVEL CORONAVIRUS, NAA: SARS-CoV-2, NAA: NOT DETECTED

## 2019-01-14 ENCOUNTER — Other Ambulatory Visit: Payer: Self-pay

## 2019-01-14 ENCOUNTER — Encounter: Payer: Self-pay | Admitting: Family Medicine

## 2019-01-14 ENCOUNTER — Ambulatory Visit (INDEPENDENT_AMBULATORY_CARE_PROVIDER_SITE_OTHER): Payer: Medicaid Other | Admitting: Family Medicine

## 2019-01-14 VITALS — BP 94/60 | HR 71 | Wt 107.8 lb

## 2019-01-14 DIAGNOSIS — Z30016 Encounter for initial prescription of transdermal patch hormonal contraceptive device: Secondary | ICD-10-CM

## 2019-01-14 DIAGNOSIS — Z309 Encounter for contraceptive management, unspecified: Secondary | ICD-10-CM | POA: Insufficient documentation

## 2019-01-14 DIAGNOSIS — Z3046 Encounter for surveillance of implantable subdermal contraceptive: Secondary | ICD-10-CM

## 2019-01-14 DIAGNOSIS — N926 Irregular menstruation, unspecified: Secondary | ICD-10-CM | POA: Diagnosis not present

## 2019-01-14 MED ORDER — NORELGESTROMIN-ETH ESTRADIOL 150-35 MCG/24HR TD PTWK: 1.0000 | MEDICATED_PATCH | TRANSDERMAL | 1 refills | Status: DC

## 2019-01-14 NOTE — Progress Notes (Signed)
   Acute Visit  PCP: Wilber Oliphant, MD Subjective  CC: Nexplanon removal  BPZ:WCHE Erin Wilkins is a 23 y.o. female who presents today with the following problems: Abnormal bleeding Patient reports today because she has had consistent bleeding since getting her Nexplanon placed in July 2020.  She reports that it is a nuisance.  She does not mind the bleeding part, but she does get bothered by the inconsistency.  She denies large volume blood loss or any lightheadedness.  ROS: Pertinent ROS included in HPI. Past Medical History: Multiple vaginal infections, STDs Medications, allergies, medical history, family history and social history were reviewed and edited as necessary.  Social Hx: [place] reports that she has never smoked. She has never used smokeless tobacco. She reports that she does not drink alcohol or use drugs.    Objective   Physical Exam:  BP 94/60   Pulse 71   Wt 107 lb 12.8 oz (48.9 kg)   SpO2 100%   BMI 20.37 kg/m    General: appears well, NAD.  Psych: good judgement and insight.     Assessment  Problem List    Contraception management   Current Assessment & Plan    Had a long discussion with patient about birth control options.  Discussed her risk of bleeding with any birth control that she starts as this is the biggest nuisance to her at this time. She doesn't mind periods, she really dislikes the irregularity. We discussed other options for contraception. Decided that pills are not good for her as they have failed in the past and patient is not able to take them at the same time everyday. Patient does not want IUD. Spoke to patient about patch which she thinks she can remember to place weekly. Provided handout for return precautions and side effects.    Patient to follow up in a few months if she would like to go back to the nexplanon. She does understand that if she were to get the nexplanon replaced, she would likely have to wait another 6 months before her periods were  regular.       Irregular uterine bleeding   Nexplanon removal   RESOLVED: Abnormal uterine bleeding (AUB)   RESOLVED: Allergic rhinitis   RESOLVED: Chlamydia infection   RESOLVED: Eczema   RESOLVED: Nexplanon insertion   RESOLVED: Patella alta   RESOLVED: Routine general medical examination at a health care facility   RESOLVED: Trichomonal vaginitis   RESOLVED: UTI symptoms   RESOLVED: Yeast infection involving the vagina and surrounding area   RESOLVED: Yeast vaginitis      Follow-up: Return if problem recurs,  worsens, or new problem develops.    Wilber Oliphant, M.D.  PGY-2  Family Medicine  [phone] 01/15/2019 3:34 PM

## 2019-01-14 NOTE — Patient Instructions (Addendum)
Dear Erin Wilkins,   It was good to see you! Thank you for taking your time to come in to be seen. Today, we discussed the following:   Nexplanon Removal    You can take dressing off in 4 hours. If it starts to bleed, hold pressure on the area and place bandaid.   The Patch   Sent to your pharmacy. Use as directed   Place patch once a week for three weeks. Then on the fourth week, no patch to allow your period. Then restart again the next month   Please follow up in 2-3 months or sooner for concerning or worsening symptoms.   Be well,   Zettie Cooley, M.D   [facility] [phone]  *Sign up for MyChart for instant access to your health profile, labs, orders, upcoming appointments or to contact your provider with questions*  =================================================================================== Ethinyl Estradiol; Norelgestromin skin patches What is this medicine? ETHINYL ESTRADIOL;NORELGESTROMIN (ETH in il es tra DYE ole; nor el JES troe min) skin patch is used as a contraceptive (birth control method). This medicine combines two types of female hormones, an estrogen and a progestin. This patch is used to prevent ovulation and pregnancy. This medicine may be used for other purposes; ask your health care provider or pharmacist if you have questions. COMMON BRAND NAME(S): Ortho Becky Sax What should I tell my health care provider before I take this medicine? They need to know if you have or ever had any of these conditions:  abnormal vaginal bleeding  blood vessel disease or blood clots  breast, cervical, endometrial, ovarian, liver, or uterine cancer  diabetes  gallbladder disease  having surgery  heart disease or recent heart attack  high blood pressure  high cholesterol or triglycerides  history of irregular heartbeat or heart valve problems  kidney disease  liver disease  migraine headaches  protein C deficiency  protein S  deficiency  recently had a baby, miscarriage, or abortion  stroke  systemic lupus erythematosus (SLE)  tobacco smoker  an unusual or allergic reaction to estrogens, progestins, other medicines, foods, dyes, or preservatives  pregnant or trying to get pregnant  breast-feeding How should I use this medicine? This patch is applied to the skin. Follow the directions on the prescription label. Apply to clean, dry, healthy skin on the buttock, abdomen, upper outer arm or upper torso, in a place where it will not be rubbed by tight clothing. Do not use lotions or other cosmetics on the site where the patch will go. Press the patch firmly in place for 10 seconds to ensure good contact with the skin. Change the patch every 7 days on the same day of the week for 3 weeks. You will then have a break from the patch for 1 week, after which you will apply a new patch. Do not use your medicine more often than directed. Contact your pediatrician regarding the use of this medicine in children. Special care may be needed. This medicine has been used in female children who have started having menstrual periods. A patient package insert for the product will be given with each prescription and refill. Read this sheet carefully each time. The sheet may change frequently. Overdosage: If you think you have taken too much of this medicine contact a poison control center or emergency room at once. NOTE: This medicine is only for you. Do not share this medicine with others. What if I miss a dose? You will need to replace your  patch once a week as directed. If your patch is lost or falls off, contact your health care professional for advice. You may need to use another form of birth control if your patch has been off for more than 1 day. What may interact with this medicine? Do not take this medicine with the following medications:  dasabuvir; ombitasvir; paritaprevir; ritonavir  ombitasvir; paritaprevir;  ritonavir This medicine may also interact with the following medications:  acetaminophen  antibiotics or medicines for infections, especially rifampin, rifabutin, rifapentine, and possibly penicillins or tetracyclines  aprepitant or fosaprepitant  armodafinil  ascorbic acid (vitamin C)  barbiturate medicines, such as phenobarbital or primidone  bosentan  certain antiviral medicines for hepatitis, HIV or AIDS  certain medicines for cancer treatment  certain medicines for seizures like carbamazepine, clobazam, felbamate, lamotrigine, oxcarbazepine, phenytoin, rufinamide, topiramate  certain medicines for treating high cholesterol  cyclosporine  dantrolene  elagolix  flibanserin  grapefruit juice  lesinurad  medicines for diabetes  medicines to treat fungal infections, such as griseofulvin, miconazole, fluconazole, ketoconazole, itraconazole, posaconazole or voriconazole  mifepristone  mitotane  modafinil  morphine  mycophenolate  St. John's wort  tamoxifen  temazepam  theophylline or aminophylline  thyroid hormones  tizanidine  tranexamic acid  ulipristal  warfarin This list may not describe all possible interactions. Give your health care provider a list of all the medicines, herbs, non-prescription drugs, or dietary supplements you use. Also tell them if you smoke, drink alcohol, or use illegal drugs. Some items may interact with your medicine. What should I watch for while using this medicine? Visit your doctor or health care professional for regular checks on your progress. You will need a regular breast and pelvic exam and Pap smear while on this medicine. Use an additional method of contraception during the first cycle that you use this patch. If you have any reason to think you are pregnant, stop using this medicine right away and contact your doctor or health care professional. If you are using this medicine for hormone related problems,  it may take several cycles of use to see improvement in your condition. Smoking increases the risk of getting a blood clot or having a stroke while you are using hormonal birth control, especially if you are more than 23 years old. You are strongly advised not to smoke. This medicine can make your body retain fluid, making your fingers, hands, or ankles swell. Your blood pressure can go up. Contact your doctor or health care professional if you feel you are retaining fluid. This medicine can make you more sensitive to the sun. Keep out of the sun. If you cannot avoid being in the sun, wear protective clothing and use sunscreen. Do not use sun lamps or tanning beds/booths. If you wear contact lenses and notice visual changes, or if the lenses begin to feel uncomfortable, consult your eye care specialist. In some women, tenderness, swelling, or minor bleeding of the gums may occur. Notify your dentist if this happens. Brushing and flossing your teeth regularly may help limit this. See your dentist regularly and inform your dentist of the medicines you are taking. If you are going to have elective surgery or a MRI, you may need to stop using this medicine before the surgery or MRI. Consult your health care professional for advice. This medicine does not protect you against HIV infection (AIDS) or any other sexually transmitted diseases. What side effects may I notice from receiving this medicine? Side effects that you should report  to your doctor or health care professional as soon as possible:  allergic reactions such as skin rash or itching, hives, swelling of the lips, mouth, tongue, or throat  breast tissue changes or discharge  dark patches of skin on your forehead, cheeks, upper lip, and chin  depression  high blood pressure  migraines or severe, sudden headaches  missed menstrual periods  signs and symptoms of a blood clot such as breathing problems; changes in vision; chest pain;  severe, sudden headache; pain, swelling, warmth in the leg; trouble speaking; sudden numbness or weakness of the face, arm or leg  skin reactions at the patch site such as blistering, bleeding, itching, rash, or swelling  stomach pain  yellowing of the eyes or skin Side effects that usually do not require medical attention (report these to your doctor or health care professional if they continue or are bothersome):  breast tenderness  irregular vaginal bleeding or spotting, particularly during the first 3 months of use  headache  nausea  painful menstrual periods  skin redness or mild irritation at site where applied  weight gain (slight) This list may not describe all possible side effects. Call your doctor for medical advice about side effects. You may report side effects to FDA at 1-800-FDA-1088. Where should I keep my medicine? Keep out of the reach of children. Store at room temperature between 15 and 30 degrees C (59 and 86 degrees F). Keep the patch in its pouch until time of use. Throw away any unused medicine after the expiration date. Dispose of used patches properly. Since a used patch may still contain active hormones, fold the patch in half so that it sticks to itself prior to disposal. Throw away in a place where children or pets cannot reach. NOTE: This sheet is a summary. It may not cover all possible information. If you have questions about this medicine, talk to your doctor, pharmacist, or health care provider.  2020 Elsevier/Gold Standard (2018-07-29 11:56:29)

## 2019-01-14 NOTE — Progress Notes (Signed)
Nexplanon Removal Procedure Note PRE-OP DIAGNOSIS: Nexplanon, desire for change of contraception   POST-OP DIAGNOSIS: Same   PROCEDURE: Nexplanon Removal   Performing Physician: Zettie Cooley, MD  PROCEDURE:  Anesthesia: 2% Lidocaine w/ epinephrine 5 ml   Procedure: Consent obtained. A time-out was performed prior to initiating procedure to be sure of right patient and right location. The area surrounding the Nexplanon was prepared with Choloraprep and draped in the usual sterile manner. The site was anesthetized with lidocaine. A skin incision was made over the distal aspect of the device. The capsule lysed sharply and the device removed using a hemostat. Hemostasis was assured. The site was dressed with bandage and a pressure dressing. The patient tolerated the procedure well.   Followup: The patient tolerated the procedure well without complications. Standard post-procedure care is explained and return precautions are given. Contraception is advised until conception is desired.

## 2019-01-14 NOTE — Assessment & Plan Note (Addendum)
Had a long discussion with patient about birth control options.  Discussed her risk of bleeding with any birth control that she starts as this is the biggest nuisance to her at this time. She doesn't mind periods, she really dislikes the irregularity. We discussed other options for contraception. Decided that pills are not good for her as they have failed in the past and patient is not able to take them at the same time everyday. Patient does not want IUD. Spoke to patient about patch which she thinks she can remember to place weekly. Provided handout for return precautions and side effects.    Patient to follow up in a few months if she would like to go back to the nexplanon. She does understand that if she were to get the nexplanon replaced, she would likely have to wait another 6 months before her periods were regular.

## 2019-01-15 ENCOUNTER — Encounter: Payer: Self-pay | Admitting: Family Medicine

## 2019-02-25 ENCOUNTER — Ambulatory Visit: Payer: Medicaid Other

## 2019-02-26 ENCOUNTER — Other Ambulatory Visit: Payer: Self-pay

## 2019-02-26 ENCOUNTER — Other Ambulatory Visit (HOSPITAL_COMMUNITY)
Admission: RE | Admit: 2019-02-26 | Discharge: 2019-02-26 | Disposition: A | Discharge status: Home / Self Care | Payer: Medicaid Other | Source: Ambulatory Visit | Attending: Family Medicine | Admitting: Family Medicine

## 2019-02-26 ENCOUNTER — Ambulatory Visit (INDEPENDENT_AMBULATORY_CARE_PROVIDER_SITE_OTHER): Payer: Self-pay | Admitting: Family Medicine

## 2019-02-26 VITALS — BP 110/60 | HR 81 | Ht 61.0 in | Wt 108.2 lb

## 2019-02-26 DIAGNOSIS — N898 Other specified noninflammatory disorders of vagina: Secondary | ICD-10-CM

## 2019-02-26 LAB — POCT WET PREP (WET MOUNT)
Clue Cells Wet Prep Whiff POC: NEGATIVE
Trichomonas Wet Prep HPF POC: ABSENT
WBC, Wet Prep HPF POC: >20

## 2019-02-26 MED ORDER — FLUCONAZOLE 150 MG PO TABS: ORAL_TABLET | ORAL | 0 refills | Status: DC

## 2019-02-26 NOTE — Patient Instructions (Signed)
Thank you for coming to see me today. It was a pleasure! Today we talked about:   We will release your results on MyChart.  If there is any treatment that you need we will call this in for you.  I do recommend that you consider using a contraception that we discussed such as IUD or the pill.    Please follow-up with Dr. Maudie Mercury regarding your choice and if you decide to go with the IUD please call ahead to let us know so that we can put this in place for you..  If you have any questions or concerns, please do not hesitate to call the office at [phone].  Take Care,   [place] Makari Portman, DO  Intrauterine Device Information An intrauterine device (IUD) is a medical device that is inserted in the uterus to prevent pregnancy. It is a small, T-shaped device that has one or two nylon strings hanging down from it. The strings hang out of the lower part of the uterus (cervix) to allow for future IUD removal. There are two types of IUDs available:  Hormone IUD. This type of IUD is made of plastic and contains the hormone progestin (synthetic progesterone). A hormone IUD may last 3-5 years.  Copper IUD. This type of IUD has copper wire wrapped around it. A copper IUD may last up to 10 years. How is an IUD inserted? An IUD is inserted through the vagina and placed into the uterus with a minor medical procedure. The exact procedure for IUD insertion may vary among health care providers and hospitals. How does an IUD work? Synthetic progesterone in a hormonal IUD prevents pregnancy by:  Thickening cervical mucus to prevent sperm from entering the uterus.  Thinning the uterine lining to prevent a fertilized egg from being implanted there. Copper in a copper IUD prevents pregnancy by making the uterus and fallopian tubes produce a fluid that kills sperm. What are the advantages of an IUD? Advantages of either type of IUD  It is highly effective in preventing pregnancy.  It is reversible. You can  become pregnant shortly after the IUD is removed.  It is low-maintenance and can stay in place for a long time.  There are no estrogen-related side effects.  It can be used when breastfeeding.  It is not associated with weight gain.  It can be inserted right after childbirth, an abortion, or a miscarriage. Advantages of a hormone IUD  If it is inserted within 7 days of your period starting, it works right after it is inserted. If the hormone IUD is inserted at any other time in your cycle, you will need to use a backup method of birth control for 7 days after insertion.  It can make menstrual periods lighter.  It can reduce menstrual cramping.  It can be used for 3-5 years. Advantages of a copper IUD  It works right after it is inserted.  It can be used as a form of emergency birth control if it is inserted within 5 days after having unprotected sex.  It does not interfere with your body's natural hormones.  It can be used for 10 years. What are the disadvantages of an IUD?  An IUD may cause irregular menstrual bleeding for a period of time after insertion.  You may have pain during insertion and have cramping and vaginal bleeding after insertion.  An IUD may cut the uterus (uterine perforation) when it is inserted. This is rare.  An IUD may cause pelvic  inflammatory disease (PID), which is an infection in the uterus and fallopian tubes. This is rare, and it usually happens during the first 20 days after the IUD is inserted.  A copper IUD can make your menstrual flow heavier and more painful. How is an IUD removed?  You will lie on your back with your knees bent and your feet in footrests (stirrups).  A device will be inserted into your vagina to spread apart the vaginal walls (speculum). This will allow your health care provider to see the strings attached to the IUD.  Your health care provider will use a small instrument (forceps) to grasp the IUD strings and pull  firmly until the IUD is removed. You may have some discomfort when the IUD is removed. Your health care provider may recommend taking over-the-counter pain relievers, such as ibuprofen, before the procedure. You may also have minor spotting for a few days after the procedure. The exact procedure for IUD removal may vary among health care providers and hospitals. Is the IUD right for me? Your health care provider will make sure you are a good candidate for an IUD and will discuss the advantages, disadvantages, and possible side effects with you. Summary  An intrauterine device (IUD) is a medical device that is inserted in the uterus to prevent pregnancy. It is a small, T-shaped device that has one or two nylon strings hanging down from it.  A hormone IUD contains the hormone progestin (synthetic progesterone). A copper IUD has copper wire wrapped around it.  Synthetic progesterone in a hormone IUD prevents pregnancy by thickening cervical mucus and thinning the walls of the uterus. Copper in a copper IUD prevents pregnancy by making the uterus and fallopian tubes produce a fluid that kills sperm.  A hormone IUD can be left in place for 3-5 years. A copper IUD can be left in place for up to 10 years.  An IUD is inserted and removed by a health care provider. You may feel some pain during insertion and removal. Your health care provider may recommend taking over-the-counter pain medicine, such as ibuprofen, before an IUD procedure. This information is not intended to replace advice given to you by your health care provider. Make sure you discuss any questions you have with your health care provider. Document Released: 03/27/2004 Document Revised: 04/05/2017 Document Reviewed: 05/22/2016 Elsevier Patient Education  2020 [employer].

## 2019-02-26 NOTE — Progress Notes (Signed)
Subjective:  Patient ID: Erin Wilkins  DOB: 08/17/1995 MRN: [id number]  Erin Wilkins is a 23 y.o. female with no significant past medical history here today for vaginal discharge.   HPI:  Vaginal Discharge - has been ongoing for 7 days  - Denies itching, burning, abdominal pain, nausea or vomiting. - Discharge described as thick, white.  - 1-2 months ago had trich and was treated, chlamydia also treated - LMP was on 02/02/2019.  - Denies burning with urination, no hematuria.     ROS: As mentioned in HPI  Social hx: Denies use of illicit drugs, alcohol use Smoking status reviewed  Patient Active Problem List   Diagnosis Date Noted  . Nexplanon removal 11/09/2018  . Vaginal discharge 04/24/2016     Objective:  BP 110/60   Pulse 81   Ht 5\' 1"  (1.549 m)   Wt 108 lb 4 oz (49.1 kg)   LMP 02/02/2019   SpO2 97%   BMI 20.45 kg/m   Vitals and nursing note reviewed  General: NAD, pleasant Pulm: normal effort GU/GYN: External genitalia within normal limits.  Vaginal mucosa pink, moist, normal rugae.  Nonfriable cervix without lesions; thick white and yellow discharge, no bleeding noted on speculum exam. Exam performed in the presence of a chaperone. Extremities: no edema or cyanosis. WWP. Skin: warm and dry, no rashes noted Neuro: alert and oriented, no focal deficits Psych: normal affect, normal thought content  Assessment & Plan:   Vaginal discharge Patient with copious amounts of vaginal discharge on exam, recently treated chlamydial infection on 12/17/2018. Yeast confirmed on wet prep. G/C [name] is pending. Symptoms consistent with this.  -Diflucan x1 today and x1 in 3 days - F/U if symptoms not improving or getting worse.  - Will f/u on G/C Chlamydia and call in Rx if positive.  - F/U with PCP as needed.  - Return precautions including abdominal pain, fever, chills, nausea, or vomiting given.    Patient also showing interest in using IUD versus pill for contraception  and will discuss this with Dr. Selena Batten at next visit.  She is using condoms in the meantime.  [place] Joeangel Jeanpaul, DO Family Medicine Resident PGY-3

## 2019-02-26 NOTE — Assessment & Plan Note (Addendum)
Patient with copious amounts of vaginal discharge on exam, recently treated chlamydial infection on 12/17/2018. Yeast confirmed on wet prep. G/C Marveen Reeks is pending. Symptoms consistent with this.  -Diflucan x1 today and x1 in 3 days - F/U if symptoms not improving or getting worse.  - Will f/u on G/C Chlamydia and call in Rx if positive.  - F/U with PCP as needed.  - Return precautions including abdominal pain, fever, chills, nausea, or vomiting given.

## 2019-02-27 LAB — HIV ANTIBODY (ROUTINE TESTING W REFLEX): HIV Screen 4th Generation wRfx: NONREACTIVE

## 2019-02-27 LAB — RPR: RPR Ser Ql: NONREACTIVE

## 2019-03-03 LAB — CERVICOVAGINAL ANCILLARY ONLY
Chlamydia: NEGATIVE
Comment: NEGATIVE
Comment: NEGATIVE
Comment: NORMAL
Neisseria Gonorrhea: NEGATIVE
Trichomonas: NEGATIVE

## 2019-03-10 ENCOUNTER — Ambulatory Visit: Payer: Medicaid Other | Admitting: Family Medicine

## 2019-04-10 ENCOUNTER — Encounter: Payer: Self-pay | Admitting: Family Medicine

## 2019-04-12 ENCOUNTER — Encounter: Payer: Self-pay | Admitting: Family Medicine

## 2019-04-12 NOTE — Telephone Encounter (Signed)
Attempted to call patient to ask about symptoms. LVM to send another MyChart message about what her symptoms are so that we are treating safely. Replied to MyChart message as well.

## 2019-04-13 ENCOUNTER — Ambulatory Visit: Payer: Medicaid Other

## 2019-06-24 ENCOUNTER — Ambulatory Visit (INDEPENDENT_AMBULATORY_CARE_PROVIDER_SITE_OTHER): Payer: Self-pay | Admitting: Family Medicine

## 2019-06-24 ENCOUNTER — Encounter: Payer: Self-pay | Admitting: Family Medicine

## 2019-06-24 ENCOUNTER — Other Ambulatory Visit (HOSPITAL_COMMUNITY)
Admission: RE | Admit: 2019-06-24 | Discharge: 2019-06-24 | Disposition: A | Discharge status: Home / Self Care | Payer: Medicaid Other | Source: Ambulatory Visit | Attending: Family Medicine | Admitting: Family Medicine

## 2019-06-24 ENCOUNTER — Other Ambulatory Visit: Payer: Self-pay

## 2019-06-24 VITALS — BP 94/60 | HR 77 | Wt 104.0 lb

## 2019-06-24 DIAGNOSIS — Z202 Contact with and (suspected) exposure to infections with a predominantly sexual mode of transmission: Secondary | ICD-10-CM | POA: Insufficient documentation

## 2019-06-24 LAB — POCT WET PREP (WET MOUNT)
Clue Cells Wet Prep Whiff POC: NEGATIVE
Trichomonas Wet Prep HPF POC: ABSENT

## 2019-06-24 NOTE — Assessment & Plan Note (Signed)
Patient reports no symptoms of hematuria, dysuria, dyspareunia, abnormal vaginal discharge.  She reports that her significant other was positive for an STD in October and she was tested and treated.  Does not think she has had any recent new exposures to STDs and she has been sexually active only with her significant other.  She does not use protection. -Wet prep -Gonorrhea and Chlamydia test -HIV test -RPR test -Patient is scheduled for appointment with colposcopy clinic on 1/25 for IUD placement -Counseled patient on safe sex practices

## 2019-06-24 NOTE — Patient Instructions (Signed)
It was a pleasure to meet you today.  We did a full STD check including gonorrhea, chlamydia, bacterial vaginosis, trichomonas, HIV, RPR.  All of those results will take time to come back.  When they do come back they will go to your MyChart.  If there are any abnormal results I will call you with those.  I have scheduled you for an appointment next week on 07/02/2019 at 10 AM for an IUD insertion.  Below is information on IUD.  If you have any questions or concerns please reach out and we will be happy to answer them.   Intrauterine Device Insertion An intrauterine device (IUD) is a medical device that gets inserted into the uterus to prevent pregnancy. It is a small, T-shaped device that has one or two nylon strings hanging down from it. The strings hang out of the lower part of the uterus (cervix) to allow for future IUD removal. There are two types of IUDs available:  Copper IUD. This type of IUD has copper wire wrapped around it. Copper makes the uterus and fallopian tubes produce a fluid that kills sperm. A copper IUD may last up to 10 years.  Hormone IUD. This type of IUD is made of plastic and contains the hormone progestin (synthetic progesterone). The hormone thickens mucus in the cervix and prevents sperm from entering the uterus. It also thins the uterine lining to prevent implantation of a fertilized egg. The hormone can weaken or kill the sperm that get into the uterus. A hormone IUD may last 3-5 years. Tell a health care provider about:  Any allergies you have.  All medicines you are taking, including vitamins, herbs, eye drops, creams, and over-the-counter medicines.  Any problems you or family members have had with anesthetic medicines.  Any blood disorders you have.  Any surgeries you have had.  Any medical conditions you have, including any STIs (sexually transmitted infections) you may have.  Whether you are pregnant or may be pregnant. What are the risks? Generally, this  is a safe procedure. However, problems may occur, including:  Infection.  Bleeding.  Allergic reactions to medicines.  Accidental puncture (perforation) of the uterus, or damage to other structures or organs.  Accidental placement of the IUD either in the muscle layer of the uterus (myometrium) or outside the uterus.  The IUD falling out of the uterus (expulsion). This is more common among women who have recently had a child.  Pregnancy that happens in the fallopian tube (ectopic pregnancy).  Infection of the uterus and fallopian tubes (pelvic inflammatory disease). What happens before the procedure?  Schedule the IUD insertion for when you will have your menstrual period or right after, to make sure you are not pregnant. Placement of the IUD is better tolerated shortly after a menstrual cycle.  Follow instructions from your health care provider about eating or drinking restrictions.  Ask your health care provider about changing or stopping your regular medicines. This is especially important if you are taking diabetes medicines or blood thinners.  You may get a pain reliever to take before the procedure.  You may have tests for: ? Pregnancy. A pregnancy test involves having a urine sample taken. ? STIs. Placing an IUD in someone who has an STI can make the infection worse. ? Cervical cancer. You may have a Pap test to check for this type of cancer. This means collecting cells from your cervix to be examined under a microscope.  You may have a physical  exam to determine the size and position of your uterus. The procedure may vary among health care providers and hospitals. What happens during the procedure?  A tool (speculum) will be placed in your vagina and widened so that your health care provider can see your cervix.  Medicine may be applied to your cervix to help lower your risk of infection (antiseptic medicine).  You may be given an anesthetic medicine to numb each side  of your cervix (intracervical block or paracervical block). This medicine is usually given by an injection into the cervix.  A tool (uterine sound) will be inserted into your uterus to determine the length of your uterus and the direction that your uterus may be tilted.  A slim instrument or tube (IUD inserter) that holds the IUD will be inserted into your vagina, through your cervical canal, and into your uterus.  The IUD will be placed in the uterus, and the IUD inserter will be removed.  The strings that are attached to the IUD will be trimmed so that they lie just below the cervix. The procedure may vary among health care providers and hospitals. What happens after the procedure?  You may have bleeding after the procedure. This is normal. It varies from light bleeding (spotting) for a few days to menstrual-like bleeding.  You may have cramping and pain.  You may feel dizzy or light-headed.  You may have lower back pain. Summary  An intrauterine device (IUD) is a small, T-shaped device that has one or two nylon strings hanging down from it.  Two types of IUDs are available. You may have a copper IUD or a hormone IUD.  Schedule the IUD insertion for when you will have your menstrual period or right after, to make sure you are not pregnant. Placement of the IUD is better tolerated shortly after a menstrual cycle.  You may have bleeding after the procedure. This is normal. It varies from light spotting for a few days to menstrual-like bleeding. This information is not intended to replace advice given to you by your health care provider. Make sure you discuss any questions you have with your health care provider. Document Revised: 04/05/2017 Document Reviewed: 03/14/2016 Elsevier Patient Education  2020 [employer].

## 2019-06-24 NOTE — Progress Notes (Signed)
   CHIEF COMPLAINT / HPI: STD check Patient is here requesting to be checked for STDs.  She reports that she is sexually active with her boyfriend and that she has not been sexually active with any other males.  She does report that her significant other had an STD in October and she was checked at that time and is negative but would like to be checked again.  She denies any symptoms of dysuria, dyspareunia, hematuria, abnormal discharge.  Patient reports that she is currently on her menstrual cycle.  She reports that she does not use condoms and she has no form of contraception at this time.  She had a Nexplanon removed in October and would not like another Nexplanon.  She is interested in IUD and would like to schedule an appointment for next week.   OBJECTIVE: BP 94/60   Pulse 77   Wt 104 lb (47.2 kg)   LMP 06/21/2019 (Exact Date)   SpO2 99%   BMI 19.65 kg/m   General: Sitting comfortably on exam table during interview. Cardio: Regular rate and rhythm, no murmurs appreciated Respiratory: Normal work of breathing, clear to auscultation bilaterally, no wheezes appreciated Pelvic exam: Normal external labia, small amount of bloody mucus in vaginal vault, no abnormal discharge noted, no cervical tenderness noted no or irritation erythema noted around the cervix.  ASSESSMENT / PLAN:  Possible exposure to STD Patient reports no symptoms of hematuria, dysuria, dyspareunia, abnormal vaginal discharge.  She reports that her significant other was positive for an STD in October and she was tested and treated.  Does not think she has had any recent new exposures to STDs and she has been sexually active only with her significant other.  She does not use protection. -Wet prep -Gonorrhea and Chlamydia test -HIV test -RPR test -Patient is scheduled for appointment with colposcopy clinic on 1/25 for IUD placement -Counseled patient on safe sex practices     Derrel Nip, MD [facility]  Medicine Center

## 2019-06-25 LAB — CERVICOVAGINAL ANCILLARY ONLY
Chlamydia: POSITIVE — AB
Comment: NEGATIVE
Comment: NORMAL
Neisseria Gonorrhea: NEGATIVE

## 2019-06-25 LAB — RPR: RPR Ser Ql: NONREACTIVE

## 2019-06-25 LAB — HIV ANTIBODY (ROUTINE TESTING W REFLEX): HIV Screen 4th Generation wRfx: NONREACTIVE

## 2019-06-26 ENCOUNTER — Encounter: Payer: Self-pay | Admitting: Family Medicine

## 2019-06-26 ENCOUNTER — Telehealth: Payer: Self-pay

## 2019-06-26 ENCOUNTER — Telehealth: Payer: Self-pay | Admitting: Family Medicine

## 2019-06-26 ENCOUNTER — Ambulatory Visit: Payer: Medicaid Other

## 2019-06-26 ENCOUNTER — Telehealth (INDEPENDENT_AMBULATORY_CARE_PROVIDER_SITE_OTHER): Payer: Self-pay | Admitting: Family Medicine

## 2019-06-26 DIAGNOSIS — K219 Gastro-esophageal reflux disease without esophagitis: Secondary | ICD-10-CM | POA: Insufficient documentation

## 2019-06-26 DIAGNOSIS — A749 Chlamydial infection, unspecified: Secondary | ICD-10-CM

## 2019-06-26 MED ORDER — AZITHROMYCIN 500 MG PO TABS
1000.0000 mg | ORAL_TABLET | Freq: Once | ORAL | Status: AC
  Administered 2019-06-29: 1000 mg via ORAL

## 2019-06-26 NOTE — Telephone Encounter (Signed)
Attempted to call patient regarding her test results.  Phone call went straight to voicemail.  Patient has an appointment with Dr. Selena Batten today.  I will discuss results with her so that she can notify the patient and treat.

## 2019-06-26 NOTE — Telephone Encounter (Signed)
Called patient to speak to Dr. Selena Batten.  Patient missed her appointment today.  No answer.  LVM for patient to call office.  Glennie Hawk, CMA

## 2019-06-26 NOTE — Assessment & Plan Note (Addendum)
Patient's recent testing returned with positive chlamydia, negative gonorrhea.  Patient has been called several times by provider in office.  During telephone visit, address this issue.  Set up nurse visit for 3:00 today.  Order placed for 1 g of azithromycin.

## 2019-06-26 NOTE — Progress Notes (Signed)
[facility]icine Visit  Patient consented to have virtual visit. Method of visit: Telephone  Encounter participants: Patient: Erin Wilkins - located at home Provider: Melene Plan - located at [facility] Others (if applicable): n/a  HPI: Acid Reflux: Patient reports that she has been getting bad acid reflux after taking coffee or spaghetti.  She reports that it is not as bad if she eats more bland foods and foods without seasoning.  She has not tried anything for the acid reflux but does note that it is been getting worse.  ROS: per HPI  Pertinent PMHx: n/a  Exam:  LMP 06/21/2019 (Exact Date)   No acute distress over the phone  Assessment/Plan:  Chlamydia Patient's recent testing returned with positive chlamydia, negative gonorrhea.  Patient has been called several times by provider in office.  During telephone visit, address this issue.  Set up nurse visit for 3:00 today.  Order placed for 1 g of azithromycin.  Gastroesophageal reflux disease Patient's history and symptoms are consistent with GERD.  Counseled patient on decreasing intake of foods that exacerbate her acid reflux.  Patient can use Tums after meals if not feeling well.  If having meal that may cause GERD, she can try using H2 blocker over-the-counter 2 hours prior to meal.    Time spent during visit with patient: 9 minutes

## 2019-06-26 NOTE — Telephone Encounter (Signed)
Attempted to call patient to reschedule her nurse visit, as she missed today's apt for STD treatment. However, no answer. VML to return my call on Monday or before 5 today.

## 2019-06-26 NOTE — Assessment & Plan Note (Signed)
Patient's history and symptoms are consistent with GERD.  Counseled patient on decreasing intake of foods that exacerbate her acid reflux.  Patient can use Tums after meals if not feeling well.  If having meal that may cause GERD, she can try using H2 blocker over-the-counter 2 hours prior to meal.

## 2019-06-29 ENCOUNTER — Other Ambulatory Visit: Payer: Self-pay

## 2019-06-29 ENCOUNTER — Ambulatory Visit (INDEPENDENT_AMBULATORY_CARE_PROVIDER_SITE_OTHER): Payer: Self-pay

## 2019-06-29 ENCOUNTER — Telehealth: Payer: Self-pay

## 2019-06-29 DIAGNOSIS — A749 Chlamydial infection, unspecified: Secondary | ICD-10-CM

## 2019-06-29 MED ORDER — DOXYCYCLINE HYCLATE 100 MG PO TABS: 100.0000 mg | ORAL_TABLET | Freq: Two times a day (BID) | ORAL | 0 refills | Status: DC

## 2019-06-29 NOTE — Telephone Encounter (Signed)
Rx doxycycline 100 mg PO BID x 7 days for chlamydia infection

## 2019-06-29 NOTE — Progress Notes (Signed)
Patient presents in nurse clinic for Chlamydia treatment. Per providers orders, patient was given 1 gram Azithromycin PO. Patient tolerated well. Patient advised to eat something as soon as possible, as she has not had a meal today. Patient advised to refrain from intercourse for 7 days. Partner lives out of the area and she is unsure if he has been treated. EPT declined.   Patient rescheduled for IUD insertion for March 11th.

## 2019-06-29 NOTE — Telephone Encounter (Signed)
Patient calls nurse line regarding vomiting shortly after taking azithromycin in nurse clinic.   Consulted with preceptor (McDiarmid) and suggests patient receive alternative treatment.   Patient requests medication be sent in to the [facility] on [address] and [address].   Patient informed.  To Dr. McDiarmid  Veronda Prude, RN

## 2019-07-02 ENCOUNTER — Ambulatory Visit: Payer: Medicaid Other

## 2019-07-08 ENCOUNTER — Ambulatory Visit (INDEPENDENT_AMBULATORY_CARE_PROVIDER_SITE_OTHER): Payer: Self-pay | Admitting: Family Medicine

## 2019-07-08 ENCOUNTER — Other Ambulatory Visit: Payer: Self-pay

## 2019-07-08 VITALS — BP 100/64 | HR 70 | Wt 102.4 lb

## 2019-07-08 DIAGNOSIS — B3731 Acute candidiasis of vulva and vagina: Secondary | ICD-10-CM

## 2019-07-08 DIAGNOSIS — Z3200 Encounter for pregnancy test, result unknown: Secondary | ICD-10-CM

## 2019-07-08 DIAGNOSIS — B373 Candidiasis of vulva and vagina: Secondary | ICD-10-CM

## 2019-07-08 DIAGNOSIS — N939 Abnormal uterine and vaginal bleeding, unspecified: Secondary | ICD-10-CM

## 2019-07-08 HISTORY — DX: Acute candidiasis of vulva and vagina: B37.31

## 2019-07-08 HISTORY — DX: Candidiasis of vulva and vagina: B37.3

## 2019-07-08 LAB — POCT URINE PREGNANCY: Preg Test, Ur: NEGATIVE

## 2019-07-08 MED ORDER — FLUCONAZOLE 150 MG PO TABS: ORAL_TABLET | ORAL | 0 refills | Status: DC

## 2019-07-08 NOTE — Progress Notes (Signed)
    SUBJECTIVE:   CHIEF COMPLAINT / HPI: pregnancy test   Erin Wilkins is Wilkins 24 yo female presenting to discuss the following:   Spotting: Last LMP 2/14. Recently treated for chlamydia, received azithromycin on 2/22, however vomited shortly after, then received doxy x7 days. Just completed course on Monday, 3/1. She was concerned about pregnancy because she noticed some light pink tint to her vaginal discharge for 3 days (2/26-2/28). No associated abdominal cramping, dysuria. Has had Wilkins little increased white vaginal discharge and itching just starting yesterday, thinks she may have Wilkins yeast infection after antibiotics. U-preg at home was negative but she wanted to have an additional test here. Last sexually active 1 month ago, she is unsure if they used protection.  Cycles normally monthly, 7 days.  No other associated concerns including nausea, lightheadedness, breast fullness.  Scheduled for IUD placement on 3/11.  PERTINENT  PMH / PSH: Recent chlamydia treated, GERD   OBJECTIVE:   BP 100/64   Pulse 70   Wt 102 lb 6.4 oz (46.4 kg)   LMP 06/21/2019 (Exact Date)   SpO2 100%   BMI 19.35 kg/m   General: Alert, NAD HEENT: NCAT, MMM  Lungs: Unlabored breathing  Abdomen: soft Msk: Moves all extremities spontaneously  Ext: Warm, dry  ASSESSMENT/PLAN:   Vaginal spotting Few days of light spotting, resolved.  Due to this, patient had concerns for pregnancy, however reassuringly had Wilkins negative test at home and in the clinic today.  Thus low concern for pregnancy especially as last sexually active >1 month ago with normal menstrual cycle in between.  Would recommend repeat urine pregnancy test prior to IUD placement on 3/11.   Yeast vaginitis Noticed mild increase in vaginal itching since completion of antibiotics.  Will send in Diflucan.   Follow-up on 3/11 for IUD placement, recommend Upreg prior to, or sooner if needed if bleeding recurs.   Allayne Stack, DO [facility]  Center

## 2019-07-08 NOTE — Assessment & Plan Note (Signed)
Few days of light spotting, resolved.  Due to this, patient had concerns for pregnancy, however reassuringly had a negative test at home and in the clinic today.  Thus low concern for pregnancy especially as last sexually active >1 month ago with normal menstrual cycle in between.  Would recommend repeat urine pregnancy test prior to IUD placement on 3/11.

## 2019-07-08 NOTE — Assessment & Plan Note (Signed)
Resolved, may have been in the setting of recent chlamydia treatment.

## 2019-07-08 NOTE — Patient Instructions (Addendum)
It was wonderful to see you today! Your pregnancy test is negative. If you have a recurrence of frequent spotting, please let us know.

## 2019-07-08 NOTE — Assessment & Plan Note (Signed)
Noticed mild increase in vaginal itching since completion of antibiotics.  Will send in Diflucan.

## 2019-07-16 ENCOUNTER — Ambulatory Visit: Payer: Medicaid Other

## 2019-07-30 ENCOUNTER — Ambulatory Visit: Payer: Medicaid Other

## 2019-08-04 ENCOUNTER — Encounter: Payer: Self-pay | Admitting: Family Medicine

## 2019-08-04 DIAGNOSIS — Z113 Encounter for screening for infections with a predominantly sexual mode of transmission: Secondary | ICD-10-CM | POA: Insufficient documentation

## 2019-09-07 ENCOUNTER — Telehealth: Payer: Self-pay

## 2019-09-07 ENCOUNTER — Other Ambulatory Visit: Payer: Self-pay

## 2019-09-07 ENCOUNTER — Ambulatory Visit (INDEPENDENT_AMBULATORY_CARE_PROVIDER_SITE_OTHER): Payer: Self-pay | Admitting: Family Medicine

## 2019-09-07 ENCOUNTER — Encounter: Payer: Self-pay | Admitting: Family Medicine

## 2019-09-07 VITALS — BP 122/82 | HR 91 | Ht 61.0 in | Wt 102.2 lb

## 2019-09-07 DIAGNOSIS — R002 Palpitations: Secondary | ICD-10-CM | POA: Insufficient documentation

## 2019-09-07 DIAGNOSIS — F430 Acute stress reaction: Secondary | ICD-10-CM

## 2019-09-07 LAB — POCT URINE PREGNANCY: Preg Test, Ur: NEGATIVE

## 2019-09-07 NOTE — Patient Instructions (Signed)
  [facility] [address],  (Spanish)   [address], [place]: [phone]) 8:30 - 12; 1 - 2:30  [facility] [employer],  [address] [place], [place]    ([phone]):8:30 - 12; 2 - 3PM    For your palpitations: I have obtained lab work. I will call or send a mychart message with the results.   I want you to follow up in 2 weeks so we can ensure these improve. Drink plenty of water and decrease caffeine intake. If you have another episode come in sooner.   Dr. Darin Engels

## 2019-09-07 NOTE — Telephone Encounter (Signed)
Noted and thank you

## 2019-09-07 NOTE — Telephone Encounter (Signed)
Patient calls nurse line regarding incident yesterday, in which she was evaluated by EMS. Patient reports yesterday afternoon she began experiencing increased anxiety, palpitations and difficulty breathing. Patient states onset happened when she got up to fix son lunch. EMS stated that they believed she could have been dehydrated or had an anxiety attack. They did not recommend being evaluated in the hospital, rather following up with PCP office. Patient not currently having any symptoms. Scheduled follow up in office this AM with Dr. Darin Engels.   To PCP and Dr. Carolee Rota, RN

## 2019-09-07 NOTE — Assessment & Plan Note (Signed)
Patient reports acute event of stress.  Her son does not seem to be bothered by it but she is worried.  I discussed with both Dr. Sheffield Slider and Dr. Jolyne Loa who reported that this is not a reportable offense.  Patient is to follow-up with counseling, [facility] information given to patient as they will be able to see both her and her son.  Strict return precautions given.  Follow-up in 2 weeks.

## 2019-09-07 NOTE — Assessment & Plan Note (Signed)
Patient appears to be having some palpitations when she describes racing heart rate and feeling jittery.  Unclear etiology at this time.  Most likely dehydration given decreased water intake and drinking caffeine.  I discussed increased fluid intake as well as limiting caffeine she is agreeable to this.  We will also need to rule out anemia so will obtain CBC.  Will obtain TSH to rule out hyperthyroidism especially given weight loss.  We will also obtain BMP to rule out electrolyte abnormalities.  Can consider panic disorder from her acute stress event.  Advised to follow-up in 2 weeks.
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
## 2019-09-08 LAB — CBC
Hematocrit: 41.4 % (ref 34.0–46.6)
Hemoglobin: 13.7 g/dL (ref 11.1–15.9)
MCH: 31.6 pg (ref 26.6–33.0)
MCHC: 33.1 g/dL (ref 31.5–35.7)
MCV: 95 fL (ref 79–97)
Platelets: 285 x10E3/uL (ref 150–450)
RBC: 4.34 x10E6/uL (ref 3.77–5.28)
RDW: 13 % (ref 11.7–15.4)
WBC: 7.6 x10E3/uL (ref 3.4–10.8)

## 2019-09-08 LAB — BASIC METABOLIC PANEL
BUN/Creatinine Ratio: 21 (ref 9–23)
BUN: 20 mg/dL (ref 6–20)
CO2: 18 mmol/L — ABNORMAL LOW (ref 20–29)
Calcium: 9.7 mg/dL (ref 8.7–10.2)
Chloride: 104 mmol/L (ref 96–106)
Creatinine, Ser: 0.96 mg/dL (ref 0.57–1.00)
GFR calc Af Amer: 96 mL/min/{1.73_m2} (ref >59)
GFR calc non Af Amer: 84 mL/min/{1.73_m2} (ref >59)
Glucose: 76 mg/dL (ref 65–99)
Potassium: 4 mmol/L (ref 3.5–5.2)
Sodium: 140 mmol/L (ref 134–144)

## 2019-09-08 LAB — TSH: TSH: 0.77 u[IU]/mL (ref 0.450–4.500)

## 2019-11-03 ENCOUNTER — Ambulatory Visit: Payer: Medicaid Other | Admitting: Family Medicine

## 2019-11-04 ENCOUNTER — Other Ambulatory Visit (HOSPITAL_COMMUNITY)
Admission: RE | Admit: 2019-11-04 | Discharge: 2019-11-04 | Disposition: A | Discharge status: Home / Self Care | Payer: Medicaid Other | Source: Ambulatory Visit | Attending: Family Medicine | Admitting: Family Medicine

## 2019-11-04 ENCOUNTER — Other Ambulatory Visit: Payer: Self-pay

## 2019-11-04 ENCOUNTER — Ambulatory Visit (INDEPENDENT_AMBULATORY_CARE_PROVIDER_SITE_OTHER): Payer: Self-pay | Admitting: Student in an Organized Health Care Education/Training Program

## 2019-11-04 VITALS — BP 102/62 | HR 79 | Ht 61.0 in | Wt 101.4 lb

## 2019-11-04 DIAGNOSIS — Z113 Encounter for screening for infections with a predominantly sexual mode of transmission: Secondary | ICD-10-CM | POA: Insufficient documentation

## 2019-11-04 DIAGNOSIS — R350 Frequency of micturition: Secondary | ICD-10-CM

## 2019-11-04 LAB — POCT URINALYSIS DIP (MANUAL ENTRY)
Bilirubin, UA: NEGATIVE
Blood, UA: NEGATIVE
Glucose, UA: NEGATIVE mg/dL
Ketones, POC UA: NEGATIVE mg/dL
Leukocytes, UA: NEGATIVE
Nitrite, UA: NEGATIVE
Protein Ur, POC: NEGATIVE mg/dL
Spec Grav, UA: >=1.03 — AB (ref 1.010–1.025)
Urobilinogen, UA: 0.2 E.U./dL
pH, UA: 6 (ref 5.0–8.0)

## 2019-11-04 NOTE — Progress Notes (Signed)
° ° °  SUBJECTIVE:   CHIEF COMPLAINT / HPI: pelvic pain  Patient presents with 1 week of worsening low back and pelvic pain which seems to be worsened by drinking alcohol.  Denies discharge, foul odor, blood.  Has increased frequency of urination and urge.  Denies any dysuria.  Denies any vaginal pruritus.  Symptoms are similar to past UTIs.  She has no concerns for STDs today but does endorse multiple new sexual partners without protection.  She is willing to get tested for STDs today.  PERTINENT  PMH / PSH: Taking OCPs  OBJECTIVE:   BP 102/62    Pulse 79    Ht 5\' 1"  (1.549 m)    Wt 101 lb 6.4 oz (46 kg)    LMP 10/15/2019    SpO2 100%    BMI 19.16 kg/m   General: NAD, pleasant, able to participate in exam Pelvic: Negative for external lesions on pelvic exam.  Vaginal canal negative for excessive discharge or lesions.  Cervix appears bright red.  Negative for tenderness on exam.  Negative for tenderness with uterus manipulation. Abdomen: soft, nontender, nondistended, no hepatic or splenomegaly, +BS Extremities: no edema or cyanosis. WWP. Skin: warm and dry, no rashes noted Neuro: alert and oriented x4, no focal deficits Psych: Normal affect and mood   ASSESSMENT/PLAN:   Screening examination for STD (sexually transmitted disease) Performed pelvic exam and obtained screening for GC chlamydia -Counseled patient on high risk sexual activity could possibly lead to HIV and syphilis infection and she was agreeable to getting blood tests however, patient fled the facility prior to obtaining labs.  Patient was called to return but respond or return. -Screening positive for BV but patient unable to be contacted. -Urinalysis negative for infection     12/15/2019, DO [facility]

## 2019-11-04 NOTE — Patient Instructions (Signed)
It was a pleasure to see you today!  To summarize our discussion for this visit:  We are testing for causes of pelvic discomfort and urinary frequency such as urine infection and sexually transmitted diseases (STDs)  I will call you with your results.   Please return to our clinic to see me as needed.  Call the clinic at [phone] if your symptoms worsen or you have any concerns.   Thank you for allowing me to take part in your care,  Dr. Jamelle Rushing   Safe Sex Practicing safe sex means taking steps before and during sex to reduce your risk of:  Getting an STI (sexually transmitted infection).  Giving your partner an STI.  Unwanted or unplanned pregnancy. How can I practice safe sex?     Ways you can practice safe sex  Limit your sexual partners to only one partner who is having sex with only you.  Avoid using alcohol and drugs before having sex. Alcohol and drugs can affect your judgment.  Before having sex with a new partner: ? Talk to your partner about past partners, past STIs, and drug use. ? Get screened for STIs and discuss the results with your partner. Ask your partner to get screened, too.  Check your body regularly for sores, blisters, rashes, or unusual discharge. If you notice any of these problems, visit your health care provider.  Avoid sexual contact if you have symptoms of an infection or you are being treated for an STI.  While having sex, use a condom. Make sure to: ? Use a condom every time you have vaginal, oral, or anal sex. Both females and males should wear condoms during oral sex. ? Keep condoms in place from the beginning to the end of sexual activity. ? Use a latex condom, if possible. Latex condoms offer the best protection. ? Use only water-based lubricants with a condom. Using petroleum-based lubricants or oils will weaken the condom and increase the chance that it will break. Ways your health care provider can help you practice  safe sex  See your health care provider for regular screenings, exams, and tests for STIs.  Talk with your health care provider about what kind of birth control (contraception) is best for you.  Get vaccinated against hepatitis B and human papillomavirus (HPV).  If you are at risk of being infected with HIV (human immunodeficiency virus), talk with your health care provider about taking a prescription medicine to prevent HIV infection. You are at risk for HIV if you: ? Are a man who has sex with other men. ? Are sexually active with more than one partner. ? Take drugs by injection. ? Have a sex partner who has HIV. ? Have unprotected sex. ? Have sex with someone who has sex with both men and women. ? Have had an STI. Follow these instructions at home:  Take over-the-counter and prescription medicines as told by your health care provider.  Keep all follow-up visits as told by your health care provider. This is important. Where to find more information  Centers for Disease Control and Prevention: LessFurniture.be  Planned Parenthood: https://www.plannedparenthood.org/  Office on Women's Health: EmploymentTracking.tn Summary  Practicing safe sex means taking steps before and during sex to reduce your risk of STIs, giving your partner STIs, and having an unwanted or unplanned pregnancy.  Before having sex with a new partner, talk to your partner about past partners, past STIs, and drug use.  Use a condom every time you have  vaginal, oral, or anal sex. Both females and males should wear condoms during oral sex.  Check your body regularly for sores, blisters, rashes, or unusual discharge. If you notice any of these problems, visit your health care provider.  See your health care provider for regular screenings, exams, and tests for STIs. This information is not intended to replace advice given to you by  your health care provider. Make sure you discuss any questions you have with your health care provider. Document Revised: 08/15/2018 Document Reviewed: 02/03/2018 Elsevier Patient Education  2020 [employer].

## 2019-11-06 ENCOUNTER — Encounter (HOSPITAL_COMMUNITY): Payer: Self-pay

## 2019-11-06 ENCOUNTER — Ambulatory Visit (HOSPITAL_COMMUNITY)
Admission: EM | Admit: 2019-11-06 | Discharge: 2019-11-06 | Disposition: A | Discharge status: Home / Self Care | Payer: Medicaid Other | Attending: Emergency Medicine | Admitting: Emergency Medicine

## 2019-11-06 ENCOUNTER — Other Ambulatory Visit: Payer: Self-pay

## 2019-11-06 DIAGNOSIS — S61319A Laceration without foreign body of unspecified finger with damage to nail, initial encounter: Secondary | ICD-10-CM

## 2019-11-06 LAB — CERVICOVAGINAL ANCILLARY ONLY
Bacterial Vaginitis (gardnerella): POSITIVE — AB
Chlamydia: NEGATIVE
Comment: NEGATIVE
Comment: NEGATIVE
Comment: NEGATIVE
Comment: NORMAL
Neisseria Gonorrhea: NEGATIVE
Trichomonas: NEGATIVE

## 2019-11-06 MED ORDER — LIDOCAINE HCL 2 % IJ SOLN: 5.0000 mL | Freq: Once | INTRAMUSCULAR | Status: DC

## 2019-11-06 NOTE — ED Provider Notes (Signed)
[facility]    CSN: [id number] Arrival date & time: 11/06/19  1013      History   Chief Complaint Chief Complaint  Patient presents with   Fingernail Injury    HPI Erin Wilkins is a 24 y.o. female.   2 days ago pt had nail bed ripped offwhile she was sleeping . Now is currently attached only to nail bed with small amount of tissue, painful  Has been trying to take off herself but to painful and bleeding.      Past Medical History:  Diagnosis Date   Anemia    Irregular uterine bleeding 04/24/2018   Pertussis 03/18/2012   UTI (urinary tract infection)     Patient Active Problem List   Diagnosis Date Noted   Palpitations 09/07/2019   Acute stress reaction 09/07/2019   Screen for sexually transmitted diseases 08/04/2019   Vaginal spotting 07/08/2019   Yeast vaginitis 07/08/2019   Gastroesophageal reflux disease 06/26/2019   Possible exposure to STD 06/24/2019   Chlamydia 10/14/2018    Past Surgical History:  Procedure Laterality Date   NO PAST SURGERIES      OB History    Gravida  1   Para  1   Term  1   Preterm      AB      Living  1     SAB      TAB      Ectopic      Multiple  0   Live Births  1            Home Medications    Prior to Admission medications   Medication Sig Start Date End Date Taking? Authorizing Provider  doxycycline (VIBRA-TABS) 100 MG tablet Take 1 tablet (100 mg total) by mouth 2 (two) times daily. 06/29/19   McDiarmid, Leighton Roach, MD  fluconazole (DIFLUCAN) 150 MG tablet Take 1 pill today by mouth and the other pill in 3 days. 07/08/19   Allayne Stack, DO  norelgestromin-ethinyl estradiol (ORTHO EVRA) 150-35 MCG/24HR transdermal patch Place 1 patch onto the skin once a week. 01/14/19   Melene Plan, MD    Family History Family History  Problem Relation Age of Onset   Hypertension Mother     Social History Social History   Tobacco Use   Smoking status: Never Smoker   Smokeless  tobacco: Never Used  Building services engineer Use: Never used  Substance Use Topics   Alcohol use: No   Drug use: No     Allergies   Amoxicillin, Ancef [cefazolin], and Penicillins   Review of Systems Review of Systems  Respiratory: Negative.   Cardiovascular: Negative.   Musculoskeletal:       RT pinky finger nail bed ripped off with artifical nail attached.   Skin:       Pink tissue to RT with small amount of bleeding   Neurological: Negative.      Physical Exam Triage Vital Signs ED Triage Vitals [11/06/19 1034]  Enc Vitals Group     BP 121/78     Pulse Rate 76     Resp 18     Temp 98 F (36.7 C)     Temp Source Oral     SpO2 100 %     Weight      Height      Head Circumference      Peak Flow      Pain Score 6  Pain Loc      Pain Edu?      Excl. in GC?    No data found.  Updated Vital Signs BP 121/78 (BP Location: Right Arm)    Pulse 76    Temp 98 F (36.7 C) (Oral)    Resp 18    LMP 10/15/2019    SpO2 100%   Visual Acuity     Physical Exam Cardiovascular:     Rate and Rhythm: Normal rate.  Pulmonary:     Effort: Pulmonary effort is normal.  Musculoskeletal:        General: Tenderness and signs of injury present.  Skin:    Capillary Refill: Capillary refill takes less than 2 seconds.     Comments: RSF tip of mail bed attached to artifical nail attached to pink tissue of nail bed. minimal bleeding with movement of nail. Warm pink tissue   Neurological:     Mental Status: She is alert.      UC Treatments / Results  Labs (all labs ordered are listed, but only abnormal results are displayed) Labs Reviewed - No data to display  EKG   Radiology No results found.  Procedures Nail Removal  Date/Time: 11/06/2019 10:51 AM Performed by: Coralyn Mark, NP Authorized by: Coralyn Mark, NP   Consent:    Consent obtained:  Verbal   Consent given by:  Patient   Risks discussed:  Bleeding, infection, pain and permanent nail  deformity   Alternatives discussed:  No treatment and alternative treatment Location:    Hand:  R small finger Pre-procedure details:    Skin preparation:  Betadine Anesthesia (see MAR for exact dosages):    Anesthesia method:  Local infiltration   Local anesthetic:  Lidocaine 2% w/o epi Nail Removal:    Nail removed:  Partial   Nail side:  Medial   Nail bed repaired: no     Removed nail replaced and anchored: no   Trephination:    Subungual hematoma drained: no   Ingrown nail:    Wedge excision of skin: yes     Nail matrix removed or ablated:  None Post-procedure details:    Dressing:  4x4 sterile gauze, petrolatum-impregnated gauze and gauze roll   Patient tolerance of procedure:  Tolerated well, no immediate complications   (including critical care time)  Medications Ordered in UC Medications - No data to display  Initial Impression / Assessment and Plan / UC Course  I have reviewed the triage vital signs and the nursing notes.  Pertinent labs & imaging results that were available during my care of the patient were reviewed by me and considered in my medical decision making (see chart for details).    Wash dry area will daily  Keep covered  Once area heals she can have the rest of artifical nail removed   Final Clinical Impressions(s) / UC Diagnoses   Final diagnoses:  None   Discharge Instructions   None    ED Prescriptions    None     PDMP not reviewed this encounter.   Coralyn Mark, NP 11/06/19 1337

## 2019-11-06 NOTE — ED Triage Notes (Signed)
Pt presents with right pinky finger nail injury X 2 days ago; the nail is cracked all the way across and hanging on by a small piece of skin.

## 2019-11-06 NOTE — Discharge Instructions (Signed)
Starting tomorrow wash dry the area well  Keep wrapped daily  Will need to follow up with your primary care in 1 week  Your finger may be numb for a few hours  Can take motrin or tylenol as needed for pain  Will need to wait till healed to remove the portion of artifical nail left on

## 2019-11-09 NOTE — Assessment & Plan Note (Signed)
Performed pelvic exam and obtained screening for GC chlamydia -Counseled patient on high risk sexual activity could possibly lead to HIV and syphilis infection and she was agreeable to getting blood tests however, patient fled the facility prior to obtaining labs.  Patient was called to return but respond or return. -Screening positive for BV but patient unable to be contacted. -Urinalysis negative for infection

## 2019-11-10 ENCOUNTER — Telehealth: Payer: Self-pay

## 2019-11-10 DIAGNOSIS — B9689 Other specified bacterial agents as the cause of diseases classified elsewhere: Secondary | ICD-10-CM

## 2019-11-10 NOTE — Telephone Encounter (Signed)
Patient LVM on nurse line requesting treatment for +BV from 6/30. Will forward to provider who saw patient.

## 2019-11-13 ENCOUNTER — Encounter (HOSPITAL_COMMUNITY): Payer: Self-pay

## 2019-11-13 ENCOUNTER — Other Ambulatory Visit: Payer: Self-pay

## 2019-11-13 ENCOUNTER — Ambulatory Visit (HOSPITAL_COMMUNITY)
Admission: EM | Admit: 2019-11-13 | Discharge: 2019-11-13 | Disposition: A | Discharge status: Home / Self Care | Payer: Medicaid Other | Attending: Family Medicine | Admitting: Family Medicine

## 2019-11-13 DIAGNOSIS — Z20822 Contact with and (suspected) exposure to covid-19: Secondary | ICD-10-CM | POA: Diagnosis present

## 2019-11-13 DIAGNOSIS — J069 Acute upper respiratory infection, unspecified: Secondary | ICD-10-CM | POA: Diagnosis not present

## 2019-11-13 DIAGNOSIS — U071 COVID-19: Secondary | ICD-10-CM | POA: Insufficient documentation

## 2019-11-13 LAB — SARS CORONAVIRUS 2 (TAT 6-24 HRS): SARS Coronavirus 2: POSITIVE — AB

## 2019-11-13 MED ORDER — BENZONATATE 200 MG PO CAPS: 200.0000 mg | ORAL_CAPSULE | Freq: Three times a day (TID) | ORAL | 0 refills | Status: AC | PRN

## 2019-11-13 MED ORDER — METRONIDAZOLE 500 MG PO TABS: 500.0000 mg | ORAL_TABLET | Freq: Two times a day (BID) | ORAL | 0 refills | Status: DC

## 2019-11-13 MED ORDER — DM-GUAIFENESIN ER 30-600 MG PO TB12
1.0000 | ORAL_TABLET | Freq: Two times a day (BID) | ORAL | 0 refills | Status: DC
Start: 2019-11-13 — End: 2020-02-08

## 2019-11-13 NOTE — Discharge Instructions (Signed)
Covid test pending, monitor my chart for results May use Tessalon/benzonatate every 8 hours as needed for cough Mucinex DM twice daily to further help with congestion/cough Tylenol and ibuprofen as needed for pain, body aches, headaches, fever Rest and drink plenty of fluids Follow-up if any symptoms not improving or worse

## 2019-11-13 NOTE — Telephone Encounter (Signed)
I highly recommend completing screening for HIV and syphilis

## 2019-11-13 NOTE — ED Triage Notes (Signed)
Pt presents for COVID testing after exposure 4 days ago. States having headaches, cough and fever x 1 day.

## 2019-11-13 NOTE — ED Provider Notes (Signed)
[facility]    CSN: [id number] Arrival date & time: 11/13/19  1208      History   Chief Complaint Chief Complaint  Patient presents with   Covid Exposure   Headache   Cough    HPI Erin Wilkins is a 24 y.o. female presenting today for evaluation of URI symptoms.  Patient reports over the past couple days she has had headache, body aches as well as some cough and congestion.  Reports she had recent exposure to someone who is positive Covid approximately 4 days ago.  She denies any chest pain or shortness of breath.  Denies any GI symptoms.  Has been using Tylenol for aches.  HPI  Past Medical History:  Diagnosis Date   Anemia    Irregular uterine bleeding 04/24/2018   Pertussis 03/18/2012   UTI (urinary tract infection)     Patient Active Problem List   Diagnosis Date Noted   Palpitations 09/07/2019   Acute stress reaction 09/07/2019   Screening examination for STD (sexually transmitted disease) 08/04/2019   Vaginal spotting 07/08/2019   Yeast vaginitis 07/08/2019   Gastroesophageal reflux disease 06/26/2019   Possible exposure to STD 06/24/2019   Chlamydia 10/14/2018    Past Surgical History:  Procedure Laterality Date   NO PAST SURGERIES      OB History    Gravida  1   Para  1   Term  1   Preterm      AB      Living  1     SAB      TAB      Ectopic      Multiple  0   Live Births  1            Home Medications    Prior to Admission medications   Medication Sig Start Date End Date Taking? Authorizing Provider  benzonatate (TESSALON) 200 MG capsule Take 1 capsule (200 mg total) by mouth 3 (three) times daily as needed for up to 7 days for cough. 11/13/19 11/20/19  Rudolpho Claxton C, PA-C  dextromethorphan-guaiFENesin (MUCINEX DM) 30-600 MG 12hr tablet Take 1 tablet by mouth 2 (two) times daily. 11/13/19   Jammie Clink C, PA-C  norelgestromin-ethinyl estradiol (ORTHO EVRA) 150-35 MCG/24HR transdermal patch Place 1  patch onto the skin once a week. 01/14/19   Melene Plan, MD    Family History Family History  Problem Relation Age of Onset   Hypertension Mother     Social History Social History   Tobacco Use   Smoking status: Never Smoker   Smokeless tobacco: Never Used  Building services engineer Use: Never used  Substance Use Topics   Alcohol use: No   Drug use: No     Allergies   Amoxicillin, Ancef [cefazolin], and Penicillins   Review of Systems Review of Systems  Constitutional: Positive for fatigue. Negative for activity change, appetite change, chills and fever.  HENT: Positive for congestion. Negative for ear pain, rhinorrhea, sinus pressure, sore throat and trouble swallowing.   Eyes: Negative for discharge and redness.  Respiratory: Positive for cough. Negative for chest tightness and shortness of breath.   Cardiovascular: Negative for chest pain.  Gastrointestinal: Negative for abdominal pain, diarrhea, nausea and vomiting.  Musculoskeletal: Positive for myalgias.  Skin: Negative for rash.  Neurological: Positive for headaches. Negative for dizziness and light-headedness.     Physical Exam Triage Vital Signs ED Triage Vitals  Enc Vitals Group  BP      Pulse      Resp      Temp      Temp src      SpO2      Weight      Height      Head Circumference      Peak Flow      Pain Score      Pain Loc      Pain Edu?      Excl. in GC?    No data found.  Updated Vital Signs BP 112/84 (BP Location: Right Arm)    Pulse 97    Temp 99.3 F (37.4 C) (Oral)    Resp 17    LMP 10/14/2019 (Exact Date)    SpO2 98%   Visual Acuity Right Eye Distance:   Left Eye Distance:   Bilateral Distance:    Right Eye Near:   Left Eye Near:    Bilateral Near:     Physical Exam Vitals and nursing note reviewed.  Constitutional:      Appearance: She is well-developed.     Comments: No acute distress  HENT:     Head: Normocephalic and atraumatic.     Ears:     Comments:  Bilateral ears without tenderness to palpation of external auricle, tragus and mastoid, EAC's without erythema or swelling, TM's with good bony landmarks and cone of light. Non erythematous.     Nose: Nose normal.     Mouth/Throat:     Comments: Oral mucosa pink and moist, no tonsillar enlargement or exudate. Posterior pharynx patent and nonerythematous, no uvula deviation or swelling. Normal phonation. Eyes:     Conjunctiva/sclera: Conjunctivae normal.  Cardiovascular:     Rate and Rhythm: Normal rate.  Pulmonary:     Effort: Pulmonary effort is normal. No respiratory distress.     Comments: Breathing comfortably at rest, CTABL, no wheezing, rales or other adventitious sounds auscultated Abdominal:     General: There is no distension.  Musculoskeletal:        General: Normal range of motion.     Cervical back: Neck supple.  Skin:    General: Skin is warm and dry.  Neurological:     Mental Status: She is alert and oriented to person, place, and time.      UC Treatments / Results  Labs (all labs ordered are listed, but only abnormal results are displayed) Labs Reviewed  SARS CORONAVIRUS 2 (TAT 6-24 HRS)    EKG   Radiology No results found.  Procedures Procedures (including critical care time)  Medications Ordered in UC Medications - No data to display  Initial Impression / Assessment and Plan / UC Course  I have reviewed the triage vital signs and the nursing notes.  Pertinent labs & imaging results that were available during my care of the patient were reviewed by me and considered in my medical decision making (see chart for details).     URI symptoms with cough x3 days, vital signs stable, exam unremarkable.  Suspect likely viral etiology.  Covid test pending.  Recommend symptomatic and supportive care and would expect gradual self resolution.  Discussed strict return precautions. Patient verbalized understanding and is agreeable with plan.  Final Clinical  Impressions(s) / UC Diagnoses   Final diagnoses:  Viral URI with cough  Exposure to COVID-19 virus     Discharge Instructions     Covid test pending, monitor my chart for results May use Tessalon/benzonatate every  8 hours as needed for cough Mucinex DM twice daily to further help with congestion/cough Tylenol and ibuprofen as needed for pain, body aches, headaches, fever Rest and drink plenty of fluids Follow-up if any symptoms not improving or worse    ED Prescriptions    Medication Sig Dispense Auth. Provider   benzonatate (TESSALON) 200 MG capsule Take 1 capsule (200 mg total) by mouth 3 (three) times daily as needed for up to 7 days for cough. 28 capsule Mykelle Cockerell C, PA-C   dextromethorphan-guaiFENesin (MUCINEX DM) 30-600 MG 12hr tablet Take 1 tablet by mouth 2 (two) times daily. 20 tablet Kaire Stary, [place] C, PA-C     PDMP not reviewed this encounter.   Lew Dawes, PA-C 11/13/19 1315

## 2019-11-14 ENCOUNTER — Telehealth: Payer: Self-pay | Admitting: Physician Assistant

## 2019-11-14 NOTE — Telephone Encounter (Signed)
Called to discuss with Erin Wilkins about Covid symptoms and the use of bamlanivimab/etesevimab or casirivimab/imdevimab, a monoclonal antibody infusion for those with mild to moderate Covid symptoms and at a high risk of hospitalization.     Pt is qualified for this infusion at the monoclonal antibody infusion center due to co-morbid conditions and/or a member of an at-risk group, however declines infusion at this time. Symptoms tier reviewed as well as criteria for ending isolation.  Symptoms reviewed that would warrant ED/Hospital evaluation. Preventative practices reviewed. Patient verbalized understanding. Patient advised to call back if he decides that he does want to get infusion. Callback number to the infusion center given. Patient advised to go to Urgent care or ED with severe symptoms. Last date pt would be eligible for infusion is 11/19/19.     Patient Active Problem List   Diagnosis Date Noted  . Palpitations 09/07/2019  . Acute stress reaction 09/07/2019  . Screening examination for STD (sexually transmitted disease) 08/04/2019  . Vaginal spotting 07/08/2019  . Yeast vaginitis 07/08/2019  . Gastroesophageal reflux disease 06/26/2019  . Possible exposure to STD 06/24/2019  . Chlamydia 10/14/2018    Cline Crock PA-C

## 2019-12-09 ENCOUNTER — Other Ambulatory Visit: Payer: Self-pay

## 2019-12-09 ENCOUNTER — Ambulatory Visit (INDEPENDENT_AMBULATORY_CARE_PROVIDER_SITE_OTHER): Payer: Self-pay | Admitting: Family Medicine

## 2019-12-09 VITALS — BP 95/55 | HR 91 | Wt 99.0 lb

## 2019-12-09 DIAGNOSIS — Z3201 Encounter for pregnancy test, result positive: Secondary | ICD-10-CM

## 2019-12-09 DIAGNOSIS — Z7251 High risk heterosexual behavior: Secondary | ICD-10-CM

## 2019-12-09 LAB — POCT URINE PREGNANCY: Preg Test, Ur: POSITIVE — AB

## 2019-12-09 NOTE — Patient Instructions (Signed)
It was so wonderful to see you today.  Your urine pregnancy test was positive.  Please make sure you start taking a prenatal vitamin with at least of folic acid in it.  By your last menstrual cycle, you are approximately [redacted] weeks pregnant with a presumed due date around April/2022.  Please discuss your future plans with your partner, depending on what you decide please call our clinic to get your initial prenatal labs scheduled before your initial visit.

## 2019-12-09 NOTE — Progress Notes (Signed)
    SUBJECTIVE:   CHIEF COMPLAINT / HPI: Concern for pregnancy   Erin Wilkins is a 24 year old G1, P48 female presenting for concern of late menstrual cycle.  LMP 7/7, per her menstrual tracker she would be due for her cycle today.  LMP at that time normal for her, menstrual cycles occur monthly.  She is also been having bilateral breast soreness for the past 3-4 days, never gets this before her period.  Has not felt any lumps, skin erythema, or swelling.  No abdominal cramping or changes in vaginal discharge.  She is sexually active with a female partner, they have been in relationship for the past 2 years.  Last active 2 weeks ago, does not use protection.  She is not currently on any contraception, had considered the IUD previously however got nervous about having it placed.  She has been considering having another child.  Her son is now 31 years old, received her prenatal care through our clinic and had no major pregnancy/delivery complications.  Not taking a prenatal vitamin.  PERTINENT  PMH / PSH: History of chlamydia and yeast vaginitis, GERD  OBJECTIVE:   BP (!) 95/55   Pulse 91   Wt 99 lb (44.9 kg)   LMP 11/11/2019   SpO2 98%   BMI 18.71 kg/m   General: Alert, NAD Lungs: No increased WOB  Breasts: right breast normal without mass, skin or nipple changes or axillary nodes, left breast normal without mass, skin or nipple changes or axillary nodes. Abdomen: soft, non-tender Msk: Normal gait   Urinary pregnancy test positive.  ASSESSMENT/PLAN:   Pregnancy test positive Unexpected for patient, in the setting of late menstrual cycle with increased breast soreness.  First test positive in the office today, had not taken any at home, now G2P1001.  By sure LMP of 7/7, EDD in 08/2019, approximately [redacted] weeks gestation.  She is unsure on next steps, would like to discuss with her partner prior to establishing initial prenatal visit and obtaining labs.  Pending decision, will be calling our clinic later  this week to get a lab/initial prenatal visit as she also had her prenatal care here with her son.  Recommended starting PNV.     Follow-up as above or sooner if needed including significant vaginal bleeding/cramping.   Allayne Stack, DO [facility]

## 2019-12-10 DIAGNOSIS — Z3201 Encounter for pregnancy test, result positive: Secondary | ICD-10-CM | POA: Insufficient documentation

## 2019-12-10 NOTE — Assessment & Plan Note (Addendum)
Unexpected for patient, in the setting of late menstrual cycle with increased breast soreness.  First test positive in the office today, had not taken any at home, now G2P1001.  By sure LMP of 7/7, EDD in 08/2019, approximately [redacted] weeks gestation.  She is unsure on next steps, would like to discuss with her partner prior to establishing initial prenatal visit and obtaining labs.  Pending decision, will be calling our clinic later this week to get a lab/initial prenatal visit as she also had her prenatal care here with her son.  Recommended starting PNV.

## 2019-12-11 ENCOUNTER — Telehealth: Payer: Self-pay | Admitting: Family Medicine

## 2019-12-11 ENCOUNTER — Encounter: Payer: Self-pay | Admitting: Family Medicine

## 2019-12-11 NOTE — Telephone Encounter (Signed)
Called patient and informed her of [employer] and provided phone number.

## 2019-12-11 NOTE — Telephone Encounter (Signed)
Patient is wanting doctor to call her to talk about her next steps with the pregnancy, she does not want to go through with it, so shes asking for doctor Selena Batten to call to guide her on what to do.

## 2019-12-21 ENCOUNTER — Other Ambulatory Visit: Payer: Self-pay

## 2019-12-21 ENCOUNTER — Ambulatory Visit (INDEPENDENT_AMBULATORY_CARE_PROVIDER_SITE_OTHER): Payer: Self-pay

## 2019-12-21 DIAGNOSIS — Z111 Encounter for screening for respiratory tuberculosis: Secondary | ICD-10-CM

## 2019-12-21 NOTE — Progress Notes (Signed)
Patient is here for a PPD placement. PPD placed in left forearm. Patient will return 12/23/2019 to have PPD read.

## 2019-12-23 ENCOUNTER — Other Ambulatory Visit: Payer: Self-pay

## 2019-12-23 ENCOUNTER — Ambulatory Visit: Payer: Medicaid Other

## 2019-12-23 DIAGNOSIS — Z111 Encounter for screening for respiratory tuberculosis: Secondary | ICD-10-CM

## 2019-12-23 LAB — TB SKIN TEST
Induration: 0 mm
TB Skin Test: NEGATIVE

## 2019-12-23 NOTE — Progress Notes (Signed)
PPD Reading Note  PPD read and results entered in EpicCare.  Result: 0 mm induration.  Interpretation: negative  Allergic reaction: no

## 2020-01-13 ENCOUNTER — Ambulatory Visit (INDEPENDENT_AMBULATORY_CARE_PROVIDER_SITE_OTHER): Payer: Self-pay | Admitting: Family Medicine

## 2020-01-13 ENCOUNTER — Encounter: Payer: Self-pay | Admitting: Family Medicine

## 2020-01-13 ENCOUNTER — Other Ambulatory Visit: Payer: Self-pay

## 2020-01-13 VITALS — BP 92/60 | HR 76 | Ht 61.0 in | Wt 100.2 lb

## 2020-01-13 DIAGNOSIS — Z8742 Personal history of other diseases of the female genital tract: Secondary | ICD-10-CM | POA: Insufficient documentation

## 2020-01-13 DIAGNOSIS — Z309 Encounter for contraceptive management, unspecified: Secondary | ICD-10-CM | POA: Insufficient documentation

## 2020-01-13 DIAGNOSIS — Z30011 Encounter for initial prescription of contraceptive pills: Secondary | ICD-10-CM

## 2020-01-13 DIAGNOSIS — R399 Unspecified symptoms and signs involving the genitourinary system: Secondary | ICD-10-CM

## 2020-01-13 DIAGNOSIS — N390 Urinary tract infection, site not specified: Secondary | ICD-10-CM

## 2020-01-13 LAB — POCT URINALYSIS DIP (MANUAL ENTRY)
Bilirubin, UA: NEGATIVE
Glucose, UA: NEGATIVE mg/dL
Ketones, POC UA: NEGATIVE mg/dL
Nitrite, UA: POSITIVE — AB
Protein Ur, POC: 100 mg/dL — AB
Spec Grav, UA: 1.02 (ref 1.010–1.025)
Urobilinogen, UA: 1 E.U./dL
pH, UA: 8 (ref 5.0–8.0)

## 2020-01-13 LAB — POCT URINE PREGNANCY: Preg Test, Ur: NEGATIVE

## 2020-01-13 MED ORDER — NITROFURANTOIN MONOHYD MACRO 100 MG PO CAPS: 100.0000 mg | ORAL_CAPSULE | Freq: Two times a day (BID) | ORAL | 0 refills | Status: DC

## 2020-01-13 MED ORDER — NORETHIN ACE-ETH ESTRAD-FE 1-20 MG-MCG PO TABS: 1.0000 | ORAL_TABLET | Freq: Every day | ORAL | 11 refills | Status: DC

## 2020-01-13 NOTE — Assessment & Plan Note (Signed)
Pregnancy test negative today. She was encouraged to wait for at least 1 more month to have her IUD placed to avoid risk of infection given her recent pregnancy termination. She does not smoke, no history of blood clots. -Loestrin provided today -Encouraged to return in 1 month for IUD placement

## 2020-01-13 NOTE — Patient Instructions (Signed)
UTI: I have sent in a prescription of Macrobid.  This is the antibiotic that you can take twice a day for the next 5 days.  Birth control: I sent in a prescription of Loestrin.  This is a birth control pill that has a lower dose of estrogen and so usually has a better side effect profile.  Please schedule an appointment to come back for an IUD placement in 1 month.  At that point, it should be safe to place an IUD.  If you do start to experience significant abdominal pain, nausea, vomiting, fevers, please be seen soon to ensure that you do not have a uterine infection.

## 2020-01-13 NOTE — Progress Notes (Signed)
° ° °  SUBJECTIVE:   CHIEF COMPLAINT / HPI:   UTI For the past several days, Ms. Riege has been experiencing urinary urgency, frequency and significant urine malodor and haziness. She was suspicious she might have a UTI given for further evaluation. She specifically denies fever, nausea, stomach pain, suprapubic pain, low back pain, dysuria.  Recent elective termination of pregnancy She reports that she elected to terminate pregnancy about 3 weeks ago when the fetus was at 5 weeks of gestation. This was done with medication alone, no dilation or curettage was necessary. She believes that she has completed passing products of conception and denies any fever, abdominal pain, nausea, vomiting. She does note that she feels like she is not totally back to normal continues to have some mild breast tenderness. She was interested to know when she would fully recover from this pregnancy termination.  Birth control She is interested in having an IUD placed and would like that done as soon as possible. She is interested in oral contraceptives until she can have an IUD placed. She noted that she did have nausea and other uncomfortable side effects from a previous OCP.  PERTINENT  PMH / PSH: Recent elective termination of pregnancy  OBJECTIVE:   BP 92/60    Pulse 76    Ht 5\' 1"  (1.549 m)    Wt 100 lb 4 oz (45.5 kg)    LMP 11/11/2019    SpO2 98%    BMI 18.94 kg/m    General: Resting comfortably in the chair in the exam room. No acute distress. Respiratory: Breathing comfortably on room air. No acute distress. Abdomen: No tenderness with palpation. No palpable uterine fundus. Extremities: Warm, dry  ASSESSMENT/PLAN:   UTI (urinary tract infection) UA positive for nitrites and leukocyte esterase. Chart review shows allergies to penicillins and cephalosporins. -Nitrofurantoin 100 mg twice daily for 5 days   Encounter for birth control Pregnancy test negative today. She was encouraged to wait for at  least 1 more month to have her IUD placed to avoid risk of infection given her recent pregnancy termination. She does not smoke, no history of blood clots. -Loestrin provided today -Encouraged to return in 1 month for IUD placement  History of termination of pregnancy She was informed that 3 weeks is a normal period for to continue to experience some hormonal fluctuations related to recent pregnancy. She was encouraged to return to clinic if she did experience significant abdominal pain with nausea or vomiting.     01/12/2020, MD [facility]

## 2020-01-13 NOTE — Assessment & Plan Note (Signed)
UA positive for nitrites and leukocyte esterase. Chart review shows allergies to penicillins and cephalosporins. -Nitrofurantoin 100 mg twice daily for 5 days

## 2020-01-13 NOTE — Assessment & Plan Note (Signed)
She was informed that 3 weeks is a normal period for to continue to experience some hormonal fluctuations related to recent pregnancy. She was encouraged to return to clinic if she did experience significant abdominal pain with nausea or vomiting.

## 2020-01-15 ENCOUNTER — Ambulatory Visit: Payer: Medicaid Other

## 2020-02-08 ENCOUNTER — Other Ambulatory Visit: Payer: Self-pay

## 2020-02-08 ENCOUNTER — Ambulatory Visit (INDEPENDENT_AMBULATORY_CARE_PROVIDER_SITE_OTHER): Payer: Medicaid Other | Admitting: Family Medicine

## 2020-02-08 ENCOUNTER — Other Ambulatory Visit (HOSPITAL_COMMUNITY)
Admission: RE | Admit: 2020-02-08 | Discharge: 2020-02-08 | Disposition: A | Discharge status: Home / Self Care | Payer: Medicaid Other | Source: Ambulatory Visit | Attending: Family Medicine | Admitting: Family Medicine

## 2020-02-08 ENCOUNTER — Encounter: Payer: Self-pay | Admitting: Family Medicine

## 2020-02-08 VITALS — BP 110/72 | HR 88 | Wt 103.8 lb

## 2020-02-08 DIAGNOSIS — N898 Other specified noninflammatory disorders of vagina: Secondary | ICD-10-CM | POA: Insufficient documentation

## 2020-02-08 DIAGNOSIS — Z124 Encounter for screening for malignant neoplasm of cervix: Secondary | ICD-10-CM

## 2020-02-08 DIAGNOSIS — Z3202 Encounter for pregnancy test, result negative: Secondary | ICD-10-CM

## 2020-02-08 DIAGNOSIS — Z1159 Encounter for screening for other viral diseases: Secondary | ICD-10-CM | POA: Diagnosis not present

## 2020-02-08 DIAGNOSIS — Z113 Encounter for screening for infections with a predominantly sexual mode of transmission: Secondary | ICD-10-CM | POA: Diagnosis not present

## 2020-02-08 DIAGNOSIS — B9689 Other specified bacterial agents as the cause of diseases classified elsewhere: Secondary | ICD-10-CM

## 2020-02-08 DIAGNOSIS — N760 Acute vaginitis: Secondary | ICD-10-CM | POA: Diagnosis not present

## 2020-02-08 LAB — POCT WET PREP (WET MOUNT)
Clue Cells Wet Prep Whiff POC: POSITIVE
Trichomonas Wet Prep HPF POC: ABSENT

## 2020-02-08 LAB — POCT URINE PREGNANCY: Preg Test, Ur: NEGATIVE

## 2020-02-08 MED ORDER — METRONIDAZOLE 500 MG PO TABS: 500.0000 mg | ORAL_TABLET | Freq: Two times a day (BID) | ORAL | 0 refills | Status: DC

## 2020-02-08 MED ORDER — ONDANSETRON 4 MG PO TBDP: 4.0000 mg | ORAL_TABLET | Freq: Three times a day (TID) | ORAL | 0 refills | Status: DC | PRN

## 2020-02-08 NOTE — Patient Instructions (Addendum)
It was wonderful to see you today.  Please bring ALL of your medications with you to every visit.   Today we talked about:  - No more tea tree oil   - I will call you with results of your testing  - Take a prenatal every day  Think about contraception--let me know if you want to talk about this!   Think about the COVID and flu vaccine    Thank you for choosing [facility].   Please call [phone] with any questions about today's appointment.  Please be sure to schedule follow up at the front  desk before you leave today.   Terisa Starr, MD  Family Medicine

## 2020-02-08 NOTE — Progress Notes (Signed)
    SUBJECTIVE:   CHIEF COMPLAINT / HPI:   Erin Wilkins is a pleasant 24 year old with history significant for recurrent BV presenting today for vaginal symptoms.  The patient was recently diagnosed with a suspected UTI and treated with nitrofurantoin. She completed therapy and had resolution of her urinary symptoms. She then had onset of vaginal discharge. Three days ago, she tried a tea tree boric acid compound to alleviate her symptoms. This was intravaginal. This worsened her symptoms. Endorses associated pruritis. No nausea, vomiting, back pain, fevers. She is sexually active with 1 partner. She is interested in testing for STI today. LMP 9/18. She is not using anything for contraception. She had an TAB on in early September. She now thinks she may want an infant. She is taking a prenatal vitamin.    Her last Pap was just 3 years ago, NILM. She is due for repeat next week.   PERTINENT  PMH / PSH/Family/Social History : Updated and reviewed.   OBJECTIVE:   BP 110/72   Pulse 88   Wt 103 lb 12.8 oz (47.1 kg)   SpO2 99%   BMI 19.61 kg/m   Today's weight:  Last Weight  Most recent update: 02/08/2020 10:09 AM   Weight  47.1 kg (103 lb 12.8 oz)           Review of prior weights: Filed Weights   02/08/20 1009  Weight: 103 lb 12.8 oz (47.1 kg)    Cardiac: Warm well perfused.  Capillary refill less than 3 seconds Respiratory breathing comfortably on room air Psych: Pleasant normal affect, appropriate, normal rate of speech GU Exam:  Chaperoned exam.  External exam: Normal-appearing female external genitalia.  Vaginal exam notable for moderate discharge no ordor.  Cervix without discharge but with mild cervical irritation/friability.  Bimanual exam reveals normal sized uterus no masses appreciated, no pain with examination.    ASSESSMENT/PLAN:   Vaginal Discharge, + clue cells, likely has both irritation and BV. Sent for GC/CT. Discussed safe sex. Rx for metronidazole, discussed  follow up care, encouraged probiotic, discouraged use of tea tree oil.  Patient has nausea with metronidazole and as such Rx for zofran given. Discussed side effects of constipation.   STI screening, offered and recommended RPR, HIV, Hep C, obtained today.  Screening for cervical cancer- Pap smear for cytology, will call with results.  Pregnancy counseling, patient would now like to consider having a child, encouraged her to discuss with partner and take prenatal.   HCM Requires influenza and COVID vaccine, discussed on AVS today.   Terisa Starr, MD  Family Medicine Teaching Service  [facility]

## 2020-02-09 ENCOUNTER — Telehealth: Payer: Self-pay | Admitting: Family Medicine

## 2020-02-09 LAB — CERVICOVAGINAL ANCILLARY ONLY
Chlamydia: NEGATIVE
Comment: NEGATIVE
Comment: NEGATIVE
Comment: NORMAL
Neisseria Gonorrhea: POSITIVE — AB
Trichomonas: NEGATIVE

## 2020-02-09 LAB — HCV AB W REFLEX TO QUANT PCR: HCV Ab: <0.1 s/co ratio (ref 0.0–0.9)

## 2020-02-09 LAB — HCV INTERPRETATION

## 2020-02-09 LAB — RPR: RPR Ser Ql: NONREACTIVE

## 2020-02-09 LAB — HIV ANTIBODY (ROUTINE TESTING W REFLEX): HIV Screen 4th Generation wRfx: NONREACTIVE

## 2020-02-09 NOTE — Telephone Encounter (Signed)
Called patient with + Gonorrhea. Discussed etiology. Patient has hives to penicillin (on face in past). Per prior notes, should be given IM gentimicin. Will discuss with Pharmacy and ID. May need to go to HD. Discussed partner should be treated ASAP. She is abstain from intercourse.  Erin Starr, MD  Family Medicine Teaching Service

## 2020-02-10 ENCOUNTER — Telehealth: Payer: Self-pay | Admitting: Family Medicine

## 2020-02-10 NOTE — Telephone Encounter (Signed)
Called patient back regarding plan. Discussed with Pharmacy--agree, no Ceftriaxone. Called [facility], they do not have gentimicin. They often send to HD or treat with doxycycline (?) per nurse. Will send for CDC recommended treatment at HD. Called HD. Patient has appointment tomorrow. Information relayed to patient. Teachback method used. All questions answered. Will follow up in 1 week with phone call to ensure treatment completed. Again discussed importance of partner being treated. Will need test of recurrence at 3 months.   [facility]ed [facility]--appointment is 02/11/20 at [address] at 1 PM.   Terisa Starr, MD  [facility] Medicine Teaching Service

## 2020-02-11 LAB — CYTOLOGY - PAP
Diagnosis: NEGATIVE
Diagnosis: REACTIVE

## 2020-02-12 ENCOUNTER — Telehealth: Payer: Self-pay

## 2020-02-12 DIAGNOSIS — B9689 Other specified bacterial agents as the cause of diseases classified elsewhere: Secondary | ICD-10-CM

## 2020-02-12 MED ORDER — METRONIDAZOLE 0.75 % VA GEL: 1.0000 | Freq: Every day | VAGINAL | 0 refills | Status: AC

## 2020-02-12 NOTE — Telephone Encounter (Signed)
New Rx for intravaginal form sent.   Confirmed treated for gonorrhea at HD. Will need test of recurrence in 3 months.  Results of Pap smear relayed, repeat 3 years.  Terisa Starr, MD  Family Medicine Teaching Service

## 2020-02-12 NOTE — Telephone Encounter (Signed)
Patient calls nurse line stating that she has vomited two doses of metronidazole and is requesting cream for treatment of BV.   If appropriate, please send to [facility] [address]  To Dr. Lonzo Cloud, RN

## 2020-02-18 ENCOUNTER — Encounter: Payer: Self-pay | Admitting: Family Medicine

## 2020-02-22 IMAGING — US US ABDOMEN COMPLETE
1 series · 14 of 25 positions shown · non-contrast
Comparison: None.

CLINICAL DATA: Abdominal pain with elevated liver function tests

EXAM:
ABDOMEN ULTRASOUND COMPLETE

[Series 1: us abdomen complete · 0.17mm/px · 14 of 179 slices shown]
[im 1/179]
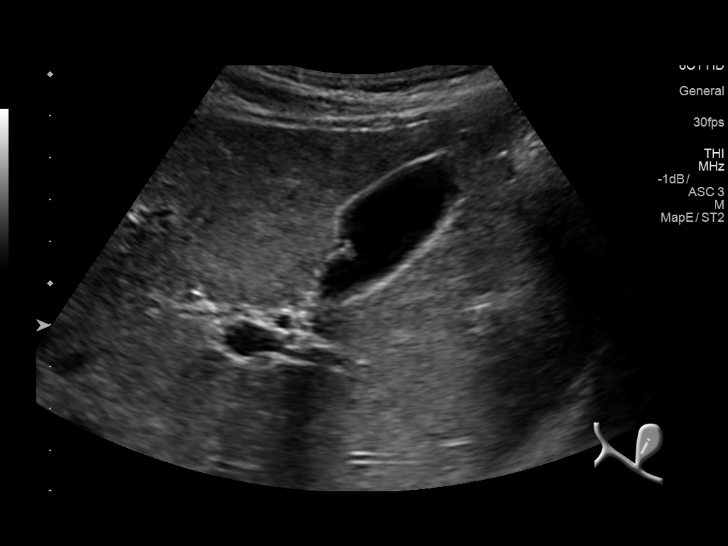
[im 15/179]
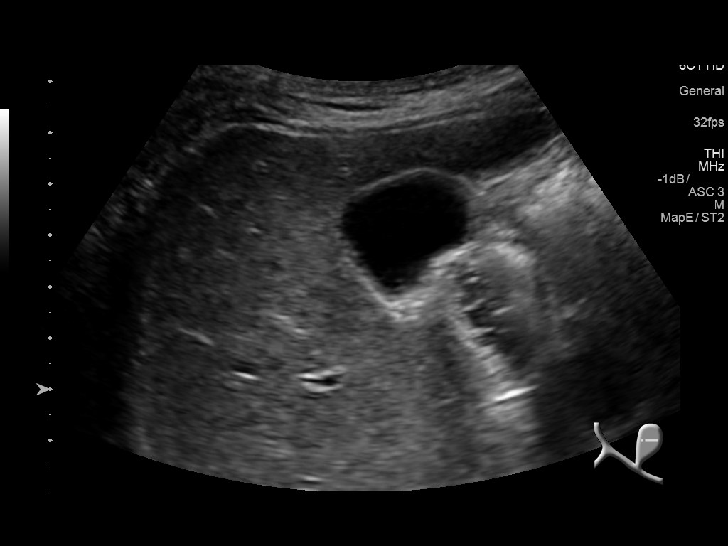
[im 30/179]
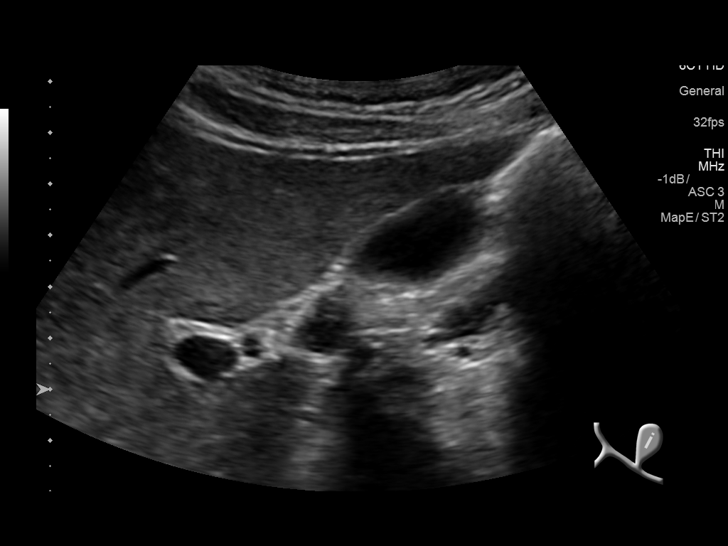
[im 45/179]
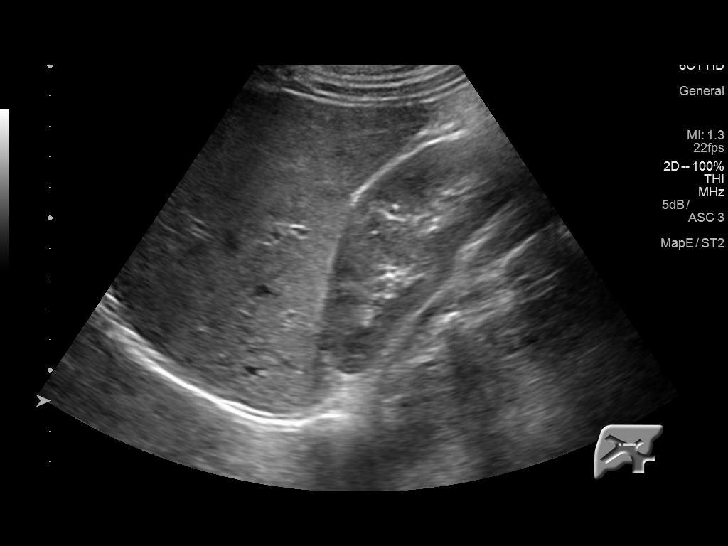
[im 60/179]
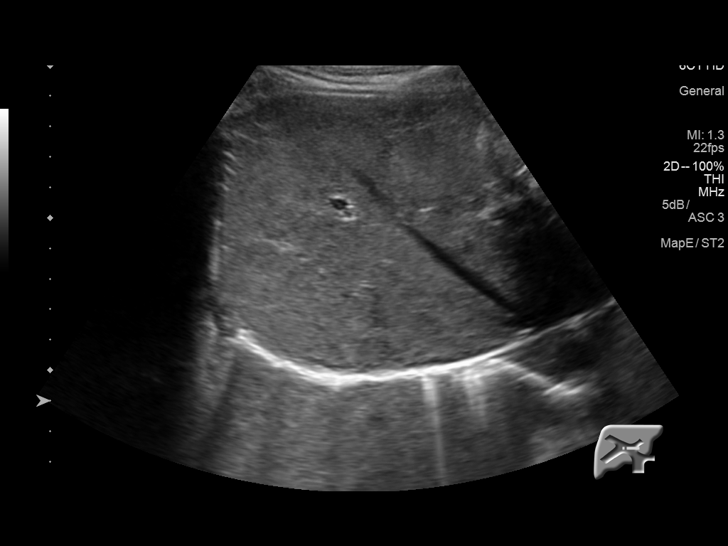
[im 67/179]
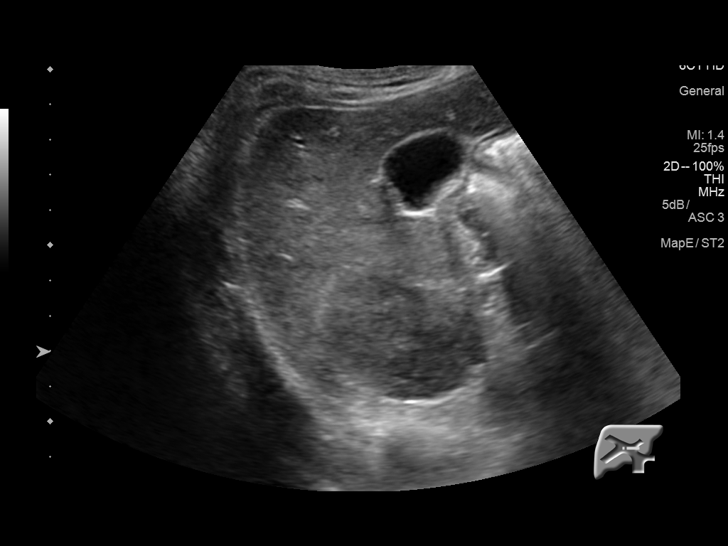
[im 82/179]
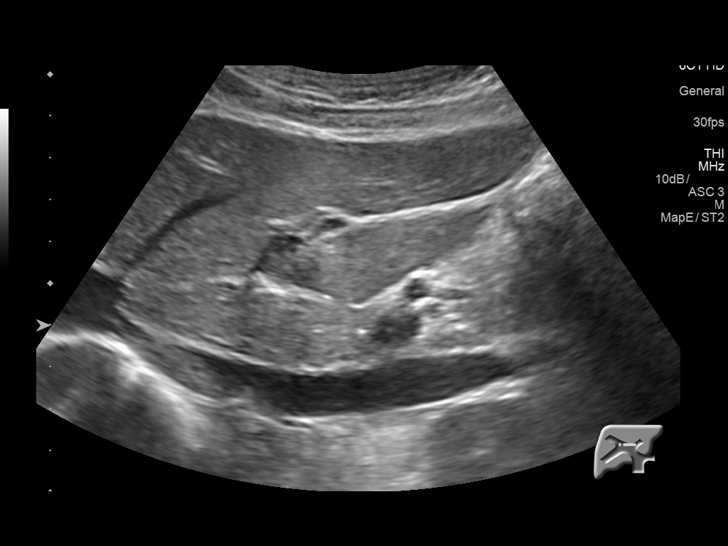
[im 97/179]
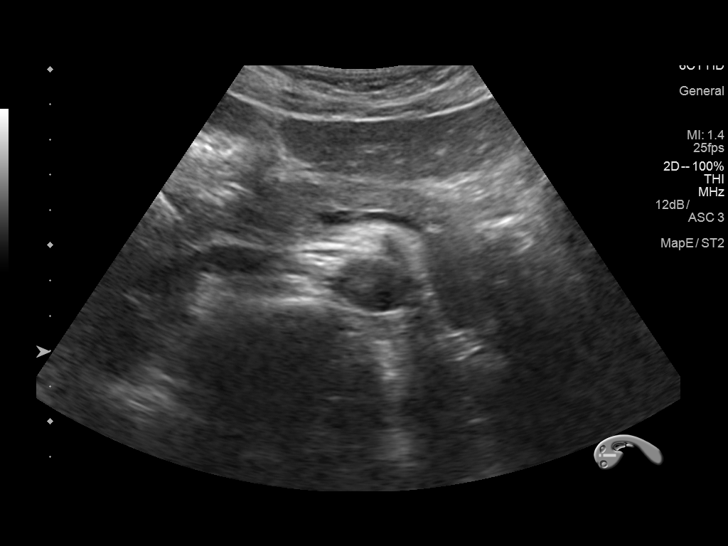
[im 112/179]
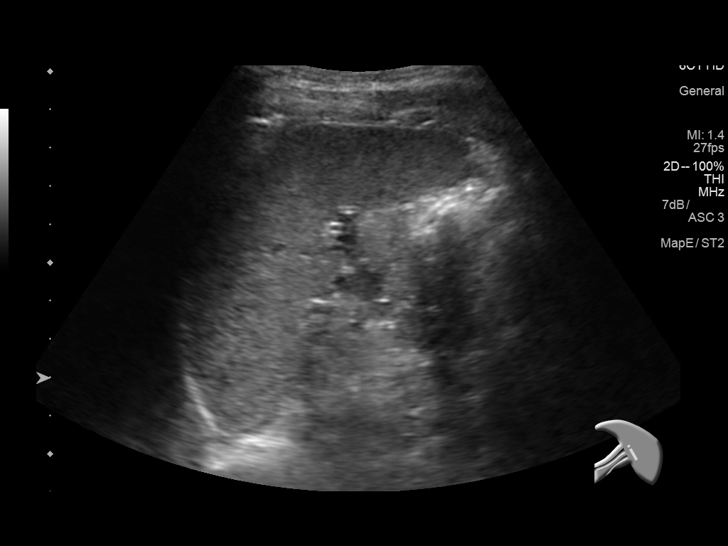
[im 119/179]
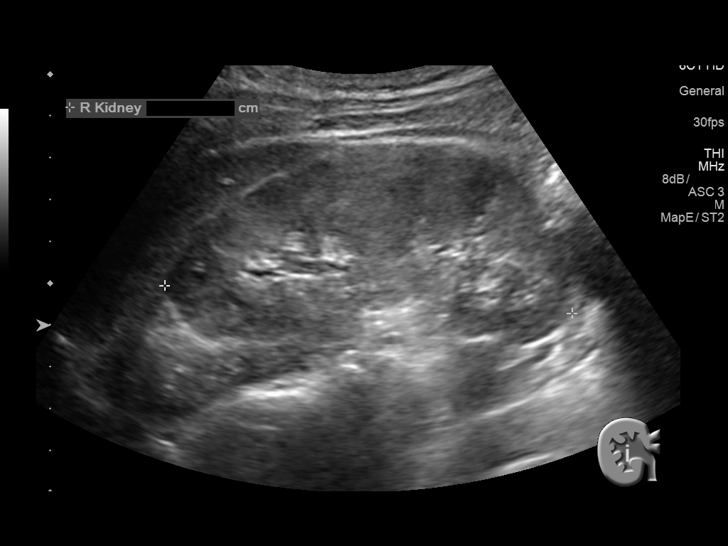
[im 134/179]
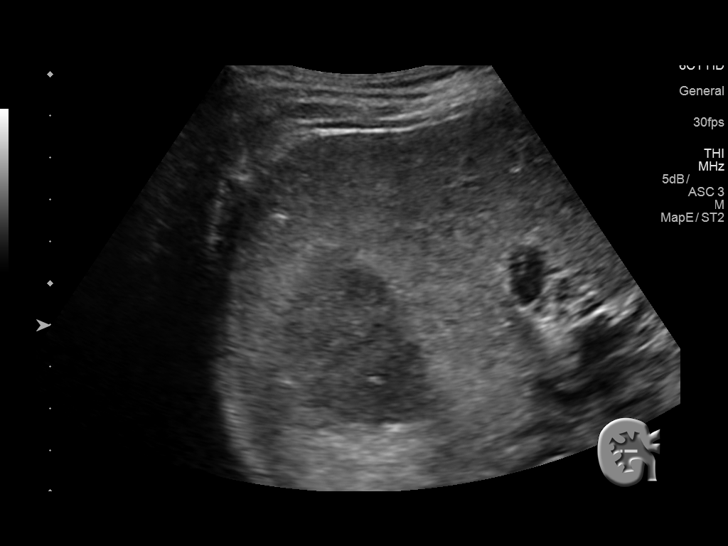
[im 149/179]
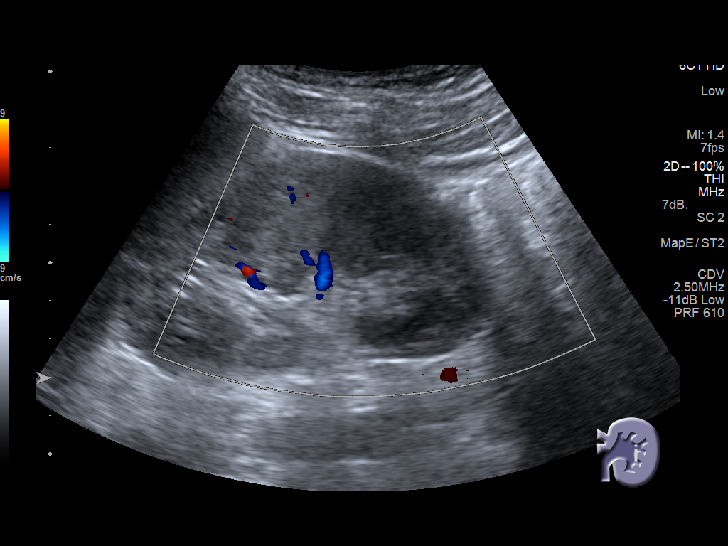
[im 164/179]
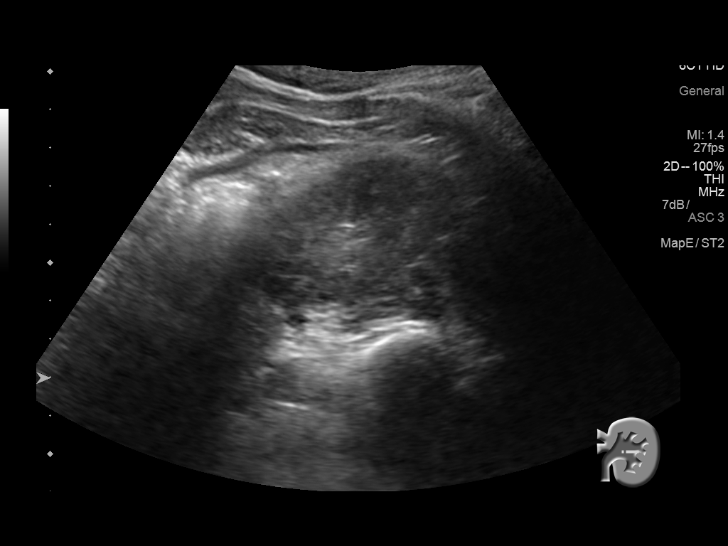
[im 179/179]
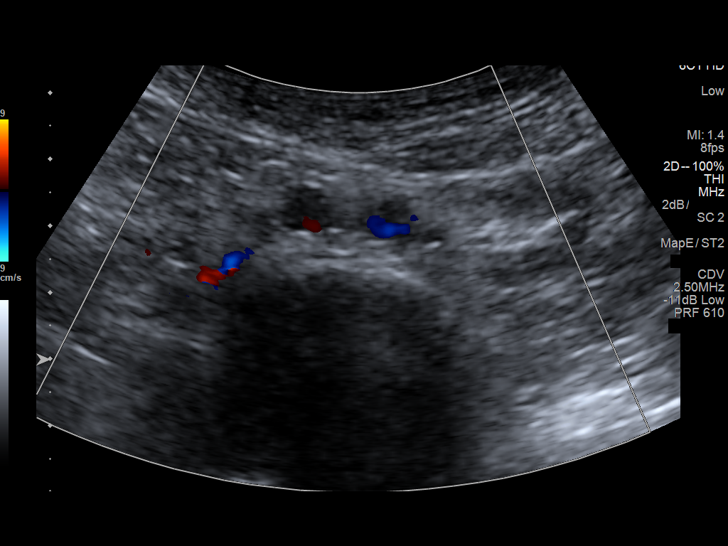

[14 of 25 positions shown; findings below may reference images not displayed]

FINDINGS: Gallbladder: Nonmobile nonshadowing 2 mm echogenic structure along
the wall. No shadowing gallstone or focal tenderness.

Common bile duct: Diameter: 2 mm

Liver: No focal lesion identified. Within normal limits in
parenchymal echogenicity. Portal vein is patent on color Doppler
imaging with normal direction of blood flow towards the liver.

IVC: No abnormality visualized.

Pancreas: Visualized portion unremarkable.

Spleen: Size and appearance within normal limits.

Right Kidney: Length: 10 cm. Echogenicity within normal limits. No
mass or hydronephrosis visualized.

Left Kidney: Length: 10 cm. Echogenicity within normal limits. No
mass or hydronephrosis visualized.

Abdominal aorta: No aneurysm visualized.
IMPRESSION: 1. No acute finding.  Normal appearance of the liver.
2. 2 mm gallbladder polyp.

## 2020-02-23 ENCOUNTER — Ambulatory Visit: Payer: Medicaid Other

## 2020-03-01 ENCOUNTER — Other Ambulatory Visit: Payer: Self-pay

## 2020-03-01 ENCOUNTER — Other Ambulatory Visit (HOSPITAL_COMMUNITY)
Admission: RE | Admit: 2020-03-01 | Discharge: 2020-03-01 | Disposition: A | Discharge status: Home / Self Care | Payer: Medicaid Other | Source: Ambulatory Visit | Attending: Family Medicine | Admitting: Family Medicine

## 2020-03-01 ENCOUNTER — Encounter: Payer: Self-pay | Admitting: Family Medicine

## 2020-03-01 ENCOUNTER — Ambulatory Visit (INDEPENDENT_AMBULATORY_CARE_PROVIDER_SITE_OTHER): Payer: Medicaid Other | Admitting: Family Medicine

## 2020-03-01 VITALS — BP 108/70 | HR 84 | Ht 61.0 in | Wt 103.6 lb

## 2020-03-01 DIAGNOSIS — Z8719 Personal history of other diseases of the digestive system: Secondary | ICD-10-CM

## 2020-03-01 DIAGNOSIS — Z3202 Encounter for pregnancy test, result negative: Secondary | ICD-10-CM | POA: Diagnosis not present

## 2020-03-01 DIAGNOSIS — R102 Pelvic and perineal pain: Secondary | ICD-10-CM | POA: Insufficient documentation

## 2020-03-01 DIAGNOSIS — Z113 Encounter for screening for infections with a predominantly sexual mode of transmission: Secondary | ICD-10-CM

## 2020-03-01 LAB — POCT URINE PREGNANCY: Preg Test, Ur: NEGATIVE

## 2020-03-01 LAB — POCT WET PREP (WET MOUNT)
Clue Cells Wet Prep Whiff POC: NEGATIVE
Trichomonas Wet Prep HPF POC: ABSENT
WBC, Wet Prep HPF POC: >20

## 2020-03-01 MED ORDER — POLYETHYLENE GLYCOL 3350 17 GM/SCOOP PO POWD: 17.0000 g | Freq: Two times a day (BID) | ORAL | 1 refills | Status: DC | PRN

## 2020-03-01 NOTE — Patient Instructions (Addendum)
It was wonderful to see you today.  Today we talked about:  Pelvic cramping. Your results today were negative. I will call you once the last test is in if abnormal. Otherwise I will communicate via Mychart.   For abdominal cramping and constipation we have prescribed miralax. Also consider increasing fiber in your diet.   We discussed this may be a 2 part visit to attempt to find the cause.   Please be sure to schedule follow up in 2 weeks if your symptoms continue.   Please call the clinic at [phone] if your symptoms worsen or you have any concerns. It was our pleasure to serve you.  Dr. Salvadore Dom

## 2020-03-01 NOTE — Progress Notes (Signed)
    SUBJECTIVE:   CHIEF COMPLAINT / HPI:   Erin Wilkins is a 24 yo F who presents for the acute issue below.   Pelvic cramping Was diagnosed with gonorrhea 10/4 and pelvic cramping started shortly after taking the oral medication and is continuous. When she has pain it is a 5 out of 10 pain and feels like a gas pain. She had emesis x3 days, not sure if she kept any of the oral medication down but also received a shot for treatment. LMP 10/6 appeared shorted than her regular cycle. HPT negative. Not currently on birth control. Hx of elective abortion 2 months prior. Hx of constipation for up to 1 week at a time that seems to resolve on its own or with coffee. Denies current sexual activity, nausea, vomiting, vaginal discharge or odor.   PERTINENT  PMH / PSH: GERD  OBJECTIVE:   BP 108/70   Pulse 84   Ht 5\' 1"  (1.549 m)   Wt 103 lb 9.6 oz (47 kg)   LMP 02/10/2020   SpO2 98%   BMI 19.58 kg/m   General: Appears well, no acute distress. Age appropriate. Cardiac: RRR, normal heart sounds, no murmurs Respiratory: CTAB, normal effort Pelvic exam: normal external genitalia, vulva, vagina, cervix, uterus and adnexa, CERVIX: normal appearing cervix without discharge or lesions, cervical discharge present - white, copious and creamy. Chaperone by 04/11/2020, CMA  ASSESSMENT/PLAN:   Pelvic cramping Likely acute on chronic with patient's history of consitpation and recent elective abortion and pelvic infection. UPT negative, Wet prep negative. GC/Ch TOC pending. Consider an abdominal component such as IBS if continues to be chronic. Will rule out acute issues such as incompletely treated pelvic infection with patient hx of emesis with oral medication, although IM treatment should be sufficient. -F/u GC/Ch -Miralax for constipation component  -F/u in 2 weeks if symptoms persist  Hx of constipation -Miralax daily as above  Melvenia Beam, DO [facility]

## 2020-03-02 LAB — CERVICOVAGINAL ANCILLARY ONLY
Chlamydia: NEGATIVE
Comment: NEGATIVE
Comment: NORMAL
Neisseria Gonorrhea: NEGATIVE

## 2020-03-03 DIAGNOSIS — R102 Pelvic and perineal pain: Secondary | ICD-10-CM | POA: Insufficient documentation

## 2020-03-03 DIAGNOSIS — Z8719 Personal history of other diseases of the digestive system: Secondary | ICD-10-CM | POA: Insufficient documentation

## 2020-03-03 NOTE — Assessment & Plan Note (Signed)
Likely acute on chronic with patient's history of consitpation and recent elective abortion and pelvic infection. UPT negative, Wet prep negative. GC/Ch TOC pending. Consider an abdominal component such as IBS if continues to be chronic. Will rule out acute issues such as incompletely treated pelvic infection with patient hx of emesis with oral medication, although IM treatment should be sufficient. -F/u GC/Ch -Miralax for constipation component  -F/u in 2 weeks if symptoms persist

## 2020-03-03 NOTE — Assessment & Plan Note (Signed)
-  Miralax daily as above

## 2020-03-04 ENCOUNTER — Encounter: Payer: Self-pay | Admitting: Family Medicine

## 2020-03-11 ENCOUNTER — Encounter (HOSPITAL_COMMUNITY): Payer: Self-pay | Admitting: Emergency Medicine

## 2020-03-11 ENCOUNTER — Other Ambulatory Visit: Payer: Self-pay

## 2020-03-11 ENCOUNTER — Ambulatory Visit (HOSPITAL_COMMUNITY)
Admission: EM | Admit: 2020-03-11 | Discharge: 2020-03-11 | Disposition: A | Discharge status: Home / Self Care | Payer: Self-pay | Attending: Emergency Medicine | Admitting: Emergency Medicine

## 2020-03-11 DIAGNOSIS — R102 Pelvic and perineal pain: Secondary | ICD-10-CM

## 2020-03-11 LAB — CBC WITH DIFFERENTIAL/PLATELET
Abs Immature Granulocytes: 0.02 10*3/uL (ref 0.00–0.07)
Basophils Absolute: 0 10*3/uL (ref 0.0–0.1)
Basophils Relative: 0 %
Eosinophils Absolute: 0 10*3/uL (ref 0.0–0.5)
Eosinophils Relative: 0 %
HCT: 41.6 % (ref 36.0–46.0)
Hemoglobin: 14.1 g/dL (ref 12.0–15.0)
Immature Granulocytes: 0 %
Lymphocytes Relative: 32 %
Lymphs Abs: 2.7 10*3/uL (ref 0.7–4.0)
MCH: 31.4 pg (ref 26.0–34.0)
MCHC: 33.9 g/dL (ref 30.0–36.0)
MCV: 92.7 fL (ref 80.0–100.0)
Monocytes Absolute: 0.5 10*3/uL (ref 0.1–1.0)
Monocytes Relative: 6 %
Neutro Abs: 5.2 10*3/uL (ref 1.7–7.7)
Neutrophils Relative %: 62 %
Platelets: 293 10*3/uL (ref 150–400)
RBC: 4.49 MIL/uL (ref 3.87–5.11)
RDW: 12.3 % (ref 11.5–15.5)
WBC: 8.6 10*3/uL (ref 4.0–10.5)
nRBC: 0 % (ref 0.0–0.2)

## 2020-03-11 LAB — POCT URINALYSIS DIPSTICK, ED / UC
Bilirubin Urine: NEGATIVE
Glucose, UA: NEGATIVE mg/dL
Hgb urine dipstick: NEGATIVE
Ketones, ur: 15 mg/dL — AB
Nitrite: NEGATIVE
Protein, ur: NEGATIVE mg/dL
Specific Gravity, Urine: 1.02 (ref 1.005–1.030)
Urobilinogen, UA: 1 mg/dL (ref 0.0–1.0)
pH: 7 (ref 5.0–8.0)

## 2020-03-11 LAB — POC URINE PREG, ED: Preg Test, Ur: NEGATIVE

## 2020-03-11 MED ORDER — NAPROXEN 500 MG PO TABS
500.0000 mg | ORAL_TABLET | Freq: Two times a day (BID) | ORAL | 0 refills | Status: DC
Start: 2020-03-11 — End: 2020-09-07

## 2020-03-11 NOTE — ED Provider Notes (Signed)
[facility]    CSN: [id number] Arrival date & time: 03/11/20  1310      History   Chief Complaint Chief Complaint  Patient presents with   Abdominal Pain   Back Pain   Dizziness    HPI Erin Wilkins is a 24 y.o. female.   Erin Wilkins presents with complaints of pelvic cramping, some low back pain as well as episode of dizziness. In august of 2021 she had a pharmaceutically induced elective abortion. She has had pelvic cramping since. She has also noted increased vaginal discharge which is thin and yellow/clear in color. Cramping comes and goes. Low back pain comes and goes. No urinary symptoms. She had been constipated- infrequent stools. She was started on miralax and has been taking daily, has had more regular bowel movements which she describes as soft. No nausea or vomiting. Today she had an episode of dizziness which prompted her visit. No fevers. She had vaginal spotting 10/6-10/6, and what she feels was a period 10/17-10/18. She felt that the cramping was worse when she was bleeding. She has not historically had much cramping with her periods prior to this. She is sexually active. She is not on birth control. She was treated for gonorrhea after testing positive 10/4, she endorses vomiting with taking the antibiotics for this, however. Re-tested 10/26 and was negative. No other medications or treatments for symptoms. She denies any current pain, dizziness or back pain.     ROS per HPI, negative if not otherwise mentioned.      Past Medical History:  Diagnosis Date   Anemia    Irregular uterine bleeding 04/24/2018   Pertussis 03/18/2012   UTI (urinary tract infection)     Patient Active Problem List   Diagnosis Date Noted   Pelvic cramping 03/03/2020   Hx of constipation 03/03/2020   Gastroesophageal reflux disease 06/26/2019    Past Surgical History:  Procedure Laterality Date   NO PAST SURGERIES      OB History    Gravida  1   Para  1    Term  1   Preterm      AB      Living  1     SAB      TAB      Ectopic      Multiple  0   Live Births  1            Home Medications    Prior to Admission medications   Medication Sig Start Date End Date Taking? Authorizing Provider  naproxen (NAPROSYN) 500 MG tablet Take 1 tablet (500 mg total) by mouth 2 (two) times daily. 03/11/20   Georgetta Haber, NP  ondansetron (ZOFRAN ODT) 4 MG disintegrating tablet Take 1 tablet (4 mg total) by mouth every 8 (eight) hours as needed for nausea or vomiting. 02/08/20   Westley Chandler, MD  polyethylene glycol powder (GLYCOLAX/MIRALAX) 17 GM/SCOOP powder Take 17 g by mouth 2 (two) times daily as needed. 03/01/20   Autry-Lott, Randa Evens, DO    Family History Family History  Problem Relation Age of Onset   Hypertension Mother     Social History Social History   Tobacco Use   Smoking status: Never Smoker   Smokeless tobacco: Never Used  Building services engineer Use: Never used  Substance Use Topics   Alcohol use: No   Drug use: No     Allergies   Amoxicillin, Ancef [cefazolin], and Penicillins  Review of Systems Review of Systems   Physical Exam Triage Vital Signs ED Triage Vitals  Enc Vitals Group     BP 03/11/20 1524 126/79     Pulse Rate 03/11/20 1524 85     Resp 03/11/20 1524 16     Temp 03/11/20 1524 98.4 F (36.9 C)     Temp Source 03/11/20 1524 Oral     SpO2 03/11/20 1524 98 %     Weight --      Height --      Head Circumference --      Peak Flow --      Pain Score 03/11/20 1511 0     Pain Loc --      Pain Edu? --      Excl. in GC? --    No data found.  Updated Vital Signs BP 126/79 (BP Location: Left Arm)    Pulse 85    Temp 98.4 F (36.9 C) (Oral)    Resp 16    LMP 02/10/2020    SpO2 98%   Visual Acuity Right Eye Distance:   Left Eye Distance:   Bilateral Distance:    Right Eye Near:   Left Eye Near:    Bilateral Near:     Physical Exam Constitutional:      General: She is not  in acute distress.    Appearance: She is well-developed.  Cardiovascular:     Rate and Rhythm: Normal rate.  Pulmonary:     Effort: Pulmonary effort is normal.  Abdominal:     General: Abdomen is flat.     Palpations: Abdomen is soft. Abdomen is not rigid.     Tenderness: There is no abdominal tenderness. There is no right CVA tenderness, left CVA tenderness, guarding or rebound.  Genitourinary:    Comments: Denies sores, lesions, vaginal bleeding; no pelvic pain; gu exam deferred at this time, vaginal self swab collected.   Skin:    General: Skin is warm and dry.  Neurological:     Mental Status: She is alert and oriented to person, place, and time.      UC Treatments / Results  Labs (all labs ordered are listed, but only abnormal results are displayed) Labs Reviewed  POCT URINALYSIS DIPSTICK, ED / UC - Abnormal; Notable for the following components:      Result Value   Ketones, ur 15 (*)    Leukocytes,Ua SMALL (*)    All other components within normal limits  CBC WITH DIFFERENTIAL/PLATELET  POC URINE PREG, ED  CERVICOVAGINAL ANCILLARY ONLY    EKG   Radiology No results found.  Procedures Procedures (including critical care time)  Medications Ordered in UC Medications - No data to display  Initial Impression / Assessment and Plan / UC Course  I have reviewed the triage vital signs and the nursing notes.  Pertinent labs & imaging results that were available during my care of the patient were reviewed by me and considered in my medical decision making (see chart for details).     Physical exam unremarkable today. No current symptoms. UA unremarkable today. Vaginal cytology collected and pending for repeat testing. Cbc collected to rule out anemia as potential source of dizziness. Hydration encouraged. To continue with miralax. Negative pregnancy by urine here today, no active bleeding or spotting, unlikely retained products, but this is still considered in  differentials. Encouraged close follow up with PCP and/or gynecology if symptoms persist. Return precautions provided. Patient verbalized understanding and agreeable  to plan.   Final Clinical Impressions(s) / UC Diagnoses   Final diagnoses:  Pelvic cramping     Discharge Instructions     Your urine does not indicate urinary tract infection.  We are re-testing the vagina for any source of infection- stds vs yeast or bacteria, which could be source of symptoms.  Continue with daily miralax to promote regular bowel movements. Drink plenty of water.  Naproxen as needed for cramping. Don't take additional ibuprofen, and take with food.  Please follow up with gynecology or your PCP as you may need additional evaluation if symptoms persist.     ED Prescriptions    Medication Sig Dispense Auth. Provider   naproxen (NAPROSYN) 500 MG tablet Take 1 tablet (500 mg total) by mouth 2 (two) times daily. 30 tablet Georgetta Haber, NP     PDMP not reviewed this encounter.   Georgetta Haber, NP 03/11/20 1556

## 2020-03-11 NOTE — ED Triage Notes (Addendum)
Pt c/o lower abd pain/pelvic and back pain with a burning sensation. She states this is an ongoing problem that she has seen her pcp for this and she recently has had an abortion 2 months ago. Pt states she was told she was constipated and started on miralax. She has felt dizzy for the last 2 days.

## 2020-03-11 NOTE — Discharge Instructions (Signed)
Your urine does not indicate urinary tract infection.  We are re-testing the vagina for any source of infection- stds vs yeast or bacteria, which could be source of symptoms.  Continue with daily miralax to promote regular bowel movements. Drink plenty of water.  Naproxen as needed for cramping. Don't take additional ibuprofen, and take with food.  Please follow up with gynecology or your PCP as you may need additional evaluation if symptoms persist.

## 2020-03-14 ENCOUNTER — Ambulatory Visit: Payer: Medicaid Other

## 2020-03-14 LAB — CERVICOVAGINAL ANCILLARY ONLY
Bacterial Vaginitis (gardnerella): NEGATIVE
Candida Glabrata: NEGATIVE
Candida Vaginitis: NEGATIVE
Chlamydia: NEGATIVE
Comment: NEGATIVE
Comment: NEGATIVE
Comment: NEGATIVE
Comment: NEGATIVE
Comment: NEGATIVE
Comment: NORMAL
Neisseria Gonorrhea: NEGATIVE
Trichomonas: NEGATIVE

## 2020-03-14 NOTE — Progress Notes (Deleted)
    SUBJECTIVE:   CHIEF COMPLAINT / HPI:   ***  PERTINENT  PMH / PSH: ***  OBJECTIVE:   There were no vitals taken for this visit.   Physical Exam: *** General: 24 y.o. female in NAD Cardio: RRR no m/r/g Lungs: CTAB, no wheezing, no rhonchi, no crackles, no IWOB on *** Abdomen: Soft, non-tender to palpation, non-distended, positive bowel sounds Skin: warm and dry Extremities: No edema   ASSESSMENT/PLAN:   No problem-specific Assessment & Plan notes found for this encounter.     Unknown Jim, DO [facility]

## 2020-03-20 ENCOUNTER — Other Ambulatory Visit: Payer: Self-pay

## 2020-03-20 ENCOUNTER — Inpatient Hospital Stay (HOSPITAL_COMMUNITY)
Admission: AD | Admit: 2020-03-20 | Discharge: 2020-03-20 | Disposition: A | Discharge status: Home / Self Care | Payer: Medicaid Other | Attending: Obstetrics & Gynecology | Admitting: Obstetrics & Gynecology

## 2020-03-20 ENCOUNTER — Encounter (HOSPITAL_COMMUNITY): Payer: Self-pay | Admitting: Obstetrics & Gynecology

## 2020-03-20 DIAGNOSIS — Z3202 Encounter for pregnancy test, result negative: Secondary | ICD-10-CM

## 2020-03-20 DIAGNOSIS — R109 Unspecified abdominal pain: Secondary | ICD-10-CM | POA: Insufficient documentation

## 2020-03-20 LAB — HCG, QUANTITATIVE, PREGNANCY: hCG, Beta Chain, Quant, S: <1 m[IU]/mL (ref <5)

## 2020-03-20 LAB — POCT PREGNANCY, URINE: Preg Test, Ur: NEGATIVE

## 2020-03-20 NOTE — MAU Provider Note (Signed)
First Provider Initiated Contact with Patient 03/20/20 1219      S [name] is a 24 y.o. G2P1011 patient who presents to MAU today with complaint of abdominal cramping & possible pregnancy. Reports taking 2 pregnancy tests at home yesterday, 1 was positive and 1 was negative. She has been having abdominal cramping, constipation, & intermittent dizziness for the last few months. Has been seen by her PCP for this - currently no changes in her symptoms. Is supposed to f/u with her PCP for continued symptoms.    O BP 129/76 (BP Location: Right Arm)    Pulse 83    Temp 98.6 F (37 C) (Oral)    Resp 15    LMP 02/20/2020    SpO2 100% Comment: room air Physical Exam Vitals reviewed.  Constitutional:      General: She is not in acute distress.    Appearance: She is well-developed.  HENT:     Head: Normocephalic and atraumatic.  Pulmonary:     Effort: Pulmonary effort is normal. No respiratory distress.  Neurological:     Mental Status: She is alert.  Psychiatric:        Mood and Affect: Mood normal.        Behavior: Behavior normal.      MDM UPT negative in MAU. HCG collected d/t report of positive home test HCG <1  Patient with ongoing symptoms being followed by her PCP. No new symptoms & patient in no distress. Instructed to contact her PCP for follow up  A Medical screening exam complete 1. Negative pregnancy test      P Discharge from MAU in stable condition F/u with PCP  Judeth Horn, NP 03/20/2020 12:19 PM

## 2020-03-20 NOTE — MAU Note (Signed)
Erin Wilkins is a 24 y.o. here in MAU reporting: states she had a positive and a negative pregnancy test yesterday. States she had an abortion 2 months ago and has been having left sided abdominal pain since then. No bleeding or discharge.  LMP: 02/20/20  Onset of complaint: ongoing  Pain score: 7/10  Vitals:   03/20/20 1212  BP: 129/76  Pulse: 83  Resp: 15  Temp: 98.6 F (37 C)  SpO2: 100%     Lab orders placed from triage: UPT

## 2020-03-30 ENCOUNTER — Encounter: Payer: Self-pay | Admitting: Family Medicine

## 2020-03-30 ENCOUNTER — Ambulatory Visit (INDEPENDENT_AMBULATORY_CARE_PROVIDER_SITE_OTHER): Payer: Medicaid Other | Admitting: Family Medicine

## 2020-03-30 ENCOUNTER — Other Ambulatory Visit: Payer: Self-pay

## 2020-03-30 ENCOUNTER — Other Ambulatory Visit (HOSPITAL_COMMUNITY)
Admission: RE | Admit: 2020-03-30 | Discharge: 2020-03-30 | Disposition: A | Discharge status: Home / Self Care | Payer: Medicaid Other | Source: Ambulatory Visit | Attending: Family Medicine | Admitting: Family Medicine

## 2020-03-30 VITALS — BP 98/64 | HR 86 | Ht 61.0 in | Wt 101.4 lb

## 2020-03-30 DIAGNOSIS — N898 Other specified noninflammatory disorders of vagina: Secondary | ICD-10-CM | POA: Diagnosis not present

## 2020-03-30 DIAGNOSIS — R102 Pelvic and perineal pain: Secondary | ICD-10-CM

## 2020-03-30 LAB — POCT WET PREP (WET MOUNT)
Clue Cells Wet Prep Whiff POC: NEGATIVE
Trichomonas Wet Prep HPF POC: ABSENT
WBC, Wet Prep HPF POC: >20

## 2020-03-30 MED ORDER — TERCONAZOLE 0.4 % VA CREA
1.0000 | TOPICAL_CREAM | Freq: Every day | VAGINAL | 2 refills | Status: DC
Start: 2020-03-30 — End: 2020-09-07

## 2020-03-30 NOTE — Assessment & Plan Note (Signed)
Very consistent with yeast infection I will call that prescription and.  We have also performed other sexually transmitted infection testing and I will notify her of those results.  She will let me know if this does not resolve.

## 2020-03-30 NOTE — Patient Instructions (Signed)
I went ahead and sent your rx in for yeast infection.

## 2020-03-30 NOTE — Assessment & Plan Note (Signed)
Pelvic cramping finally resolved after she had normal menstrual cycle

## 2020-03-30 NOTE — Progress Notes (Signed)
    CHIEF COMPLAINT / HPI: Vaginal discharge and irritation for the last 4 to 5 days.  No pelvic pain.  No odor.  No lesions.   PERTINENT  PMH / PSH: I have reviewed the patient's medications, allergies, past medical and surgical history, smoking status and updated in the EMR as appropriate.   OBJECTIVE:  BP 98/64   Pulse 86   Ht 5\' 1"  (1.549 m)   Wt 101 lb 6.4 oz (46 kg)   SpO2 99%   BMI 19.16 kg/m  GENERAL: Well-developed female no acute distress next GU: Externally normal female genitalia.  No adnexal masses or tenderness.  Cervix appears normal.  There is thick white discharge seen in the vaginal vault.  ASSESSMENT / PLAN:   Vaginal discharge Very consistent with yeast infection I will call that prescription and.  We have also performed other sexually transmitted infection testing and I will notify her of those results.  She will let me know if this does not resolve.  Pelvic cramping Pelvic cramping finally resolved after she had normal menstrual cycle   MD

## 2020-04-01 LAB — CERVICOVAGINAL ANCILLARY ONLY
Chlamydia: NEGATIVE
Comment: NEGATIVE
Comment: NORMAL
Neisseria Gonorrhea: NEGATIVE

## 2020-04-05 ENCOUNTER — Encounter: Payer: Self-pay | Admitting: Family Medicine

## 2020-04-05 NOTE — Progress Notes (Signed)
Letter sent.

## 2020-06-09 ENCOUNTER — Other Ambulatory Visit (HOSPITAL_COMMUNITY)
Admission: RE | Admit: 2020-06-09 | Discharge: 2020-06-09 | Disposition: A | Discharge status: Home / Self Care | Payer: Medicaid Other | Source: Ambulatory Visit | Attending: Family Medicine | Admitting: Family Medicine

## 2020-06-09 ENCOUNTER — Encounter: Payer: Self-pay | Admitting: Family Medicine

## 2020-06-09 ENCOUNTER — Ambulatory Visit (INDEPENDENT_AMBULATORY_CARE_PROVIDER_SITE_OTHER): Payer: Self-pay | Admitting: Family Medicine

## 2020-06-09 ENCOUNTER — Other Ambulatory Visit: Payer: Self-pay

## 2020-06-09 VITALS — BP 98/70 | HR 80 | Ht 61.0 in | Wt 103.0 lb

## 2020-06-09 DIAGNOSIS — N898 Other specified noninflammatory disorders of vagina: Secondary | ICD-10-CM

## 2020-06-09 DIAGNOSIS — Z113 Encounter for screening for infections with a predominantly sexual mode of transmission: Secondary | ICD-10-CM | POA: Insufficient documentation

## 2020-06-09 DIAGNOSIS — R102 Pelvic and perineal pain: Secondary | ICD-10-CM | POA: Diagnosis not present

## 2020-06-09 DIAGNOSIS — B373 Candidiasis of vulva and vagina: Secondary | ICD-10-CM

## 2020-06-09 DIAGNOSIS — Z1159 Encounter for screening for other viral diseases: Secondary | ICD-10-CM | POA: Diagnosis not present

## 2020-06-09 DIAGNOSIS — B3731 Acute candidiasis of vulva and vagina: Secondary | ICD-10-CM

## 2020-06-09 HISTORY — DX: Encounter for screening for infections with a predominantly sexual mode of transmission: Z11.3

## 2020-06-09 LAB — POCT URINE PREGNANCY: Preg Test, Ur: NEGATIVE

## 2020-06-09 LAB — POCT WET PREP (WET MOUNT)
Clue Cells Wet Prep Whiff POC: NEGATIVE
Trichomonas Wet Prep HPF POC: ABSENT
WBC, Wet Prep HPF POC: >20

## 2020-06-09 MED ORDER — FLUCONAZOLE 150 MG PO TABS: 150.0000 mg | ORAL_TABLET | Freq: Once | ORAL | 0 refills | Status: AC

## 2020-06-09 NOTE — Patient Instructions (Signed)
It was wonderful to meet you today. Thank you for allowing me to be a part of your care. Below is a short summary of what we discussed at your visit today:  Vaginal discharge Today we performed vaginal exam and took some swabs.  On physical exam, your discharge looked like a classic yeast infection.  We will look at the swab sample under the microscope to confirm.  I will send the prescription medication to your pharmacy.  STI screenings Today we obtained a vaginal swab for gonorrhea, chlamydia, and trichomonas along with blood samples to test for HIV and syphilis.  If everything is negative, I will send you a MyChart notification.  If anything is positive, I will give you call with the results and a plan going forward.    If you have any questions or concerns, please do not hesitate to contact us via phone or MyChart message.   Fayette Pho, MD

## 2020-06-09 NOTE — Progress Notes (Signed)
    SUBJECTIVE:   CHIEF COMPLAINT / HPI:   Vaginal complaint - vaginal pruritis and discomfort - worse last couple weeks - previous BV and yeast infection in Nov, didn't pick up medication, used leftover medication at home, doesn't think infection was fully treated - thick white vaginal discharge x 1 week - consistent with previous yeast infections  Desire for STI screening - patient reports new sexual partner (female) - no concerns for specific exposures - would simply like screening as part of keeping herself healthy - after discussion requests swab for GC, chlamydia, and trich plus blood sample for HIV and RPR - patient denies any new rashes or lesions  - no penile rashes, lesions, or discharge of new partner  PERTINENT  PMH / PSH: GERD, vaginal discharge, constipation  OBJECTIVE:   BP 98/70   Pulse 80   Ht 5\' 1"  (1.549 m)   Wt 103 lb (46.7 kg)   LMP 05/19/2020   SpO2 98%   BMI 19.46 kg/m   Physical Exam General: Awake, alert, oriented, no acute distress Respiratory: Normal work of breathing, no respiratory distress Neuro: Cranial nerves II through X grossly intact, able to move all extremities spontaneously Vulva: Normal appearing vulva without rashes, lesions, or deformities, thick white discharge at vaginal introitus Vagina: Pale pink rugated vaginal tissue without obvious lesions, thick white discharge throughout vaginal canal, physiologic discharge of whitish yellowish color present at cervical os, cervix slightly erythematous but without lesions or overt tenderness with swab, no friability of os noted   ASSESSMENT/PLAN:   Vaginal candidiasis Clinical presentation and physical exam congruent with vaginal candidiasis despite negative wet prep. Negative pregnancy test. Will treat as yeast infection with one time fluconazole. Follow up as needed.   Routine screening for STI (sexually transmitted infection) Samples collected today to screen for GC, chlamydia,  trichomonas, HIV, and RPR. Will follow up results.     05/21/2020, MD [facility]

## 2020-06-09 NOTE — Assessment & Plan Note (Signed)
Samples collected today to screen for GC, chlamydia, trichomonas, HIV, and RPR. Will follow up results.

## 2020-06-09 NOTE — Assessment & Plan Note (Signed)
Clinical presentation and physical exam congruent with vaginal candidiasis despite negative wet prep. Negative pregnancy test. Will treat as yeast infection with one time fluconazole. Follow up as needed.

## 2020-06-10 ENCOUNTER — Encounter: Payer: Self-pay | Admitting: Family Medicine

## 2020-06-10 LAB — RPR: RPR Ser Ql: NONREACTIVE

## 2020-06-10 LAB — HIV ANTIBODY (ROUTINE TESTING W REFLEX): HIV Screen 4th Generation wRfx: NONREACTIVE

## 2020-06-12 LAB — CERVICOVAGINAL ANCILLARY ONLY
Bacterial Vaginitis (gardnerella): NEGATIVE
Chlamydia: NEGATIVE
Comment: NEGATIVE
Comment: NEGATIVE
Comment: NEGATIVE
Comment: NORMAL
Neisseria Gonorrhea: NEGATIVE
Trichomonas: NEGATIVE

## 2020-07-15 ENCOUNTER — Encounter: Payer: Self-pay | Admitting: Family Medicine

## 2020-07-15 ENCOUNTER — Ambulatory Visit (INDEPENDENT_AMBULATORY_CARE_PROVIDER_SITE_OTHER): Payer: Medicaid Other | Admitting: Family Medicine

## 2020-07-15 ENCOUNTER — Other Ambulatory Visit: Payer: Self-pay

## 2020-07-15 ENCOUNTER — Ambulatory Visit: Payer: Medicaid Other

## 2020-07-15 VITALS — BP 110/60 | HR 86 | Ht 61.0 in | Wt 104.2 lb

## 2020-07-15 DIAGNOSIS — Z3202 Encounter for pregnancy test, result negative: Secondary | ICD-10-CM

## 2020-07-15 DIAGNOSIS — N898 Other specified noninflammatory disorders of vagina: Secondary | ICD-10-CM

## 2020-07-15 DIAGNOSIS — B373 Candidiasis of vulva and vagina: Secondary | ICD-10-CM | POA: Diagnosis present

## 2020-07-15 LAB — POCT URINE PREGNANCY: Preg Test, Ur: NEGATIVE

## 2020-07-15 LAB — POCT WET PREP (WET MOUNT)
Clue Cells Wet Prep Whiff POC: NEGATIVE
Trichomonas Wet Prep HPF POC: ABSENT

## 2020-07-15 MED ORDER — METRONIDAZOLE 500 MG PO TABS: 500.0000 mg | ORAL_TABLET | Freq: Two times a day (BID) | ORAL | 0 refills | Status: DC

## 2020-07-15 MED ORDER — FLUCONAZOLE 150 MG PO TABS: 150.0000 mg | ORAL_TABLET | Freq: Once | ORAL | 0 refills | Status: AC

## 2020-07-15 NOTE — Progress Notes (Deleted)
    SUBJECTIVE:   CHIEF COMPLAINT / HPI:    PERTINENT  PMH / PSH:   OBJECTIVE:   There were no vitals taken for this visit.  ***  ASSESSMENT/PLAN:   No problem-specific Assessment & Plan notes found for this encounter.     Samantha N Beard, DO [facility]   {    This will disappear when note is signed, click to select method of visit    :1}  

## 2020-07-15 NOTE — Patient Instructions (Signed)
It was wonderful to see you today.  I will send you a MyChart message with your results when they return.  Please make sure when you are sexually active the use condoms as prevent STDs.  To help prevent bacterial vaginosis in the future make sure that you are drinking plenty of water, wear loose clothing, and cotton underwear.  Avoid any harsh soaps in your vaginal area.

## 2020-07-15 NOTE — Progress Notes (Signed)
    SUBJECTIVE:   CHIEF COMPLAINT / HPI: "Feels like bacterial infection"   Erin Wilkins is a 25 year old female presenting as a same-day appointment for evaluation of vaginal discharge, she is ultimately concerned for BV.  She reports a change in vaginal odor and consistency since last week after intercourse.  Yellow discharge.  She has been using leftover metronidazole vaginal gel for the past two days to see if this would help, has helped some.  She is due to start her menstrual cycle around 3/13.  Denies any associated vaginal itching or irritation.  No associated fever, dysuria, abdominal pain, dyspareunia.  She was last seen for vaginal discharge in 06/2020 and diagnosed with vaginal candidiasis, Rx Diflucan at that time.  Her STD screening was negative at that time.  She has been with the same partner since then and does not want any STD screening today.  PERTINENT  PMH / PSH: BV/yeast vaginitis, GERD  OBJECTIVE:   BP 110/60   Pulse 86   Ht 5\' 1"  (1.549 m)   Wt 104 lb 3.2 oz (47.3 kg)   LMP 06/19/2020   SpO2 99%   BMI 19.69 kg/m   General: Alert, NAD HEENT: NCAT, MMM Lungs: No increased WOB  Ext: Warm, dry Pelvic exam: VULVA: normal appearing vulva with no masses, tenderness or lesions, VAGINA: normal appearing vagina with yellow thick discharge and concurrent bloody discharge, normal vaginal color, no lesions, CERVIX: cervical motion tenderness absent.  Pelvic exam chaperoned by CMA.  ASSESSMENT/PLAN:   Vaginal discharge Clinical history and exam overall consistent with probable BV, although wet prep not confirmatory with recent MetroGel use.  Rx'd oral Flagyl X 7 days and posttreatment Diflucan.  No STD screening per patient preference, however and persistent despite treatment should obtain this on follow-up visit.    Follow-up if not improving or sooner if worsening.  06/21/2020, DO [facility]

## 2020-07-15 NOTE — Assessment & Plan Note (Signed)
Clinical history and exam overall consistent with probable BV, although wet prep not confirmatory with recent MetroGel use.  Rx'd oral Flagyl X 7 days and posttreatment Diflucan.  No STD screening per patient preference, however and persistent despite treatment should obtain this on follow-up visit.

## 2020-07-15 NOTE — Progress Notes (Signed)
b

## 2020-07-18 ENCOUNTER — Other Ambulatory Visit: Payer: Self-pay | Admitting: Family Medicine

## 2020-07-18 DIAGNOSIS — N898 Other specified noninflammatory disorders of vagina: Secondary | ICD-10-CM

## 2020-07-18 MED ORDER — METRONIDAZOLE 0.75 % VA GEL
1.0000 | Freq: Two times a day (BID) | VAGINAL | 0 refills | Status: AC
Start: 2020-07-18 — End: 2020-07-25

## 2020-09-06 NOTE — Progress Notes (Signed)
    SUBJECTIVE:   Chief compliant/HPI: annual examination  Erin Wilkins is a 25 y.o. who presents today for an annual exam.   Review of systems form notable for  Blurred vision after sitting at computer all day. Works at a [employer] for pregnancy - UPT today   Updated history tabs and problem list.   OBJECTIVE:   BP 98/68   Pulse (!) 117   Wt 104 lb (47.2 kg)   LMP 08/12/2020   SpO2 99%   BMI 19.65 kg/m   General: NAD, non-toxic, well-appearing, sitting comfortably in chair    HEENT: [place]/AT. PERRLA. EOMI.  Cardiovascular: RRR, normal S1, S2. B/L 2+ RP. no BLEE Respiratory: CTAB. No IWOB.  Abdomen: + BS. NT, ND, soft to palpation.  Extremities: Warm and well perfused. Moving spontaneously.  Integumentary: No obvious rashes, lesions, trauma on general exam. Neuro:CN grossly intact. No FND   ASSESSMENT/PLAN:   Encounter for counseling regarding contraception UPT per patient request. Her next cycle expected 09/09/20. Would like to get nexplanon. Scheduled for next Wednesday    Annual Examination  See AVS for age appropriate recommendations.   PHQ score 0, reviewed and discussed. Blood pressure reviewed and at goal.  Asked about intimate partner violence and patient reports no violence, feels safe .  The patient currently uses nothing for contraception. Folate recommended as appropriate, minimum of 400 mcg per day. Patient would like to reinsert nexplanon  Advanced directives    Considered the following items based upon USPSTF recommendations: HIV testing: discussed Hepatitis C: discussed Hepatitis B: discussed Syphilis if at high risk: discussed GC/CT recently performed, negative Lipid panel (nonfasting or fasting) discussed based upon AHA recommendations and not ordered.  Consider repeat every 4-6 years.  Reviewed risk factors for latent tuberculosis and not indicated  Discussed family history, BRCA testing not indicated.  Cervical cancer screening: prior Pap  reviewed, repeat due in 2024, NIL Immunizations: COVID due - declines today    Follow up in 1 year or sooner if indicated for annual physical  Scheduled for nexplanon next Wednesday.  UPT negative today. LMP 08/12/20   Wilber Oliphant, MD [place]

## 2020-09-07 ENCOUNTER — Encounter: Payer: Self-pay | Admitting: Family Medicine

## 2020-09-07 ENCOUNTER — Ambulatory Visit (INDEPENDENT_AMBULATORY_CARE_PROVIDER_SITE_OTHER): Payer: Self-pay | Admitting: Family Medicine

## 2020-09-07 ENCOUNTER — Other Ambulatory Visit: Payer: Self-pay

## 2020-09-07 VITALS — BP 98/68 | HR 117 | Wt 104.0 lb

## 2020-09-07 DIAGNOSIS — Z113 Encounter for screening for infections with a predominantly sexual mode of transmission: Secondary | ICD-10-CM | POA: Insufficient documentation

## 2020-09-07 DIAGNOSIS — Z0000 Encounter for general adult medical examination without abnormal findings: Secondary | ICD-10-CM

## 2020-09-07 DIAGNOSIS — Z3009 Encounter for other general counseling and advice on contraception: Secondary | ICD-10-CM

## 2020-09-07 DIAGNOSIS — N912 Amenorrhea, unspecified: Secondary | ICD-10-CM

## 2020-09-07 LAB — POCT URINE PREGNANCY: Preg Test, Ur: NEGATIVE

## 2020-09-07 NOTE — Assessment & Plan Note (Signed)
UPT per patient request. Her next cycle expected 09/09/20. Would like to get nexplanon. Scheduled for next Wednesday

## 2020-09-12 ENCOUNTER — Telehealth: Payer: Self-pay | Admitting: Family Medicine

## 2020-09-12 NOTE — Telephone Encounter (Signed)
Patient has appointment for nexplanon insertion on Wednesday 09/14/20. She had a negative pregnancy test in office last week but took an at home test yesterday 05/08 and it was positive. We have kept her appointment for 09/14/20. Patient will now need to confirm pregnancy at appointment.  Please advise.

## 2020-09-12 NOTE — Telephone Encounter (Signed)
Patient should keep appointment for formal pregnancy testing.

## 2020-09-13 ENCOUNTER — Ambulatory Visit: Payer: Medicaid Other | Admitting: Family Medicine

## 2020-09-13 ENCOUNTER — Ambulatory Visit (INDEPENDENT_AMBULATORY_CARE_PROVIDER_SITE_OTHER): Payer: Self-pay | Admitting: Family Medicine

## 2020-09-13 ENCOUNTER — Encounter: Payer: Self-pay | Admitting: Family Medicine

## 2020-09-13 ENCOUNTER — Other Ambulatory Visit: Payer: Self-pay

## 2020-09-13 VITALS — BP 108/64 | HR 83 | Wt 105.6 lb

## 2020-09-13 DIAGNOSIS — Z3201 Encounter for pregnancy test, result positive: Secondary | ICD-10-CM

## 2020-09-13 DIAGNOSIS — Z3200 Encounter for pregnancy test, result unknown: Secondary | ICD-10-CM

## 2020-09-13 LAB — POCT URINE PREGNANCY: Preg Test, Ur: POSITIVE — AB

## 2020-09-13 NOTE — Patient Instructions (Signed)
Please be sure to call back to schedule appointment.  Dr. Salvadore Dom

## 2020-09-13 NOTE — Assessment & Plan Note (Signed)
Unable to perform nexplanon today due to +UPT. Patient unsure if she will continue pregnancy. Will call office later this week to schedule new OB appt or received other resources depending on her desires.

## 2020-09-13 NOTE — Progress Notes (Signed)
    SUBJECTIVE:   CHIEF COMPLAINT / HPI:   Erin Wilkins is a 25 yo F who presents for the following  Missed period/Nexplanon LMP 08/12/20. Took a pregnancy test last week and was negative. HPT positive Sunday. No current form of conception and would like the nexplanon.   PERTINENT  PMH / PSH: Non contributory  OBJECTIVE:   BP 108/64   Pulse 83   Wt 105 lb 9.6 oz (47.9 kg)   LMP 08/12/2020 (Exact Date)   SpO2 98%   BMI 19.95 kg/m   General: Appears well, no acute distress. Age appropriate. Respiratory: normal effort Psych: normal affect  Results for orders placed or performed in visit on 09/13/20 (from the past 24 hour(s))  POCT urine pregnancy     Status: Abnormal   Collection Time: 09/13/20 11:30 AM  Result Value Ref Range   Preg Test, Ur Positive (A) Negative   ASSESSMENT/PLAN:   Positive pregnancy test Unable to perform nexplanon today due to +UPT. Patient unsure if she will continue pregnancy. Will call office later this week to schedule new OB appt or received other resources depending on her desires.   Lavonda Jumbo, DO [facility]

## 2020-09-14 ENCOUNTER — Ambulatory Visit: Payer: Medicaid Other | Admitting: Family Medicine

## 2020-09-19 ENCOUNTER — Ambulatory Visit: Payer: Self-pay

## 2020-09-19 NOTE — Telephone Encounter (Signed)
Pt has not had BM in 2 weeks. Pt stated she has a h/o chronic constipation. Pt does not take Miralax, laxatives or glycerin or Dulcolax suppositories.  Pt strains with BM's. Denies any blood on stool or on toilet paper. Denies rectal pain. Pt stated stool is hard.  Pt is six weeks pregnant.  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 Pt was previously recommended to take daily Miralax but pt is not compliant. Pt does not take new medications or narcotic pain med.  Care advice given and pt verbalized understanding. Advised pt to call her PCP for further advice/appointment.  Pt voiced understanding.    Reason for Disposition . Last bowel movement (BM) > 4 days ago  Answer Assessment - Initial Assessment Questions 1. STOOL PATTERN OR FREQUENCY: "How often do you have a bowel movement (BM)?"  (Normal range: 3 times a day to every 3 days)  "When was your last BM?"      Last 2 weeks ago 2. STRAINING: "Do you have to strain to have a BM?"      yes 3. RECTAL PAIN: "Does your rectum hurt when the stool comes out?" If Yes, ask: "Do you have hemorrhoids? How bad is the pain?"  (Scale 1-10; or mild, moderate, severe)     No/ no hemorrhoids 4. STOOL COMPOSITION: "Are the stools hard?"      yes 5. BLOOD ON STOOLS: "Has there been any blood on the toilet tissue or on the surface of the BM?" If Yes, ask: "When was the last time?"      no 6. CHRONIC CONSTIPATION: "Is this a new problem for you?"  If no, ask: "How long have you had this problem?" (days, weeks, months)      No-years  7. CHANGES IN DIET OR HYDRATION: "Have there been any recent changes in your diet?" "How much fluids are you drinking on a daily basis?"  "How much have you had to drink today?"     No/bottle tall smart waters/ 1  Glass of lemonade 8. MEDICATIONS: "Have you been taking any new medications?" "Are you taking any narcotic pain medications?" (e.g., Vicodin, Percocet, morphine, Dilaudid)     No/no 9.  LAXATIVES: "Have you been using any stool softeners, laxatives, or enemas?"  If yes, ask "What, how often, and when was the last time?"     No/was prescribed Mira lax but pt does not take it 10. 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 CAUSE: "What do you think is causing the constipation?"        no 12. OTHER SYMPTOMS: "Do you have any other symptoms?" (e.g., abdominal pain, bloating, fever, vomiting)       no 13. MEDICAL HISTORY: "Do you have a history of hemorrhoids, rectal fissures, or rectal surgery or rectal abscess?"         no 14. PREGNANCY: "Is there any chance you are pregnant?" "When was your last menstrual period?"       Yes 6 weeks  Protocols used: CONSTIPATION-A-AH

## 2020-09-24 ENCOUNTER — Inpatient Hospital Stay (HOSPITAL_COMMUNITY)
Admission: AD | Admit: 2020-09-24 | Discharge: 2020-09-24 | Disposition: A | Discharge status: Home / Self Care | Payer: Medicaid Other | Attending: Obstetrics and Gynecology | Admitting: Obstetrics and Gynecology

## 2020-09-24 ENCOUNTER — Other Ambulatory Visit: Payer: Self-pay

## 2020-09-24 ENCOUNTER — Inpatient Hospital Stay (HOSPITAL_COMMUNITY): Payer: Medicaid Other

## 2020-09-24 ENCOUNTER — Encounter (HOSPITAL_COMMUNITY): Payer: Self-pay | Admitting: Obstetrics and Gynecology

## 2020-09-24 DIAGNOSIS — Z3A01 Less than 8 weeks gestation of pregnancy: Secondary | ICD-10-CM | POA: Insufficient documentation

## 2020-09-24 DIAGNOSIS — O209 Hemorrhage in early pregnancy, unspecified: Secondary | ICD-10-CM | POA: Diagnosis not present

## 2020-09-24 DIAGNOSIS — N939 Abnormal uterine and vaginal bleeding, unspecified: Secondary | ICD-10-CM | POA: Diagnosis not present

## 2020-09-24 DIAGNOSIS — O4690 Antepartum hemorrhage, unspecified, unspecified trimester: Secondary | ICD-10-CM

## 2020-09-24 DIAGNOSIS — O26851 Spotting complicating pregnancy, first trimester: Secondary | ICD-10-CM

## 2020-09-24 LAB — URINALYSIS, ROUTINE W REFLEX MICROSCOPIC
Bilirubin Urine: NEGATIVE
Glucose, UA: NEGATIVE mg/dL
Ketones, ur: NEGATIVE mg/dL
Nitrite: NEGATIVE
Protein, ur: NEGATIVE mg/dL
Specific Gravity, Urine: 1.026 (ref 1.005–1.030)
pH: 6 (ref 5.0–8.0)

## 2020-09-24 LAB — CBC
HCT: 36.3 % (ref 36.0–46.0)
Hemoglobin: 12.6 g/dL (ref 12.0–15.0)
MCH: 31.6 pg (ref 26.0–34.0)
MCHC: 34.7 g/dL (ref 30.0–36.0)
MCV: 91 fL (ref 80.0–100.0)
Platelets: 264 10*3/uL (ref 150–400)
RBC: 3.99 MIL/uL (ref 3.87–5.11)
RDW: 12.6 % (ref 11.5–15.5)
WBC: 10.1 10*3/uL (ref 4.0–10.5)
nRBC: 0 % (ref 0.0–0.2)

## 2020-09-24 LAB — HIV ANTIBODY (ROUTINE TESTING W REFLEX): HIV Screen 4th Generation wRfx: NONREACTIVE

## 2020-09-24 LAB — HCG, QUANTITATIVE, PREGNANCY: hCG, Beta Chain, Quant, S: 128689 m[IU]/mL — ABNORMAL HIGH (ref <5)

## 2020-09-24 NOTE — MAU Note (Signed)
Awaiting family member to pick up pts son so that she can be taken to Ultrasound. Pt continues to not have an further vaginal bleeding.Denies cramping or contractions

## 2020-09-24 NOTE — MAU Provider Note (Addendum)
History     CSN: [id number]  Arrival date and time: 09/24/20 1516     Chief Complaint  Patient presents with  . Vaginal Bleeding   [name] is a 25 y.o. year old G4P1011 female at [redacted]w[redacted]d weeks gestation who presents to MAU reporting spotting noted about 30 minutes prior to her arrival in MAU after getting out of the shower. She reports she has recurrent BV. She noticed a vaginal discharge with an odor, so she inserted some of "the cream" I was prescribed for it before; currently in vagina now. She states the spotting occurred after she removed the applicator from the cream.   OB History    Gravida  3   Para  1   Term  1   Preterm      AB  1   Living  1     SAB      IAB  1   Ectopic      Multiple  0   Live Births  1           Past Medical History:  Diagnosis Date  . Anemia   . Irregular uterine bleeding 04/24/2018  . UTI (urinary tract infection)   . Vaginal candidiasis 07/08/2019    Past Surgical History:  Procedure Laterality Date  . NO PAST SURGERIES      Family History  Problem Relation Age of Onset  . Hypertension Mother     Social History   Tobacco Use  . Smoking status: Never Smoker  . Smokeless tobacco: Never Used  Vaping Use  . Vaping Use: Never used  Substance Use Topics  . Alcohol use: No  . Drug use: No    Allergies:  Allergies  Allergen Reactions  . Benadryl [Diphenhydramine] Hives  . Ibuprofen   . Amoxicillin Hives and Rash    Rxn about 1 yr ago  . Ancef [Cefazolin] Rash    Perioral rash  . Penicillins Hives and Rash    Has patient had a PCN reaction causing immediate rash, facial/tongue/throat swelling, SOB or lightheadedness with hypotension: No Has patient had a PCN reaction causing severe rash involving mucus membranes or skin necrosis: No Has patient had a PCN reaction that required hospitalization: No Has patient had a PCN reaction occurring within the last 10 years: Yes If all of the above answers are "NO",  then may proceed with Cephalosporin use.     No medications prior to admission.    Review of Systems  Constitutional: Negative.   HENT: Negative.   Eyes: Negative.   Respiratory: Negative.   Cardiovascular: Negative.   Gastrointestinal: Negative.   Endocrine: Negative.   Genitourinary: Positive for vaginal bleeding (spotting after inserting cream for BV) and vaginal discharge (with odor).  Musculoskeletal: Negative.   Skin: Negative.   Allergic/Immunologic: Negative.   Neurological: Negative.   Hematological: Negative.   Psychiatric/Behavioral: Negative.    Physical Exam   Blood pressure 117/67, pulse 96, temperature 98.3 F (36.8 C), resp. rate 17, height 5\' 1"  (1.549 m), weight 47.4 kg, last menstrual period 08/12/2020, SpO2 100 %.  Physical Exam  MAU Course  Procedures  MDM HCG CBC HIV Wet Prep -- order cancelled d/t vaginal cream inserted prior to arrival to MAU GC/CT -- order cancelled d/t vaginal cream inserted prior to arrival to MAU  Results for orders placed or performed during the hospital encounter of 09/24/20 (from the past 24 hour(s))  CBC     Status: None  Collection Time: 09/24/20  3:35 PM  Result Value Ref Range   WBC 10.1 4.0 - 10.5 K/uL   RBC 3.99 3.87 - 5.11 MIL/uL   Hemoglobin 12.6 12.0 - 15.0 g/dL   HCT 01.0 93.2 - 35.5 %   MCV 91.0 80.0 - 100.0 fL   MCH 31.6 26.0 - 34.0 pg   MCHC 34.7 30.0 - 36.0 g/dL   RDW 73.2 20.2 - 54.2 %   Platelets 264 150 - 400 K/uL   nRBC 0.0 0.0 - 0.2 %  hCG, quantitative, pregnancy     Status: Abnormal   Collection Time: 09/24/20  3:35 PM  Result Value Ref Range   hCG, Beta Chain, Quant, S 128,689 (H) <5 mIU/mL  HIV Antibody (routine testing w rflx)     Status: None   Collection Time: 09/24/20  3:35 PM  Result Value Ref Range   HIV Screen 4th Generation wRfx Non Reactive Non Reactive    US OB LESS THAN 14 WEEKS WITH OB TRANSVAGINAL  Result Date: 09/24/2020 CLINICAL DATA:  Vaginal bleeding. 6 weeks 1 day  gestational age by LMP. EXAM: OBSTETRIC <14 WK [place] AND TRANSVAGINAL OB US TECHNIQUE: Both transabdominal and transvaginal ultrasound examinations were performed for complete evaluation of the gestation as well as the maternal uterus, adnexal regions, and pelvic cul-de-sac. Transvaginal technique was performed to assess early pregnancy. COMPARISON:  None. FINDINGS: Intrauterine gestational sac: Single Yolk sac:  Visualized. Embryo:  Visualized. Cardiac Activity: Visualized. Heart Rate: 121 bpm CRL:  5 mm   6 w   1 d                  [place] EDC: 05/19/2021 Subchorionic hemorrhage:  None visualized. Maternal uterus/adnexae: Both ovaries are normal appearance. Small right ovarian corpus luteum noted. No adnexal mass or abnormal free fluid identified. IMPRESSION: Single living IUP with estimated gestational age of [redacted] weeks 1 day, and [place] EDC of 05/19/2021. No maternal uterine or adnexal abnormality identified. Electronically Signed   By: Danae Orleans M.D.   On: 09/24/2020 17:36     Report given to and care assumed by Gerrit Heck, CNM @ [address] CNM 09/24/2020, 4:59 PM  Assessment and Plan   Reassessment (6:07 PM) -Patient informed of results.  -Informed that vaginal bleeding likely from cervical sensitivity and insertion of metro gel.  -Discussed normal physiological changes that may cause cervical sensitivity and occasional spotting. -Reassured that recurrent BV affects a lot of people and that during pregnancy treatment is limited to vaginal or oral administration. -Given list of community providers. -Encouraged to call or return to MAU if symptoms worsen or with the onset of new symptoms. -Discharged to home in stable condition.  Cherre Robins MSN, CNM Advanced Practice Provider, [facility] [employer] [facility]

## 2020-09-24 NOTE — MAU Note (Signed)
Pt reports she has a history of BV and place applicator full of cream in shower and this is when she noticed red blood on applicator.

## 2020-09-24 NOTE — MAU Note (Signed)
Pt reports no additional vaginal bleeding or spotting after urine collection or when arrived to room.

## 2020-09-24 NOTE — Discharge Instructions (Signed)
Bacterial Vaginosis  Bacterial vaginosis is an infection that occurs when the normal balance of bacteria in the vagina changes. This change is caused by an overgrowth of certain bacteria in the vagina. Bacterial vaginosis is the most common vaginal infection among females aged 25 to 44 years. This condition increases the risk of sexually transmitted infections (STIs). Treatment can help reduce this risk. Treatment is very important for pregnant women because this condition can cause babies to be born early (prematurely) or at a low birth weight. What are the causes? This condition is caused by an increase in harmful bacteria that are normally present in small amounts in the vagina. However, the exact reason this condition develops is not known. You cannot get bacterial vaginosis from toilet seats, bedding, swimming pools, or contact with objects around you. What increases the risk? The following factors may make you more likely to develop this condition:  Having a new sexual partner or multiple sexual partners, or having unprotected sex.  Douching.  Having an intrauterine device (IUD).  Smoking.  Abusing drugs and alcohol. This may lead to riskier sexual behavior.  Taking certain antibiotic medicines.  Being pregnant. What are the signs or symptoms? Some women with this condition have no symptoms. Symptoms may include:  Gray or white vaginal discharge. The discharge can be watery or foamy.  A fish-like odor with discharge, especially after sex or during menstruation.  Itching in and around the vagina.  Burning or pain with urination. How is this diagnosed? This condition is diagnosed based on:  Your medical history.  A physical exam of the vagina.  Checking a sample of vaginal fluid for harmful bacteria or abnormal cells. How is this treated? This condition is treated with antibiotic medicines. These may be given as a pill, a vaginal cream, or a medicine that is put into the  vagina (suppository). If the condition comes back after treatment, a second round of antibiotics may be needed. Follow these instructions at home: Medicines  Take or apply over-the-counter and prescription medicines only as told by your health care provider.  Take or apply your antibiotic medicine as told by your health care provider. Do not stop using the antibiotic even if you start to feel better. General instructions  If you have a female sexual partner, tell her that you have a vaginal infection. She should follow up with her health care provider. If you have a female sexual partner, he does not need treatment.  Avoid sexual activity until you finish treatment.  Drink enough fluid to keep your urine pale yellow.  Keep the area around your vagina and rectum clean. ? Wash the area daily with warm water. ? Wipe yourself from front to back after using the toilet.  If you are breastfeeding, talk to your health care provider about continuing breastfeeding during treatment.  Keep all follow-up visits. This is important. How is this prevented? Self-care  Do not douche.  Wash the outside of your vagina with warm water only.  Wear cotton or cotton-lined underwear.  Avoid wearing tight pants and pantyhose, especially during the summer. Safe sex  Use protection when having sex. This includes: ? Using condoms. ? Using dental dams. This is a thin layer of a material made of latex or polyurethane that protects the mouth during oral sex.  Limit the number of sexual partners. To help prevent bacterial vaginosis, it is best to have sex with just one partner (monogamous relationship).  Make sure you and your sexual partner   are tested for STIs. Drugs and alcohol  Do not use any products that contain nicotine or tobacco. These products include cigarettes, chewing tobacco, and vaping devices, such as e-cigarettes. If you need help quitting, ask your health care provider.  Do not use  drugs.  Do not drink alcohol if: ? Your health care provider tells you not to do this. ? You are pregnant, may be pregnant, or are planning to become pregnant.  If you drink alcohol: ? Limit how much you have to 0-1 drink a day. ? Be aware of how much alcohol is in your drink. In the [place] one drink equals one 12 oz bottle of beer (355 mL), one 5 oz glass of wine (148 mL), or one 1 oz glass of hard liquor (44 mL). Where to find more information  Centers for Disease Control and Prevention: FootballExhibition.com.br  American Sexual Health Association (ASHA): www.ashastd.org  [place] Department of Health and Health and safety inspector, Office on Women's Health: http://hoffman.com/ Contact a health care provider if:  Your symptoms do not improve, even after treatment.  You have more discharge or pain when urinating.  You have a fever or chills.  You have pain in your abdomen or pelvis.  You have pain during sex.  You have vaginal bleeding between menstrual periods. Summary  Bacterial vaginosis is a vaginal infection that occurs when the normal balance of bacteria in the vagina changes. It results from an overgrowth of certain bacteria.  This condition increases the risk of sexually transmitted infections (STIs). Getting treated can help reduce this risk.  Treatment is very important for pregnant women because this condition can cause babies to be born early (prematurely) or at low birth weight.  This condition is treated with antibiotic medicines. These may be given as a pill, a vaginal cream, or a medicine that is put into the vagina (suppository). This information is not intended to replace advice given to you by your health care provider. Make sure you discuss any questions you have with your health care provider. Document Revised: 10/22/2019 Document Reviewed: 10/22/2019 Elsevier Patient Education  2021 Elsevier Inc.   [facility] for [facility] at [facility]  [address], [place], [place] [address]  [phone]  [facility]  [address], [place], [place] [address]  [phone]  [facility] for [facility] at [facility] [address], [place], [place] [address]  [phone]  [facility] for [facility] at [facility]  [address] [place], [place] [address]  [phone]  Center for [facility] at [facility]  [address]First floor), [place], [place] [address]  [phone]  Center for [facility] [address], [place], [place] [address] [phone]  [facility] for [facility] at [facility]  [address] [place], [place], [place] [address]  [phone]  [facility]  [address], [place], [place] [address]  [phone]  [facility]  [address] [place], [place], [place] [address]  [phone]  [facility]  [address] [place], [place] [address]  [phone]  [facility]  [address] [place], [place] [address]  [phone]  [facility]  [address], [place], [place] [address]  [phone]  [facility]   [address] [place], [place] [address]  [phone]  [facility]  [address] [place] [place], [place] [address]   [phone]  [facility]  [address] [place], [place] [address]  [phone]

## 2020-09-24 NOTE — MAU Note (Signed)
Presents with c/o VB that began 30 minutes ago, states spotting. Denies abdominal pain/cramping.  LMP 08/12/2020.

## 2020-09-30 ENCOUNTER — Encounter: Payer: Medicaid Other | Admitting: Family Medicine

## 2020-09-30 NOTE — Progress Notes (Deleted)
Patient Name: Erin Wilkins Date of Birth: 12-05-1995 [facility] Initial Prenatal Visit  Litisha Guagliardo is a 25 y.o. year old G3P1011 at [redacted]w[redacted]d who presents for her initial prenatal visit. Pregnancy is not planned She reports {pregnancy symptoms:18128}. She {is/is not:320031::"is"} taking a prenatal vitamin.  She denies pelvic pain or vaginal bleeding.   Pregnancy Dating: . The patient is dated by LMP.  . LMP: *** . Period is certain:  {yes/no:20286}.  Marland Kitchen Periods were regular:  {yes/no:20286}.  Marland Kitchen LMP was a typical period:  {yes/no:20286}.  Marland Kitchen Using hormonal contraception in 3 months prior to conception: {yes/no:20286}  Lab Review: . Blood type: O POS . Rh Status: {Rh status :23298::"+"} . Antibody screen: {NEGATIVE/POSITIVE FOR:19998::"Negative"} . HIV: {NEGATIVE/POSITIVE FOR:19998::"Negative"} . RPR: {NEGATIVE/APPRO/POSITIVE FOR:20006::"Negative"} . Hemoglobin electrophoresis reviewed: {yes/no:20286::"Yes"} . Results of OB urine culture are: {NEGATIVE/POSITIVE FOR:19998::"Negative"} . Rubella: {Desc; immune/not/unknown:31571::"Immune"} . Hep C Ab: {NEGATIVE/POSITIVE FOR:19998::"Negative"} . Varicella status is {Desc; immune/not/unknown:31571::"Immune"}  PMH: Reviewed and as detailed below: . HTN: {yes/no:20286::"No"}  . Gestational Hypertension/preeclampsia: {yes/no:20286::"No"}  . Type 1 or 2 Diabetes: {yes/no:20286::"No"}  . Depression:  {yes/no:20286::"No"}  . Seizure disorder:  {yes/no:20286::"No"} . VTE: {yes/no:20286::"No"} ,  . History of STI {yes/no:20286::"No"},  . Abnormal Pap smear:  {yes/no:20286::"No"}, . Genital herpes simplex:  {yes/no:20286::"No"}   PSH: . Gynecologic Surgery:  {No/  **:31982:o:"no"} . Surgical history reviewed, notable for: ***  Obstetric History: . Obstetric history tab updated and reviewed.  . Summary of prior pregnancies: *** . Cesarean delivery: {yes/no:20286::"No"}  . Gestational Diabetes:   {yes/no:20286::"No"} . Hypertension in pregnancy: {yes/no:20286::"No"} . History of preterm birth: {yes/no:20286::"No"} . History of LGA/SGA infant:  {yes/no:20286::"No"} . History of shoulder dystocia: {yes/no:20286::"No"} . Indications for referral were reviewed, and the patient has no obstetric indications for referral to [facility] at this time.   Social History: . Partner's name: ***  . Tobacco use: {yes/no:20286::"No"} . Alcohol use:  {yes/no:20286::"No"} . Other substance use:  {yes/no:20286::"No"}  Current Medications:  . ***  . Reviewed and appropriate in pregnancy.   Genetic and Infection Screen: . Flow Sheet Updated {yes/no:20286::"Yes"}  Prenatal Exam: Gen: Well nourished, well developed.  No distress.  Vitals noted. HEENT: Normocephalic, atraumatic.  Neck supple without cervical lymphadenopathy, thyromegaly or thyroid nodules.  Fair dentition. CV: RRR no murmur, gallops or rubs Lungs: CTA B.  Normal respiratory effort without wheezes or rales. Abd: soft, NTND. +BS.  Uterus not appreciated above pelvis. GU: Normal external female genitalia without lesions.  Nl vaginal, well rugated without lesions. No vaginal discharge.  Bimanual exam: No adnexal mass or TTP. No CMT.  Uterus size *** Ext: No clubbing, cyanosis or edema. Psych: Normal grooming and dress.  Not depressed or anxious appearing.  Normal thought content and process without flight of ideas or looseness of associations  Fetal heart tones: {appropriate:23337::"Appropriate"}  Assessment/Plan:  Kariya Lavergne is a 25 y.o. G3P1011 at [redacted]w[redacted]d who presents to initiate prenatal care. She is doing well.  Current pregnancy issues include ***.  1. Routine prenatal care: Marland Kitchen As dating {ACTION; IS/IS NOT:21021397::"is not"} reliable, a dating ultrasound {HAS HAS NOT:18834::"has"} been ordered. Dating tab updated. . Pre-pregnancy weight updated. Expected weight gain this pregnancy is {weight gain pregnancy :23296::"25-35  pounds "} . Prenatal labs reviewed, notable for ***. . Indications for referral to HROB were reviewed and the patient {DOES NOT does:27190::"does not"} meet criteria for referral.  . Medication list reviewed and updated.  . Recommended patient see a dentist for regular care.  . Bleeding  and pain precautions reviewed. . Importance of prenatal vitamins reviewed.  . Genetic screening offered. Patient opted for: {obgeneticscreen:23414}. . The patient {DOES NOT does:27190::"does not"} have an indication for aspirin therapy beginning at 12-16 weeks. Aspirin {WAS/WAS [phone]::"was not"}  recommended today.  . The patient {will/will not be:23415} age 31 or over at time of delivery. Referral to genetic counseling {WAS/WAS [phone]::"was not"} offered today.  . The patient has the following risk factors for preexisting diabetes: {Pre-existing diabetes screening:23343::"Reviewed indications for early 1 hour glucose testing, not indicated "}. An early 1 hour glucose tolerance test {WAS/WAS [phone]::"was not"} ordered. . Pregnancy Medical Home and PHQ-9 forms completed, problems noted: {yes/no:20286}  2. Pregnancy issues include the following which were addressed today:  . ***   Follow up 4 weeks for next prenatal visit.

## 2020-10-18 ENCOUNTER — Encounter: Payer: Self-pay | Admitting: Family Medicine

## 2020-10-18 ENCOUNTER — Other Ambulatory Visit: Payer: Self-pay

## 2020-10-18 ENCOUNTER — Ambulatory Visit (INDEPENDENT_AMBULATORY_CARE_PROVIDER_SITE_OTHER): Payer: Self-pay | Admitting: Family Medicine

## 2020-10-18 VITALS — BP 108/72 | HR 82 | Wt 105.5 lb

## 2020-10-18 DIAGNOSIS — T300 Burn of unspecified body region, unspecified degree: Secondary | ICD-10-CM | POA: Insufficient documentation

## 2020-10-18 DIAGNOSIS — X12XXXA Contact with other hot fluids, initial encounter: Secondary | ICD-10-CM

## 2020-10-18 DIAGNOSIS — R300 Dysuria: Secondary | ICD-10-CM | POA: Insufficient documentation

## 2020-10-18 NOTE — Patient Instructions (Signed)
Burn Care, Adult A burn is an injury to the skin or the tissues under the skin that is caused by a fire, hot liquid, chemical, or electricity. There are three types of burns: First degree. These burns may cause the skin to be red and slightly swollen. These burns do not blister or scar. Second degree. These burns are very painful and cause the skin to be very red. The skin may also swell, leak fluid, look shiny, and develop blisters. Third degree. These burns cause permanent damage. They either turn the skin white or black and make it look charred, dry, and leathery. These burns may not be painful due to damage to the nerve endings. Treatment for your burn will depend on the type of burn you have. Taking care of your burn properly can help to prevent pain and infection. It can also helpthe burn heal more quickly. How to care for a first-degree burn Right after a burn: Rinse or soak the burn under cool water for 5 minutes or more. Do not put ice on your burn. This can cause more damage. Apply a cool, clean, wet cloth (cool compress) to your skin. This may help with pain. Put lotion or gel with aloe vera on your skin. This may help soothe the burn. Caring for the burn Follow instructions from your health care provider about cleaning and caring for the burn. This may include: Using mild soap and water to clean the area. Using a clean cloth to pat the burned area dry after cleaning it. Do not rub or scrub the burn. Applying lotion or gel with aloe vera to your skin. How to care for a second-degree burn Right after a burn: Rinse or soak the burn under cool water. Do this for 5 to 10 minutes. Do not put ice on your burn. This can cause more damage. Remove any jewelry near the burned area. Lightly cover the burn with a clean cloth. Caring for the burn Raise (elevate) the injured area above the level of your heart while sitting or lying down. Follow instructions from your health care provider about  cleaning and caring for the burn. This may include: Cleaning or rinsing out (irrigating) the burned area. Putting a cream or ointment on the burn. Placing a germ-free (sterile) dressing over the burn. A dressing is a material that is placed over a burn to help it heal. How to care for a third-degree burn Right after a burn: Lightly cover the burn with a clean, dry cloth. Seek treatment right away if you have this kind of burn. You may: Require admission to the hospital. Be treated with surgery to remove damaged tissue or to place a skin graft to cover the damaged area. Be given IV fluids to keep you hydrated. Caring for the burn Follow instructions from your health care provider about cleaning and caring for the burn. This may include: Cleaning or rinsing out (irrigating) the burned area. Putting a cream or ointment on the burn. Placing a sterile dressing in the burned area (packing). Placing a sterile dressing over the burn. Other instructions Elevate the injured area above the level of your heart while sitting or lying down. Wear splints or immobilizers as instructed by your health care provider. Rest as told by your health care provider. Do not participate in sports or other physical activities until your health care provider approves. How to prevent infection when caring for a burn  Take these steps to prevent infection: Wash your hands with soap   and water for at least 20 seconds before and after burn care. If soap and water are not available, use hand sanitizer. Wear clean or sterile gloves as directed by your health care provider. Do not put butter, oil, toothpaste, or other home remedies on the burn. Do not scratch or pick at the burn. Do not break any blisters. Do not peel the skin. Do not rub your burn, even when you are cleaning it. Check your burn every day for these signs of infection: More redness, swelling, or pain. Warmth. Pus or a bad smell. Red streaks around the  burn. Follow these instructions at home  Medicines Take over-the-counter and prescription medicines only as told by your health care provider. If you were prescribed an antibiotic medicine, take or apply it as told by your health care provider. Do not stop using the antibiotic even if your condition improves. Your health care provider may recommend taking over-the-counter or prescription pain medicine before changing your dressing. General instructions Protect your burn from the sun. Drink enough fluid to keep your urine pale yellow. Do not use any products that contain nicotine or tobacco, such as cigarettes, e-cigarettes, and chewing tobacco. These can delay healing. If you need help quitting, ask your health care provider. Keep all follow-up visits as told by your health care provider. This is important. Contact a health care provider if: Your condition does not improve. Your condition gets worse. You have a fever or chills. Your burn feels warm to the touch. You have more redness, swelling, or pain at the site of the burn. Your burn changes in appearance or develops black or red spots. You have pain that is not controlled with medicine. Get help right away if you have: More fluid, blood, or pus coming from your burn. Red streaks near the burn. Severe pain. Summary There are three types of burns. They are first degree, second degree, and third degree. The most severe type of burn is a third-degree burn which must be treated right away. Treatment for your burn will depend on the type of burn you have. Do not put butter, oil, toothpaste, or other home remedies on the burn. This can cause more damage to the tissue. Follow instructions from your health care provider about how to clean and take care of your burn. This information is not intended to replace advice given to you by your health care provider. Make sure you discuss any questions you have with your healthcare provider. Document  Revised: 02/10/2019 Document Reviewed: 02/10/2019 Elsevier Patient Education  2022 Elsevier Inc.  
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
## 2020-10-18 NOTE — Assessment & Plan Note (Signed)
Patient with sensation of bladder spasm at the end of her urination that started 4 days ago after having therapeutic abortion.  No other concerns for back pain, fevers, chills, body aches, urinary frequency, urinary urgency, bladder pain, or incontinence. -Patient can continue to monitor -Strict return precautions provided

## 2020-10-26 ENCOUNTER — Ambulatory Visit: Payer: Self-pay | Admitting: Family Medicine

## 2020-11-02 ENCOUNTER — Inpatient Hospital Stay (HOSPITAL_COMMUNITY)
Admission: AD | Admit: 2020-11-02 | Discharge: 2020-11-02 | Disposition: A | Discharge status: Home / Self Care | Payer: Medicaid Other | Attending: Obstetrics & Gynecology | Admitting: Obstetrics & Gynecology

## 2020-11-02 ENCOUNTER — Other Ambulatory Visit: Payer: Self-pay

## 2020-11-02 ENCOUNTER — Other Ambulatory Visit: Payer: Self-pay | Admitting: Certified Nurse Midwife

## 2020-11-02 DIAGNOSIS — Z3202 Encounter for pregnancy test, result negative: Secondary | ICD-10-CM | POA: Diagnosis not present

## 2020-11-02 DIAGNOSIS — Z7251 High risk heterosexual behavior: Secondary | ICD-10-CM | POA: Diagnosis not present

## 2020-11-02 LAB — POCT PREGNANCY, URINE: Preg Test, Ur: NEGATIVE

## 2020-11-02 LAB — HCG, QUANTITATIVE, PREGNANCY: hCG, Beta Chain, Quant, S: 79 m[IU]/mL — ABNORMAL HIGH (ref <5)

## 2020-11-02 MED ORDER — NORGESTIMATE-ETH ESTRADIOL 0.25-35 MG-MCG PO TABS: 1.0000 | ORAL_TABLET | Freq: Every day | ORAL | 1 refills | Status: DC

## 2020-11-02 NOTE — MAU Provider Note (Addendum)
S [name] is a 25 y.o. G40P1011 female who presents to MAU today with complaint of +HPT. Reports having a medical termination on 10/06/20. She had bleeding and spotting until 2 days ago. She also had unprotected IC about 2 weeks ago. She took a pregnancy test today and was positive. Denies pain or VB today.    ROS: No VB No abd pain  O BP 134/73 (BP Location: Right Arm)   Pulse 76   Temp 98.4 F (36.9 C) (Oral)   Resp 19   Ht 5\' 1"  (1.549 m)   Wt 47.9 kg   SpO2 100%   BMI 19.97 kg/m  Physical Exam HENT:     Head: Normocephalic and atraumatic.  Cardiovascular:     Rate and Rhythm: Normal rate.  Pulmonary:     Effort: Pulmonary effort is normal. No respiratory distress.  Musculoskeletal:        General: Normal range of motion.     Cervical back: Normal range of motion.  Neurological:     General: No focal deficit present.     Mental Status: She is alert and oriented to person, place, and time.  Psychiatric:        Mood and Affect: Mood normal.        Behavior: Behavior normal.   Results for orders placed or performed during the hospital encounter of 11/02/20 (from the past 24 hour(s))  Pregnancy, urine POC     Status: None   Collection Time: 11/02/20  1:01 PM  Result Value Ref Range   Preg Test, Ur NEGATIVE NEGATIVE  hCG, quantitative, pregnancy     Status: Abnormal   Collection Time: 11/02/20  1:23 PM  Result Value Ref Range   hCG, Beta Chain, Quant, S 79 (H) <5 mIU/mL   MDM: UPT negative, quant ordered. Will call pt with results.  Consult with Dr. 11/04/20, qhcg 79, unlikely new pregnancy, will follow quant in office, and offer contraception. 1515: Pt informed of results, f/u qhcg scheduled, confirmed pt received appt info via MyChart. Offered OCPs, pt accepts, recommend condoms too for backup during first pack. Rx sent  A 1. Negative pregnancy test    P Discharge from MAU in stable condition Follow up at [facility] 11/04/20 @0900  Rx Sprintec Warning signs for worsening  condition that would warrant emergency follow-up discussed Patient may return to MAU as needed for pregnancy related complaints  01/05/21, CNM 11/02/2020 3:20 PM

## 2020-11-02 NOTE — MAU Note (Signed)
Presents stating she had a +HPT, concerned if pregnant secondary terminated pregnancy on October 06, 2020, but has had unprotected sex and missed f/u appt after termination.

## 2020-11-03 NOTE — Patient Instructions (Addendum)
Will communicate results via Mychart or phone.   Please call the clinic at [phone] if you have any concerns. It was our pleasure to serve you.  Dr. Salvadore Dom

## 2020-11-03 NOTE — Progress Notes (Signed)
    SUBJECTIVE:   CHIEF COMPLAINT / HPI:   [name] is a 25 yo F following up for the issues below.   Follow up after elective pregnancy termination See in MAU 6/29 following termination of pregnant 6/2. Resumed sexually activity and reports no use of contraception. Home pregnancy test was positive 6/29. Negative UPT 6/29 in MAU with evidence of declining bHCG. Continued concern of new pregnancy.   PERTINENT  PMH / PSH: Non-contributory  OBJECTIVE:   BP (!) 115/92   Pulse 96   Wt 105 lb 6.4 oz (47.8 kg)   SpO2 95%   BMI 19.92 kg/m   General: Appears well, no acute distress. Age appropriate. Respiratory: normal effort  ASSESSMENT/PLAN:   Termination of pregnancy (fetus) Termination stated to be 6/2. Negative UPT 6/29. UPT Positive today. -f/u bHCG -Discussed birth control (condoms for now); not likely new pregnancy so advised to pick up birth control plan to call patient when hcg available. Voiced she would like nexplanon and discussed making appointment for that. Patient agreed with plan.    Erin Jumbo, DO [facility]

## 2020-11-04 ENCOUNTER — Other Ambulatory Visit: Payer: Self-pay

## 2020-11-04 ENCOUNTER — Ambulatory Visit (INDEPENDENT_AMBULATORY_CARE_PROVIDER_SITE_OTHER): Payer: Medicaid Other | Admitting: Family Medicine

## 2020-11-04 VITALS — BP 115/92 | HR 96 | Wt 105.4 lb

## 2020-11-04 DIAGNOSIS — Z332 Encounter for elective termination of pregnancy: Secondary | ICD-10-CM | POA: Diagnosis not present

## 2020-11-04 LAB — POCT URINE PREGNANCY: Preg Test, Ur: POSITIVE — AB

## 2020-11-04 NOTE — Progress Notes (Signed)
Pt did not keep follow-up appt for stat beta HCG today. Per chart review, pt has been seen at family medicine today for same issue. Pt may follow up with our office as needed.  Fleet Contras RN 11/04/20

## 2020-11-05 DIAGNOSIS — Z332 Encounter for elective termination of pregnancy: Secondary | ICD-10-CM | POA: Insufficient documentation

## 2020-11-05 NOTE — Assessment & Plan Note (Signed)
Termination stated to be 6/2. Negative UPT 6/29. UPT Positive today. -f/u bHCG -Discussed birth control (condoms for now); not likely new pregnancy so advised to pick up birth control plan to call patient when hcg available. Voiced she would like nexplanon and discussed making appointment for that. Patient agreed with plan.

## 2020-11-08 ENCOUNTER — Telehealth: Payer: Self-pay | Admitting: Family Medicine

## 2020-11-08 LAB — HUMAN CHORIONIC GONADOTROPIN(HCG),B-SUBUNIT,QUANTITATIVE): HCG, Beta Chain, Quant, S: 35 m[IU]/mL

## 2020-11-08 NOTE — Telephone Encounter (Signed)
Called patient to discuss declining bHCG. Likely continued declined from previous elective termination of pregnancy. Stressed the use of condoms or OCPs prior to patient obtain desired nexplanon. Directed patient to call clinic to schedule placement of nexplanon.   Juanmiguel Defelice Autry-Lott, DO 11/08/2020, 9:05 AM PGY-3, [facility]

## 2020-11-09 ENCOUNTER — Ambulatory Visit: Payer: Medicaid Other

## 2020-11-10 ENCOUNTER — Ambulatory Visit (INDEPENDENT_AMBULATORY_CARE_PROVIDER_SITE_OTHER): Payer: Medicaid Other | Admitting: Family Medicine

## 2020-11-10 ENCOUNTER — Other Ambulatory Visit: Payer: Self-pay

## 2020-11-10 VITALS — BP 104/69 | HR 86

## 2020-11-10 DIAGNOSIS — W57XXXA Bitten or stung by nonvenomous insect and other nonvenomous arthropods, initial encounter: Secondary | ICD-10-CM

## 2020-11-10 DIAGNOSIS — J029 Acute pharyngitis, unspecified: Secondary | ICD-10-CM | POA: Diagnosis not present

## 2020-11-10 NOTE — Patient Instructions (Addendum)
It was wonderful to see you today.  Your sore throat may be due to a viral illness that would likely get better on its own or allergies.  Please let us know if you develop fever cough or congestion or runny eyes or red eyes.  The bumps on your arm and legs look like mosquito or bug bites. You can use hydrocortisone cream if continues to be itchy.   In the meantime please make your appointment to get your Nexplanon.  Please call the clinic at [phone] if your symptoms worsen or you have any concerns. It was our pleasure to serve you.  Dr. Salvadore Dom

## 2020-11-10 NOTE — Progress Notes (Signed)
    SUBJECTIVE:   CHIEF COMPLAINT / HPI:   Erin Wilkins is a 25 yo F who presents for the below issue  Sore throat x1 day. Getting better today. Scratchiness occurred with drinking soda. This occurs every summer and usually self resolves. Denies fever, nausea, vomiting, chest pain, shortness of breath, and congestion.   PERTINENT  PMH / PSH: Non-contributory  OBJECTIVE:   BP 104/69   Pulse 86   SpO2 100%   General: Appears well, no acute distress. Age appropriate. Respiratory: normal effort Throat:   Lips, mucosa, and tongue normal; teeth and gums normal. Oropharynx unremarkable.    ASSESSMENT/PLAN:    1. Sore throat 1 day of illness, improving. No concerning physical findings or symptoms. Discussed supportive care and return to care if symptoms fail to resolve, worsen, or if patient develops new symptoms of infection as discussed above. Likely self limiting throat irritation.     Erin Jumbo, DO [facility]

## 2020-11-28 NOTE — Progress Notes (Deleted)
    SUBJECTIVE:   CHIEF COMPLAINT / HPI:   Throat discomfort:  Birth control:  PERTINENT  PMH / PSH: ***  OBJECTIVE:   There were no vitals taken for this visit.  ***  ASSESSMENT/PLAN:   No problem-specific Assessment & Plan notes found for this encounter.     Shirlean Mylar, MD [facility]

## 2020-11-29 ENCOUNTER — Ambulatory Visit: Payer: Medicaid Other

## 2021-02-08 ENCOUNTER — Ambulatory Visit: Payer: Medicaid Other | Admitting: Student

## 2021-02-08 NOTE — Progress Notes (Deleted)
    SUBJECTIVE:   CHIEF COMPLAINT / HPI:   Vaginal Discharge: Patient is a 24 y.o. female presenting with vaginal discharge for *** days.  She states the discharge is of *** consistency.  She endorses *** vaginal odor.  She is not interested in screening for sexually transmitted infections today.  PERTINENT  PMH / PSH: ***None relevant  OBJECTIVE:   There were no vitals taken for this visit.   General: NAD, pleasant, able to participate in exam Respiratory: Normal effort, no obvious respiratory distress Pelvic: VULVA: normal appearing vulva with no masses, tenderness or lesions, VAGINA: Normal appearing vagina with normal color, no lesions, with {GYN VAGINAL DISCHARGE:21986} discharge present, ***CERVIX: No lesions, {GYN VAGINAL DISCHARGE:21986} discharge present,  Chaperone *** present for pelvic exam  ASSESSMENT/PLAN:   No problem-specific Assessment & Plan notes found for this encounter.    Assessment:  24 y.o. female with vaginal discharge for***days, as well as***.  Physical exam significant for*** discharge.  Wet prep performed today shows *** consistent with ***.  Patient is not interested in STI screening.   Plan: -Wet prep as above.  Will treat with***. -Follow-up as needed  Destry Bezdek, MD [facility]   

## 2021-03-09 ENCOUNTER — Other Ambulatory Visit (HOSPITAL_COMMUNITY)
Admission: RE | Admit: 2021-03-09 | Discharge: 2021-03-09 | Disposition: A | Discharge status: Home / Self Care | Payer: Medicaid Other | Source: Ambulatory Visit | Attending: Family Medicine | Admitting: Family Medicine

## 2021-03-09 ENCOUNTER — Other Ambulatory Visit: Payer: Self-pay

## 2021-03-09 ENCOUNTER — Ambulatory Visit (INDEPENDENT_AMBULATORY_CARE_PROVIDER_SITE_OTHER): Payer: Medicaid Other | Admitting: Family Medicine

## 2021-03-09 VITALS — BP 100/60

## 2021-03-09 DIAGNOSIS — B9689 Other specified bacterial agents as the cause of diseases classified elsewhere: Secondary | ICD-10-CM

## 2021-03-09 DIAGNOSIS — N760 Acute vaginitis: Secondary | ICD-10-CM

## 2021-03-09 DIAGNOSIS — Z7251 High risk heterosexual behavior: Secondary | ICD-10-CM

## 2021-03-09 DIAGNOSIS — B379 Candidiasis, unspecified: Secondary | ICD-10-CM

## 2021-03-09 DIAGNOSIS — Z3A01 Less than 8 weeks gestation of pregnancy: Secondary | ICD-10-CM | POA: Diagnosis not present

## 2021-03-09 DIAGNOSIS — N898 Other specified noninflammatory disorders of vagina: Secondary | ICD-10-CM | POA: Diagnosis not present

## 2021-03-09 LAB — POCT WET PREP (WET MOUNT)
Clue Cells Wet Prep Whiff POC: POSITIVE
Trichomonas Wet Prep HPF POC: ABSENT

## 2021-03-09 LAB — POCT URINE PREGNANCY: Preg Test, Ur: POSITIVE — AB

## 2021-03-09 MED ORDER — METRONIDAZOLE 500 MG PO TABS: 500.0000 mg | ORAL_TABLET | Freq: Two times a day (BID) | ORAL | 0 refills | Status: AC

## 2021-03-09 MED ORDER — MICONAZOLE NITRATE 2 % EX CREA: 1.0000 "application " | TOPICAL_CREAM | Freq: Every day | CUTANEOUS | 0 refills | Status: AC

## 2021-03-09 NOTE — Progress Notes (Signed)
        SUBJECTIVE:   CHIEF COMPLAINT / HPI:   Chief Complaint  Patient presents with   Vaginal Discharge    Erin Wilkins is a 25 y.o. female presents for vaginal discharge.   Vaginal Discharge Having vaginal discharge for few days. Discharge consistency: thick Discharge color: white No recent antibiotic use  - Denies odor, itching, burning, abdominal pain, dysuria or hematuria, nausea or vomiting, fevers or pelvic pain.  - Patient's last menstrual period was 02/08/2021. Has not been using condoms as she was not sexually active.  Possible STD exposure: no  Symptoms Fever: no Dysuria:no Vaginal bleeding: no Abdomen or Pelvic pain: no Back pain: no Genital sores or ulcers:no Rash: no Pain during sex: no Odor : no   PERTINENT  PMH / PSH: reviewed and updated as appropriate   OBJECTIVE:   BP 100/60   LMP 02/08/2021   GEN: well appearing female in no acute distress  CVS: well perfused  RESP: speaking in full sentences without pause  ABD: soft, non-tender, non-distended, no palpable masses, uterus below the umbilicus  Pelvic exam: normal external genitalia, vulva, VAGINA with thick white discharge and CERVIX: without  lesions, ADNEXA: normal adnexa in size, nontender and no masses, WET MOUNT done - results: , exam chaperoned by CMA Jazmin.  KOH done, clue cells, excessive bacteria, DNA probe for chlamydia and GC obtained, yeast    ASSESSMENT/PLAN:   Pregnancy less than 8 weeks Had an abortion about 5 months ago. Notes she did have negative pregnancy test since. Pt unsure if she will keep this pregnancy. Advised to call clinic to schedule initial prenatal visit. Patient's last menstrual period was 02/08/2021. Declined initial prenatal labs. Advised to start prenatal vitamins. If she decides to get an abortion, start birth control afterwards.  Yeast Infection  Treat with Miconazole topically for 7 days.   BV (bacterial vaginosis)  Confirmed on wet prep. G/C  [name] is pending. Symptoms consistent with this.  - Treatment: Flagyl 500 BID x 7 days. Advised patient to not drink alcohol while taking this medication.  - F/U if symptoms not improving or getting worse.  - Self care instructions given including avoiding douching. Handout given.  - Return precautions including abdominal pain, fever, chills, nausea, or vomiting given.   High risk heterosexual behavior GC and chlamydia DNA  probe sent to lab. HIV and RPR declined. Advised patient to use barrier protection/condoms for now.      Katha Cabal, DO [facility]

## 2021-03-09 NOTE — Patient Instructions (Signed)
It was great seeing you today!  Visit Remembers: - Stop by the pharmacy to pick up your prescriptions for bacterial vaginosis and yeast infection. - Start taking prenatal vitamins regardless of her decision. -It is your decision whether to have an abortion or not.  If you do have an abortion, stop by your pharmacy and refill your birth control pills afterwards.  - To Do: Call the clinic and schedule an initial prenatal visit if you do decide to have this baby.   Feel free to call with any questions or concerns at any time, at [phone].   Take care,  Dr. Katherina Right [facility]

## 2021-03-10 LAB — CERVICOVAGINAL ANCILLARY ONLY
Chlamydia: NEGATIVE
Comment: NEGATIVE
Comment: NORMAL
Neisseria Gonorrhea: NEGATIVE

## 2021-03-17 ENCOUNTER — Telehealth: Payer: Self-pay | Admitting: *Deleted

## 2021-03-17 DIAGNOSIS — B9689 Other specified bacterial agents as the cause of diseases classified elsewhere: Secondary | ICD-10-CM

## 2021-03-17 DIAGNOSIS — N760 Acute vaginitis: Secondary | ICD-10-CM

## 2021-03-17 NOTE — Telephone Encounter (Signed)
Pt called in wanting another medicine for her BV because it makes her sick. Now she has a yeast infection. Wants new medications sent in. Please advise. Trenell Moxey Bruna Potter, CMA

## 2021-03-18 MED ORDER — METRONIDAZOLE 0.75 % VA GEL
1.0000 | Freq: Two times a day (BID) | VAGINAL | 0 refills | Status: AC
Start: 2021-03-18 — End: 2021-03-25

## 2021-03-18 NOTE — Telephone Encounter (Signed)
Switched to Metronidazole gel BID as pt reports nausea with oral Metronidazole.   She will need an appointment for evaluation if yeast infection if sx are not involved with metronidazole.   Katha Cabal, DO

## 2021-03-29 ENCOUNTER — Other Ambulatory Visit: Payer: Self-pay | Admitting: Family Medicine

## 2021-03-29 MED ORDER — FLUCONAZOLE 150 MG PO TABS: 150.0000 mg | ORAL_TABLET | Freq: Once | ORAL | 0 refills | Status: AC

## 2021-03-29 MED ORDER — MICONAZOLE NITRATE 200 MG VA SUPP: 200.0000 mg | Freq: Every day | VAGINAL | 0 refills | Status: DC

## 2021-03-29 NOTE — Telephone Encounter (Addendum)
Miconazole sent to pharmacy. Patient can also purchase over the counter yeast infection suppository if more cost effective.   Ronnald Ramp, MD [facility], PGY-3 [phone]

## 2021-03-29 NOTE — Addendum Note (Signed)
Addended by: Bing Neighbors on: 03/29/2021 11:41 AM   Modules accepted: Orders

## 2021-03-29 NOTE — Addendum Note (Signed)
Addended by: Henri Medal on: 03/29/2021 09:47 AM   Modules accepted: Orders

## 2021-03-29 NOTE — Telephone Encounter (Signed)
Patient finished cream for BV and now has a yeast infection.  She is requesting treatment for this.  Will forward to both ordering physician and physician covering PCP.  Merci Walthers,CMA

## 2021-04-03 ENCOUNTER — Other Ambulatory Visit: Payer: Self-pay | Admitting: *Deleted

## 2021-04-03 ENCOUNTER — Other Ambulatory Visit: Payer: Medicaid Other

## 2021-04-03 DIAGNOSIS — Z3481 Encounter for supervision of other normal pregnancy, first trimester: Secondary | ICD-10-CM

## 2021-04-14 ENCOUNTER — Other Ambulatory Visit: Payer: Medicaid Other

## 2021-04-14 ENCOUNTER — Other Ambulatory Visit: Payer: Self-pay

## 2021-04-14 DIAGNOSIS — Z3481 Encounter for supervision of other normal pregnancy, first trimester: Secondary | ICD-10-CM | POA: Diagnosis not present

## 2021-04-14 NOTE — Progress Notes (Unsigned)
This RN was asked by Molly Maduro, CMA to assess patient for reported dizziness. Patient reports that she has been having intermittent dizziness since the beginning of this pregnancy. Reports that during these episodes that her heart will begin racing and "seeing stars"  Patient is not currently having any symptoms. Vital signs obtained and are below.  - BP: 108/78 -HR: 80 -SpO2: 100%  Patient reports history of anemia in previous pregnancy. New OB labs drawn today. Informed patient that doctor will reach out regarding these results. Patient also has follow up visit with Dr. Melba Coon on 12/12.  Precepted with Dr. Leveda Anna who agrees with follow up plan.   Advised patient to change positions slowly, drink plenty of fluids, and eat small, frequent meals.   Veronda Prude, RN

## 2021-04-16 LAB — URINE CULTURE, OB REFLEX

## 2021-04-16 LAB — CULTURE, OB URINE

## 2021-04-16 NOTE — Patient Instructions (Addendum)
It was wonderful to see you today.  Please bring ALL of your medications with you to every visit.   Today we talked about:  If you experience any vaginal bleeding, leakage of fluids or any pregnancy related concerns- please go directly to the Maternal Assessment Unit at [facility] for evaluation.  If your nausea worsens and you would like medication for this, please contact our office. You can usually buy Vitamin B6 over the counter and this can help. We discussed ways to help your nausea which include eating small and frequent meals/snacks, and eating crackers before getting up in the morning. Make sure to keep hydrated- if at any point you cannot keep down fluids, please go to the MAU for evaluation.   Maternity and women's care services located on the [place] side of [facility] [place]. [facility] (Entrance C off [address]  [address] [place],  [place]  [address]      Thank you for choosing [facility].   Please call [phone] with any questions about today's appointment.  Please be sure to schedule follow up at the front  desk before you leave today.   Sabino Dick, DO PGY-2 Family Medicine

## 2021-04-16 NOTE — Progress Notes (Signed)
Patient Name: Shanaya Schneck Date of Birth: 03-13-1996 [facility] Initial Prenatal Visit  Hilda Rynders is a 25 y.o. year old G3P1011 at Unknown who presents for her initial prenatal visit. Pregnancy  was not exactly  planned but it is welcomed She reports breast tenderness and nausea. She is not taking a prenatal vitamin. Prescription sent.  She denies pelvic pain or vaginal bleeding.   Pregnancy Dating: The patient is dated by LMP.  LMP: 02/08/2021 Period is certain:  Yes.  Periods were regular:  Yes.  LMP was a typical period:  Yes.  Using hormonal contraception in 3 months prior to conception: No  Lab Review: Blood type: O Rh Status: + Antibody screen: Negative HIV: Negative RPR: Negative Hemoglobin electrophoresis reviewed: No, final report is pending. Results of OB urine culture are: Negative Rubella: Immune Hep C Ab: Negative Varicella status is non-immune   PMH: Reviewed and as detailed below: HTN: No  Gestational Hypertension/preeclampsia: No  Type 1 or 2 Diabetes: No  Depression:  No  Seizure disorder:  No VTE: No ,  History of STI Yes, chlamydia and ?gonorrhea in 2021. Both treated. Abnormal Pap smear:  No, Genital herpes simplex:  No   PSH: Gynecologic Surgery:  no Surgical history reviewed, notable for: None  Obstetric History: Obstetric history tab updated and reviewed.  Summary of prior pregnancies:  #1: Spontaneous vaginal delivery at [redacted]w[redacted]d (03/10/2015) to a healthy female, 7 lbs 3.2 oz. No complications.  #2: Elective Abortion August 2021 #3: Elective Abortion 10/06/2020 #4: Current pregnancy  Cesarean delivery: No  Gestational Diabetes:  No Hypertension in pregnancy: No History of preterm birth: No History of LGA/SGA infant:  No History of shoulder dystocia: No Indications for referral were reviewed, and the patient has no obstetric indications for referral to High Risk OB Clinic at this time.   Social History: Partner's name: Jallen  Henard  Tobacco use: No Alcohol use:  No Other substance use:  No  Current Medications:  No current medications.   Genetic and Infection Screen: Flow Sheet Updated Yes  Prenatal Exam: Gen: Well nourished, well developed.  No distress.  Vitals noted. HEENT: Normocephalic, atraumatic.  Neck supple without cervical lymphadenopathy, thyromegaly or thyroid nodules.  Fair dentition. CV: RRR no murmur, gallops or rubs Lungs: CTA B.  Normal respiratory effort without wheezes or rales. Abd: soft, NTND. +BS.  Uterus not appreciated above pelvis. GU: Normal external female genitalia without lesions.  Nl vaginal, well rugated without lesions. There is yellow/green vaginal discharge present- with frothy appearance.  Bimanual exam: No adnexal mass or TTP. No CMT.  Uterus size: small Ext: No clubbing, cyanosis or edema. Psych: Normal grooming and dress.  Not depressed or anxious appearing.  Normal thought content and process without flight of ideas or looseness of associations  Fetal heart tones: too early to check.  Assessment/Plan:  Nia Nathaniel is a 25 y.o. G3P1011 at Unknown who presents to initiate prenatal care. She is doing well.  Current pregnancy issues include intermittent nausea and breast tenderness.  Routine prenatal care: As dating is reliable, a dating ultrasound has not been ordered. Dating tab updated. Pre-pregnancy weight updated. Expected weight gain this pregnancy is 25-35 pounds  Prenatal labs reviewed, notable for O positive, rubella immune, reports varicella non-immune. Indications for referral to HROB were reviewed and the patient does not meet criteria for referral.  Medication list reviewed and updated.  Recommended patient see a dentist for regular care.  Bleeding and pain precautions reviewed.  Importance of prenatal vitamins reviewed.  Genetic screening offered. Patient opted for: no screening. The patient has the following indications for aspirin to begin 81 mg at  12-16 weeks: One high risk condition: no single high risk condition  MORE than one moderate risk condition: identifies as African American  Aspirin was not  recommended today based upon above risk factors (one high risk condition or more than one moderate risk factor)  The patient will not be age 80 or over at time of delivery. Referral to genetic counseling was not offered today.  The patient has the following risk factors for preexisting diabetes: Reviewed indications for early 1 hour glucose testing, not indicated . An early 1 hour glucose tolerance test was not ordered. Pregnancy Medical Home and PHQ-9 forms completed, problems noted: Yes  2. Pregnancy issues include the following which were addressed today:  Nausea: declined medication at this point stating "it's not that bad". We discussed methods to help and return precautions.  Breast tenderness: routine  Declined Flu vaccination today.  Prescription sent for prenatal vitamins Wet prep done today, will treat as indicated. Declined further STI screening for chlamydia/gonorrhea today.  Wet prep returned positive for Trichomonas and yeast. Treatment sent for Metronidazole 500 mg BID x 7 days and miconazole 2% vaginal cream x 7 days. Patient aware and understands partner will also need to be treated to prevent reinfection.  Perform test of cure and also recommend testing for chlamydia and gonorrhea at follow up appointment.   Follow up 4 weeks for next prenatal visit.

## 2021-04-17 ENCOUNTER — Other Ambulatory Visit: Payer: Self-pay

## 2021-04-17 ENCOUNTER — Ambulatory Visit (INDEPENDENT_AMBULATORY_CARE_PROVIDER_SITE_OTHER): Payer: Medicaid Other | Admitting: Family Medicine

## 2021-04-17 ENCOUNTER — Encounter: Payer: Self-pay | Admitting: Family Medicine

## 2021-04-17 VITALS — BP 104/63 | HR 76 | Wt 107.4 lb

## 2021-04-17 DIAGNOSIS — Z3481 Encounter for supervision of other normal pregnancy, first trimester: Secondary | ICD-10-CM | POA: Diagnosis not present

## 2021-04-17 DIAGNOSIS — A599 Trichomoniasis, unspecified: Secondary | ICD-10-CM | POA: Insufficient documentation

## 2021-04-17 LAB — POCT WET PREP (WET MOUNT)
Clue Cells Wet Prep Whiff POC: NEGATIVE
WBC, Wet Prep HPF POC: >20

## 2021-04-17 MED ORDER — COMPLETENATE 29-1 MG PO CHEW: 1.0000 | CHEWABLE_TABLET | Freq: Every day | ORAL | 9 refills | Status: DC

## 2021-04-17 MED ORDER — MICONAZOLE NITRATE 2 % VA CREA: 1.0000 | TOPICAL_CREAM | Freq: Every day | VAGINAL | 0 refills | Status: AC

## 2021-04-17 MED ORDER — METRONIDAZOLE 500 MG PO TABS: 500.0000 mg | ORAL_TABLET | Freq: Two times a day (BID) | ORAL | 0 refills | Status: AC

## 2021-04-17 NOTE — Assessment & Plan Note (Signed)
Rx for Metronidazole 500 mg BID x 7 days. Educated on need for partner to be treated to prevent re-infection.Will need to do test of cure at follow up as well as screen for other STI's.

## 2021-04-18 LAB — HCV AB W REFLEX TO QUANT PCR: HCV Ab: <0.1 s/co ratio (ref 0.0–0.9)

## 2021-04-18 LAB — OBSTETRIC PANEL, INCLUDING HIV
Antibody Screen: NEGATIVE
Basophils Absolute: 0 10*3/uL (ref 0.0–0.2)
Basos: 0 %
EOS (ABSOLUTE): 0 10*3/uL (ref 0.0–0.4)
Eos: 0 %
HIV Screen 4th Generation wRfx: NONREACTIVE
Hematocrit: 34.9 % (ref 34.0–46.6)
Hemoglobin: 11.6 g/dL (ref 11.1–15.9)
Hepatitis B Surface Ag: NEGATIVE
Immature Grans (Abs): 0 10*3/uL (ref 0.0–0.1)
Immature Granulocytes: 0 %
Lymphocytes Absolute: 1.8 10*3/uL (ref 0.7–3.1)
Lymphs: 20 %
MCH: 30.7 pg (ref 26.6–33.0)
MCHC: 33.2 g/dL (ref 31.5–35.7)
MCV: 92 fL (ref 79–97)
Monocytes Absolute: 0.7 10*3/uL (ref 0.1–0.9)
Monocytes: 8 %
Neutrophils Absolute: 6.7 10*3/uL (ref 1.4–7.0)
Neutrophils: 72 %
Platelets: 303 10*3/uL (ref 150–450)
RBC: 3.78 x10E6/uL (ref 3.77–5.28)
RDW: 13.2 % (ref 11.7–15.4)
RPR Ser Ql: NONREACTIVE
Rh Factor: POSITIVE
Rubella Antibodies, IGG: 3.13 index (ref >0.99)
WBC: 9.3 10*3/uL (ref 3.4–10.8)

## 2021-04-18 LAB — HGB FRACTIONATION CASCADE
Hgb A2: 2.7 % (ref 1.8–3.2)
Hgb A: 96.9 % (ref 96.4–98.8)
Hgb F: 0.4 % (ref 0.0–2.0)
Hgb S: 0 %

## 2021-04-18 LAB — HCV INTERPRETATION

## 2021-04-20 ENCOUNTER — Telehealth: Payer: Self-pay | Admitting: *Deleted

## 2021-04-20 ENCOUNTER — Other Ambulatory Visit: Payer: Self-pay | Admitting: Family Medicine

## 2021-04-20 MED ORDER — ONDANSETRON HCL 4 MG PO TABS: 4.0000 mg | ORAL_TABLET | Freq: Three times a day (TID) | ORAL | 0 refills | Status: DC | PRN

## 2021-04-20 MED ORDER — DOXYLAMINE-PYRIDOXINE 10-10 MG PO TBEC: 1.0000 | DELAYED_RELEASE_TABLET | Freq: Every evening | ORAL | 1 refills | Status: DC

## 2021-04-20 NOTE — Telephone Encounter (Signed)
Patient calls because she can not keep the flagyl down. States she could not swallow pills before she was pregnant and is even worse now.  She wants to know if there is something else she can do for treatment.  To Dr. Melba Coon. Jone Baseman, CMA

## 2021-04-28 ENCOUNTER — Encounter: Payer: Self-pay | Admitting: Family Medicine

## 2021-04-28 ENCOUNTER — Other Ambulatory Visit: Payer: Self-pay | Admitting: Family Medicine

## 2021-04-28 MED ORDER — TINIDAZOLE 500 MG PO TABS: 2.0000 g | ORAL_TABLET | Freq: Once | ORAL | 0 refills | Status: AC

## 2021-04-28 NOTE — Telephone Encounter (Signed)
Patient calls nurse line reporting continued issues with Flagyl. Patient reports she has tried zofran, however continues to "throw up" metronidazole. Patient is requesting an alternative medication at this point. Will forward to provider.

## 2021-04-28 NOTE — Progress Notes (Signed)
Sent Rx for Tinidazole 2 mg x1 with instructions to take Zofran 4 mg 30 minutes prior and another 4 mg right before as patient has had difficulty keeping flagyl down 2/2 nausea. Attempted to contact patient x2 unsuccessfully.

## 2021-04-28 NOTE — Telephone Encounter (Signed)
Tried to call patient twice, unsuccessful. MyChart message sent. 84m Tinidazole sent with instructions to take Zofran 30 minutes prior and right before taking medication.

## 2021-05-01 NOTE — Progress Notes (Deleted)
°  Patient Name: Erin Wilkins Date of Birth: [birth year] [facility] Prenatal Visit  Erin Wilkins is a 25 y.o. [id number] at [redacted]w[redacted]d here for routine follow up. She is dated by LMP.  She reports {symptoms; pregnancy related:14538}.  She denies vaginal bleeding.  See flow sheet for details.  There were no vitals filed for this visit.  A/P: Pregnancy at [redacted]w[redacted]d.  Doing well.    Routine Prenatal Care:  Dating reviewed, dating tab is {correct:23336::"correct"} Fetal heart tones {appropriate:23337} Influenza vaccine {given:23340}  COVID vaccination was discussed and ***.  The patient has the following indication for screening preexisting diabetes: {Pre-existing diabetes screening:23343::"Reviewed indications for early 1 hour glucose testing, not indicated "}. Anatomy ultrasound ordered to be scheduled at 18-20 weeks. Patient {is/is not:9024} interested in genetic screening. As she is past 13 weeks and 6 days, a {quad:23339::"Quad screen "} was offered.  Pregnancy education including expected weight gain in pregnancy, OTC medication use, continued use of prenatal vitamin, smoking cessation if applicable, and nutrition in pregnancy.   Bleeding and pain precautions reviewed. The patient has the following indications for aspirinto begin 81 mg at 12-16 weeks: One high risk condition: {fmcaspirinobhigh:26167} MORE than one moderate risk condition: {fmcaspirinobmoderate:26168} Aspirin {WAS/WAS NOT:[phone]::"was not"}  recommended today based upon above risk factors (one high risk condition or more than one moderate risk factor)   2. Pregnancy issues include the following and were addressed as appropriate today:  Trichomonas infection: Diagnosed at initial prenatal visit 12/12. Difficulties with antibiotics 2/2 nausea/vomiting, despite pre-medication with Zofran. Was eventually able to complete 4 tablets of Tinidazole***. TOC today.  Nausea/vomiting Problem list  and pregnancy box updated:  {yes/no:20286::"Yes"}.   Follow up 4 weeks.

## 2021-05-03 ENCOUNTER — Inpatient Hospital Stay (HOSPITAL_COMMUNITY)
Admission: AD | Admit: 2021-05-03 | Discharge: 2021-05-03 | Disposition: A | Discharge status: Home / Self Care | Payer: Medicaid Other | Attending: Obstetrics & Gynecology | Admitting: Obstetrics & Gynecology

## 2021-05-03 ENCOUNTER — Encounter (HOSPITAL_COMMUNITY): Payer: Self-pay | Admitting: Obstetrics & Gynecology

## 2021-05-03 ENCOUNTER — Other Ambulatory Visit: Payer: Self-pay

## 2021-05-03 DIAGNOSIS — O219 Vomiting of pregnancy, unspecified: Secondary | ICD-10-CM

## 2021-05-03 DIAGNOSIS — O26891 Other specified pregnancy related conditions, first trimester: Secondary | ICD-10-CM | POA: Insufficient documentation

## 2021-05-03 DIAGNOSIS — A599 Trichomoniasis, unspecified: Secondary | ICD-10-CM

## 2021-05-03 DIAGNOSIS — Z3A12 12 weeks gestation of pregnancy: Secondary | ICD-10-CM | POA: Diagnosis not present

## 2021-05-03 DIAGNOSIS — R1010 Upper abdominal pain, unspecified: Secondary | ICD-10-CM | POA: Diagnosis not present

## 2021-05-03 LAB — URINALYSIS, ROUTINE W REFLEX MICROSCOPIC
Bilirubin Urine: NEGATIVE
Glucose, UA: NEGATIVE mg/dL
Hgb urine dipstick: NEGATIVE
Ketones, ur: 80 mg/dL — AB
Nitrite: NEGATIVE
Protein, ur: 30 mg/dL — AB
Specific Gravity, Urine: 1.029 (ref 1.005–1.030)
pH: 5 (ref 5.0–8.0)

## 2021-05-03 MED ORDER — FAMOTIDINE 20 MG PO TABS: 20.0000 mg | ORAL_TABLET | Freq: Two times a day (BID) | ORAL | 0 refills | Status: DC

## 2021-05-03 MED ORDER — METOCLOPRAMIDE HCL 10 MG PO TABS: 10.0000 mg | ORAL_TABLET | Freq: Four times a day (QID) | ORAL | 0 refills | Status: DC

## 2021-05-03 MED ORDER — METOCLOPRAMIDE HCL 5 MG/ML IJ SOLN
10.0000 mg | Freq: Once | INTRAMUSCULAR | Status: AC
  Administered 2021-05-03: 11:00:00 10 mg via INTRAVENOUS
  Filled 2021-05-03: qty 2

## 2021-05-03 MED ORDER — LACTATED RINGERS IV BOLUS
1000.0000 mL | Freq: Once | INTRAVENOUS | Status: AC
  Administered 2021-05-03: 11:00:00 1000 mL via INTRAVENOUS

## 2021-05-03 MED ORDER — FAMOTIDINE IN NACL 20-0.9 MG/50ML-% IV SOLN
20.0000 mg | Freq: Once | INTRAVENOUS | Status: AC
  Administered 2021-05-03: 11:00:00 20 mg via INTRAVENOUS
  Filled 2021-05-03: qty 50

## 2021-05-03 NOTE — MAU Note (Signed)
Erin Wilkins is a 25 y.o. at [redacted]w[redacted]d here in MAU reporting: states yesterday she ate a cough drop and has been vomiting since. Has had 5-6 episodes of vomiting. Also having upper abdominal pain.  Onset of complaint: yesterday  Pain score: 7/10  Vitals:   05/03/21 1011  BP: 115/76  Pulse: (!) 106  Resp: 16  Temp: 98.9 F (37.2 C)  SpO2: 97%     FHT:168  Lab orders placed from triage: UA

## 2021-05-03 NOTE — MAU Provider Note (Signed)
History     CSN: [id number]  Arrival date and time: 05/03/21 0950  None    Chief Complaint  Patient presents with   Abdominal Pain   Nausea   HPI Erin Wilkins is a 25 y.o. G4P1021 at [redacted]w[redacted]d by LMP who presents to MAU for nausea and vomiting. Patient reports nausea and vomiting started yesterday after she ate a cough drop. Tried to drink water, eat ice, and applesauce which she could not keep down. Also reports having a metallic taste in her mouth which makes nausea worse. Has had about 5-6 episodes of vomiting in the past 24 hours. She reports she tried taking Zofran yesterday morning, but it doesn't help. Sleeping is the only thing that helps. She also endorses some upper abdominal pain and heartburn; has not taken anything to relieve symptoms. She denies lower abdominal pain, vaginal bleeding, urinary s/s, fever, or diarrhea.  Of note, she was recently treated for trichomonas, vomited up the flagyl she was prescribed so was given tinidazole which she was able to tolerate.   OB History     Gravida  4   Para  1   Term  1   Preterm      AB  2   Living  1      SAB      IAB  2   Ectopic      Multiple  0   Live Births  1           Past Medical History:  Diagnosis Date   Anemia    Irregular uterine bleeding 04/24/2018   UTI (urinary tract infection)    Vaginal candidiasis 07/08/2019    Past Surgical History:  Procedure Laterality Date   NO PAST SURGERIES      Family History  Problem Relation Age of Onset   Hypertension Mother     Social History   Tobacco Use   Smoking status: Never   Smokeless tobacco: Never  Vaping Use   Vaping Use: Never used  Substance Use Topics   Alcohol use: No   Drug use: No   Allergies:  Allergies  Allergen Reactions   Benadryl [Diphenhydramine] Hives   Amoxicillin Hives and Rash    Rxn about 1 yr ago   Ancef [Cefazolin] Rash    Perioral rash   Ibuprofen Rash   Penicillins Hives and Rash    Has patient had a PCN  reaction causing immediate rash, facial/tongue/throat swelling, SOB or lightheadedness with hypotension: No Has patient had a PCN reaction causing severe rash involving mucus membranes or skin necrosis: No Has patient had a PCN reaction that required hospitalization: No Has patient had a PCN reaction occurring within the last 10 years: Yes If all of the above answers are "NO", then may proceed with Cephalosporin use.     No medications prior to admission.    Review of Systems  Constitutional: Negative.   Respiratory: Negative.    Cardiovascular: Negative.   Gastrointestinal:  Positive for abdominal pain, nausea and vomiting. Negative for diarrhea.  Genitourinary: Negative.   Musculoskeletal: Negative.   Neurological: Negative.    Physical Exam   Blood pressure 112/68, pulse 88, temperature 98.9 F (37.2 C), temperature source Oral, resp. rate 16, height 5\' 1"  (1.549 m), weight 47.8 kg, last menstrual period 02/08/2021, SpO2 97 %, unknown if currently breastfeeding.  Physical Exam Vitals and nursing note reviewed.  Constitutional:      General: She is not in acute distress.  Appearance: She is normal weight.  Cardiovascular:     Rate and Rhythm: Tachycardia present.  Pulmonary:     Effort: Pulmonary effort is normal.  Abdominal:     Palpations: Abdomen is soft.     Tenderness: There is no abdominal tenderness.  Skin:    General: Skin is warm and dry.  Neurological:     General: No focal deficit present.     Mental Status: She is alert and oriented to person, place, and time.  Psychiatric:        Mood and Affect: Mood normal.        Behavior: Behavior normal.   FHT: 168 bpm via doppler  MAU Course  Procedures UA, culture pending IVF bolus, IV Reglan, IV Pepcid  MDM UA >80 ketones, many bacteria/leukocytes, culture pending IVF bolus, IV Reglan and Pepcid given.  PO challenge, patient tolerated well  Assessment and Plan  [redacted] weeks gestation of pregnancy Nausea  and vomiting during pregnancy  - Discharge home in stable condition - Rx for Pepcid and Reglan sent to pharmacy - Keep OB appointment as scheduled on 05/15/2021 - Return to MAU as needed or for worsening symptoms   Brand Males, CNM 05/03/2021, 12:27 PM

## 2021-05-04 LAB — CULTURE, OB URINE: Culture: >=100000 — AB

## 2021-05-15 ENCOUNTER — Encounter: Payer: Medicaid Other | Admitting: Family Medicine

## 2021-06-02 ENCOUNTER — Ambulatory Visit: Payer: Medicaid Other

## 2021-06-02 NOTE — Progress Notes (Deleted)
° ° °  SUBJECTIVE:   CHIEF COMPLAINT / HPI:  No chief complaint on file.   ***  PERTINENT  PMH / PSH: ***  Patient Care Team: Towanda Octave, MD as PCP - General (Family Medicine)   OBJECTIVE:   LMP 02/08/2021 (Exact Date)   Physical Exam   Depression screen [facility] 2/9 03/09/2021  Decreased Interest 0  Down, Depressed, Hopeless 0  PHQ - 2 Score 0  Altered sleeping 0  Tired, decreased energy 0  Change in appetite 0  Feeling bad or failure about yourself  0  Trouble concentrating 0  Moving slowly or fidgety/restless 0  Suicidal thoughts 0  PHQ-9 Score 0  Difficult doing work/chores -  Some recent data might be hidden     {Show previous vital signs (optional):23777}  {Labs   Heme   Chem   Endocrine   Serology   Results Review (optional):23779}  ASSESSMENT/PLAN:   No problem-specific Assessment & Plan notes found for this encounter.    No follow-ups on file.   Littie Deeds, MD [facility]

## 2021-06-18 NOTE — Progress Notes (Signed)
SUBJECTIVE:   CHIEF COMPLAINT / HPI:   Lower Abdominal Pain, Screening for STI Erin Wilkins is a 26 y.o. female who presents to the [facility] clinic to discuss lower abdominal pain ongoing since elective abortion. Patient recently had D&C on 1/10 via at [facility]. Notes that since then she feels "gassy" at the end voiding. Also feels she has been peeing more frequently over the last couple of days. Does endorse watery discharge, unsure if abnormal smell. She stopped having vaginal bleeding 4 days ago. She was previously positive for trichomonas in December and had significant nausea when taking the Metronidazole, would prefer to be treated with Tinidazole if she is positive again. She has only had intercourse once since [facility], has used condoms. Denies fevers, dysuria. For contraception she has tried OCP, Depo and Nexplanon in the past.She is interested in Nexplanon placement.  PERTINENT  PMH / PSH: Trichomonas  OBJECTIVE:   BP 100/75    Pulse 78    Temp 98.3 F (36.8 C)    Wt 105 lb 3.2 oz (47.7 kg)    LMP 02/08/2021 (Exact Date)    SpO2 98%    Breastfeeding Unknown    BMI 19.88 kg/m    General: NAD, pleasant, able to participate in exam Respiratory: Normal effort, breathing comfortably on room air Abdomen: Soft, mild tenderness suprapubically without rebound/guarding Back: No CVAT  GU: Normal appearance of labia majora and minora, without lesions. Vagina tissue pink, moist, without lesions or abrasions, there is blood in vaginal vault. Cervix normal appearance, blood coming out of cervix Skin: warm and dry, no rashes noted Psych: Normal affect and mood  GU exam chaperoned by Annice Pih, CMA   Depression screen [facility] 2/9 06/19/2021 03/09/2021 11/04/2020  Decreased Interest 0 0 0  Down, Depressed, Hopeless 0 0 0  PHQ - 2 Score 0 0 0  Altered sleeping 0 0 0  Tired, decreased energy 0 0 0  Change in appetite 0 0 0  Feeling bad or failure about yourself  0 0 0  Trouble concentrating 0 0 0   Moving slowly or fidgety/restless 0 0 0  Suicidal thoughts 0 0 0  PHQ-9 Score 0 0 0  Difficult doing work/chores - - -  Some recent data might be hidden    ASSESSMENT/PLAN:   Routine screening for STI (sexually transmitted infection) Given history of previous Trichomonas infection and recurrent nausea/vomiting with treatment, will recheck for STI today. Discussed barrier protection to help prevent against acquiring STI.  -HIV, RPR, Hep B, GC/Chlamydia, Trich -Wet prep + BV; will send Rx for metronidazole vaginal gel as patient has had intolerance to PO metronidazole in the past  Encounter for counseling regarding contraception Would like Nexplanon placed. Patient to schedule appointment at front desk. Advised to use condoms for protection in the meantime.  Lower abdominal pain With increased urinary frequency- could be signs of UTI. No fevers, dysuria, nausea, vomiting. No CVAT on examination and only mild suprapubic pain without rebound/guarding. She had an elective surgical abortion approximately 1 month ago (05/16/21). There was blood on examination, patient reports she was told to expect period in 4-6 weeks, believes she may have just started menses. -Urinalysis with microscopy  -Return precautions for signs concerning for PID   Termination of pregnancy (fetus) Appears to be coping well with this. She is no longer with her partner after finding out about trichomonas infection but states she is coping well. PHQ-9 today was negative with a total score of  0.  -Continue to monitor mood; discussed contraception options today, interested in [place]

## 2021-06-19 ENCOUNTER — Encounter: Payer: Self-pay | Admitting: Family Medicine

## 2021-06-19 ENCOUNTER — Other Ambulatory Visit (HOSPITAL_COMMUNITY)
Admission: RE | Admit: 2021-06-19 | Discharge: 2021-06-19 | Disposition: A | Discharge status: Home / Self Care | Payer: Medicaid Other | Source: Ambulatory Visit | Attending: Family Medicine | Admitting: Family Medicine

## 2021-06-19 ENCOUNTER — Ambulatory Visit (INDEPENDENT_AMBULATORY_CARE_PROVIDER_SITE_OTHER): Payer: Medicaid Other | Admitting: Family Medicine

## 2021-06-19 ENCOUNTER — Other Ambulatory Visit: Payer: Self-pay

## 2021-06-19 VITALS — BP 100/75 | HR 78 | Temp 98.3°F | Wt 105.2 lb

## 2021-06-19 DIAGNOSIS — R1030 Lower abdominal pain, unspecified: Secondary | ICD-10-CM | POA: Insufficient documentation

## 2021-06-19 DIAGNOSIS — Z332 Encounter for elective termination of pregnancy: Secondary | ICD-10-CM

## 2021-06-19 DIAGNOSIS — Z113 Encounter for screening for infections with a predominantly sexual mode of transmission: Secondary | ICD-10-CM | POA: Insufficient documentation

## 2021-06-19 DIAGNOSIS — R350 Frequency of micturition: Secondary | ICD-10-CM

## 2021-06-19 DIAGNOSIS — Z3009 Encounter for other general counseling and advice on contraception: Secondary | ICD-10-CM

## 2021-06-19 DIAGNOSIS — Z114 Encounter for screening for human immunodeficiency virus [HIV]: Secondary | ICD-10-CM

## 2021-06-19 HISTORY — DX: Lower abdominal pain, unspecified: R10.30

## 2021-06-19 LAB — POCT WET PREP (WET MOUNT)
Clue Cells Wet Prep Whiff POC: POSITIVE
Trichomonas Wet Prep HPF POC: ABSENT

## 2021-06-19 MED ORDER — METRONIDAZOLE 0.75 % VA GEL: 1.0000 | Freq: Every day | VAGINAL | 0 refills | Status: AC

## 2021-06-19 NOTE — Assessment & Plan Note (Addendum)
With increased urinary frequency- could be signs of UTI. No fevers, dysuria, nausea, vomiting. No CVAT on examination and only mild suprapubic pain without rebound/guarding. She had an elective surgical abortion approximately 1 month ago (05/16/21). There was blood on examination, patient reports she was told to expect period in 4-6 weeks, believes she may have just started menses. -Urinalysis with microscopy  -Return precautions for signs concerning for PID

## 2021-06-19 NOTE — Assessment & Plan Note (Signed)
Would like Nexplanon placed. Patient to schedule appointment at front desk. Advised to use condoms for protection in the meantime.

## 2021-06-19 NOTE — Patient Instructions (Addendum)
It was wonderful to see you today.  Please bring ALL of your medications with you to every visit.   Today we talked about:  Please schedule an appointment at the front desk for Nexplanon placement. Use condoms to help prevent against pregnancy and sexually transmitted infection.  We are doing lab work today to check for sexually transmitted infections and a urinary tract infection. I will send you a MyChart message if you have MyChart. Otherwise, I will give you a call for abnormal results or send a letter if everything returned back normal. If you don't hear from me in 2 weeks, please call the office.    Return if you develop worsening pain, fevers, abnormal discharge, or any other symptoms concerning to you.   Thank you for choosing [facility].   Please call [phone] with any questions about today's appointment.  Please be sure to schedule follow up at the front  desk before you leave today.   Sabino Dick, DO PGY-2 Family Medicine

## 2021-06-19 NOTE — Assessment & Plan Note (Addendum)
Given history of previous Trichomonas infection and recurrent nausea/vomiting with treatment, will recheck for STI today. Discussed barrier protection to help prevent against acquiring STI.  -HIV, RPR, Hep B, GC/Chlamydia, Trich -Wet prep + BV; will send Rx for metronidazole vaginal gel as patient has had intolerance to PO metronidazole in the past

## 2021-06-19 NOTE — Assessment & Plan Note (Signed)
Appears to be coping well with this. She is no longer with her partner after finding out about trichomonas infection but states she is coping well. PHQ-9 today was negative with a total score of 0.  -Continue to monitor mood; discussed contraception options today, interested in Nexplanon

## 2021-06-20 ENCOUNTER — Other Ambulatory Visit: Payer: Self-pay | Admitting: Family Medicine

## 2021-06-20 ENCOUNTER — Encounter: Payer: Self-pay | Admitting: Family Medicine

## 2021-06-20 LAB — MICROSCOPIC EXAMINATION
Casts: NONE SEEN /lpf
Epithelial Cells (non renal): >10 /hpf — AB (ref 0–10)
RBC, Urine: >30 /hpf — AB (ref 0–2)
WBC, UA: >30 /hpf — AB (ref 0–5)

## 2021-06-20 LAB — URINALYSIS, ROUTINE W REFLEX MICROSCOPIC
Bilirubin, UA: NEGATIVE
Glucose, UA: NEGATIVE
Ketones, UA: NEGATIVE
Nitrite, UA: POSITIVE — AB
Specific Gravity, UA: 1.024 (ref 1.005–1.030)
Urobilinogen, Ur: 0.2 mg/dL (ref 0.2–1.0)
pH, UA: 6.5 (ref 5.0–7.5)

## 2021-06-20 LAB — RPR: RPR Ser Ql: NONREACTIVE

## 2021-06-20 LAB — CERVICOVAGINAL ANCILLARY ONLY
Chlamydia: NEGATIVE
Comment: NEGATIVE
Comment: NORMAL
Neisseria Gonorrhea: NEGATIVE

## 2021-06-20 LAB — HIV ANTIBODY (ROUTINE TESTING W REFLEX): HIV Screen 4th Generation wRfx: NONREACTIVE

## 2021-06-20 LAB — HEPATITIS B SURFACE ANTIGEN: Hepatitis B Surface Ag: NEGATIVE

## 2021-06-20 MED ORDER — NITROFURANTOIN MONOHYD MACRO 100 MG PO CAPS: 100.0000 mg | ORAL_CAPSULE | Freq: Two times a day (BID) | ORAL | 0 refills | Status: AC

## 2021-06-20 NOTE — Progress Notes (Signed)
Sending macrobid given allergies to first-gen cephalosporin.

## 2021-06-30 ENCOUNTER — Encounter: Payer: Self-pay | Admitting: Family Medicine

## 2021-06-30 NOTE — Progress Notes (Signed)
Patient has no-showed to multiple appointments in a 6-month period. Per our no-show policy, a letter has been routed to [facility] Admin to be mailed to patient regarding likely dismissal for repeat no show. Will CC to PCP.  ° °

## 2021-07-17 ENCOUNTER — Ambulatory Visit (INDEPENDENT_AMBULATORY_CARE_PROVIDER_SITE_OTHER): Payer: Medicaid Other

## 2021-07-17 ENCOUNTER — Other Ambulatory Visit: Payer: Self-pay

## 2021-07-17 DIAGNOSIS — Z111 Encounter for screening for respiratory tuberculosis: Secondary | ICD-10-CM

## 2021-07-17 NOTE — Progress Notes (Signed)
Patient is here for a PPD placement.  PPD placed in left forearm @ 3:35 pm.  Patient will return 07/19/2021 to have PPD read. Talbot Grumbling, RN ? ?

## 2021-07-19 ENCOUNTER — Other Ambulatory Visit: Payer: Self-pay

## 2021-07-19 ENCOUNTER — Ambulatory Visit (INDEPENDENT_AMBULATORY_CARE_PROVIDER_SITE_OTHER): Payer: Medicaid Other

## 2021-07-19 DIAGNOSIS — Z111 Encounter for screening for respiratory tuberculosis: Secondary | ICD-10-CM

## 2021-07-19 LAB — TB SKIN TEST
Induration: 0 mm
TB Skin Test: NEGATIVE

## 2021-07-19 NOTE — Progress Notes (Signed)
Patient is here for a PPD read.  It was placed on 07/19/2021 in the left forearm @ 3:35 pm.   ? ?PPD RESULTS: ? ?Result: negative ?Induration: 0 mm ? ?Letter created and given to patient for documentation purposes. Veronda Prude, RN ? ?

## 2021-08-01 IMAGING — US US OB < 14 WEEKS - US OB TV
1 series · 15 of 28 positions shown · non-contrast
Comparison: None.

CLINICAL DATA: Vaginal bleeding. 6 weeks 1 day gestational age by
LMP.

EXAM:
OBSTETRIC <14 WK US AND TRANSVAGINAL OB US
TECHNIQUE: Both transabdominal and transvaginal ultrasound examinations were
performed for complete evaluation of the gestation as well as the
maternal uterus, adnexal regions, and pelvic cul-de-sac.
Transvaginal technique was performed to assess early pregnancy.

[Series 1: us ob < 14 weeks - us ob tv · 45 acquisitions, 15 frames shown]
[im 1/45]
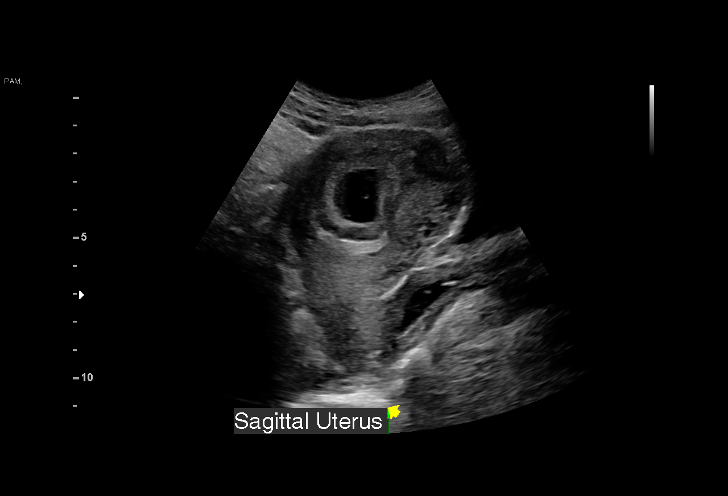
[im 4/45]
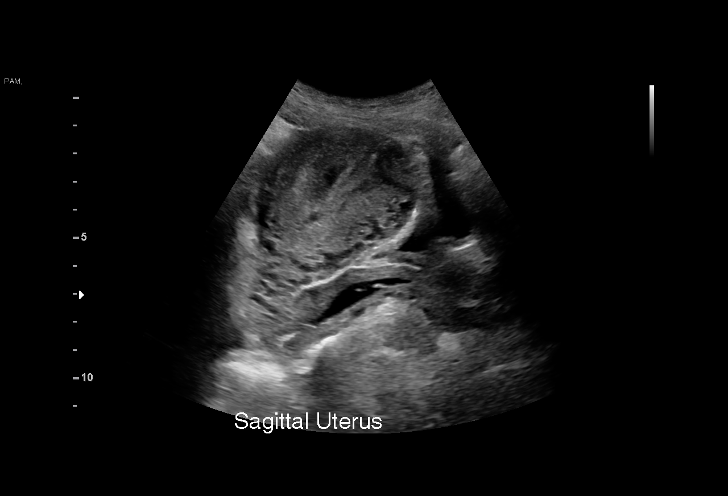
[im 7/45]
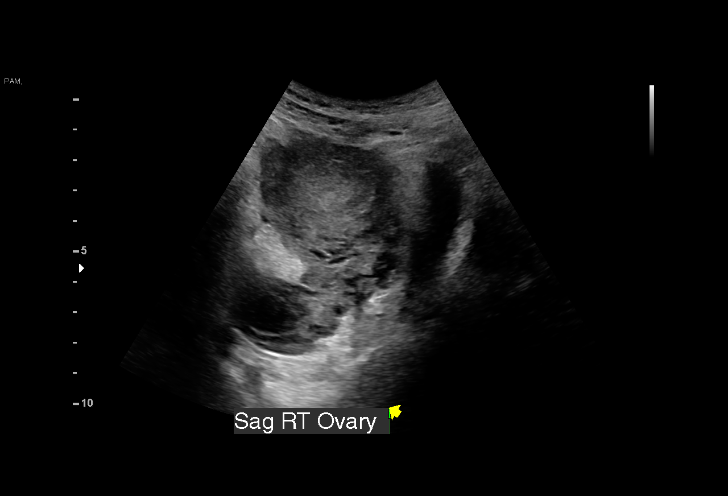
[im 10/45]
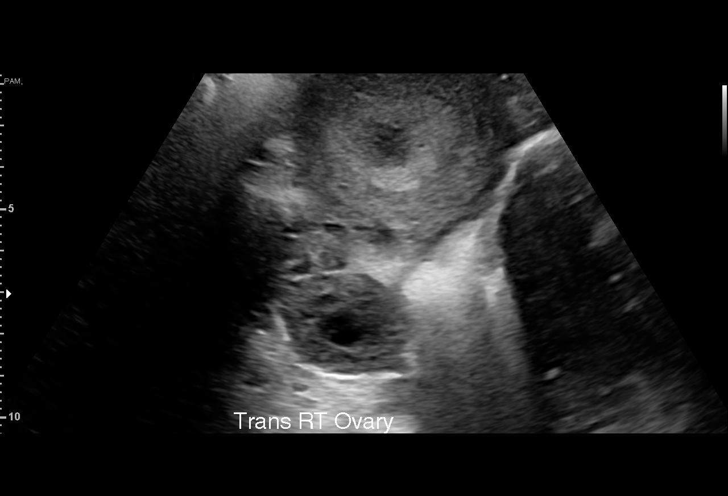
[im 14/45]
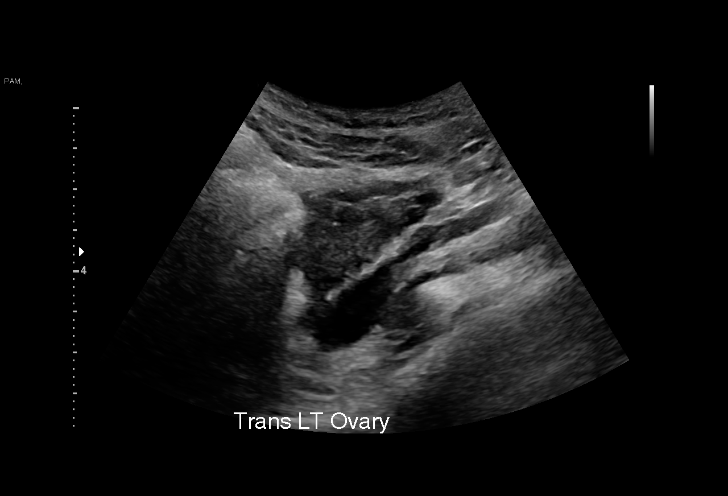
[im 17/45]
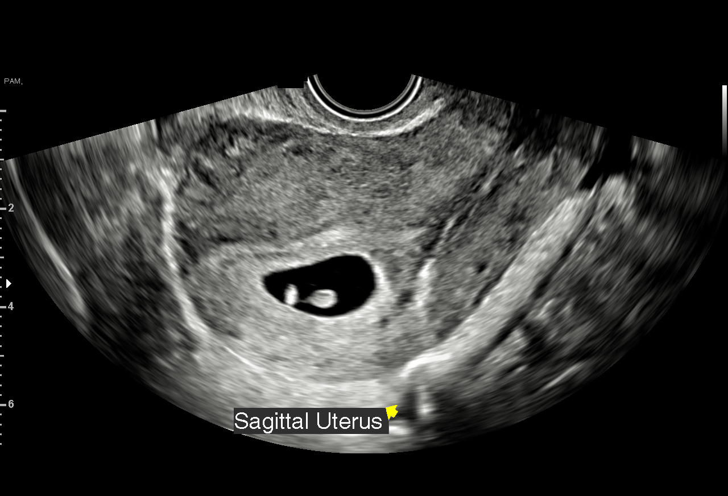
[im 20/45]
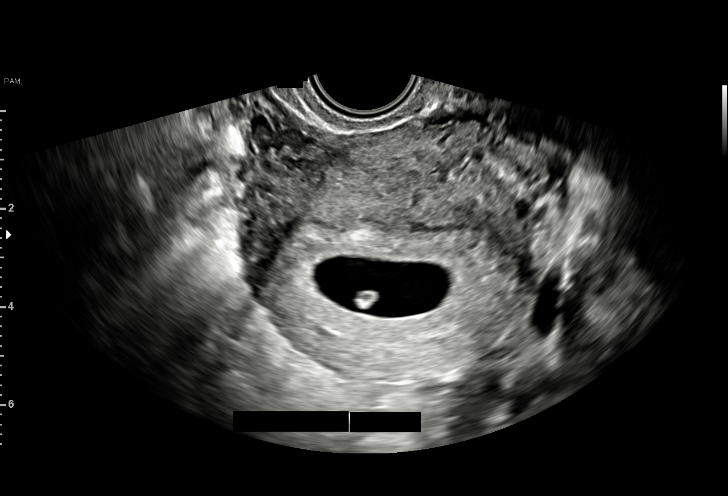
[im 23/45]
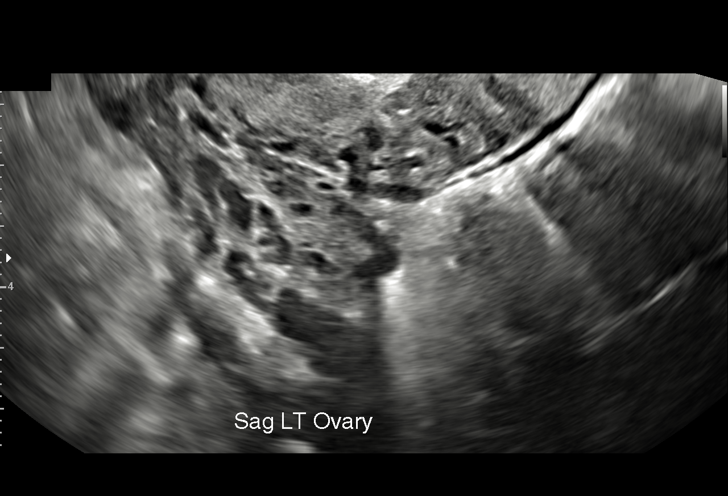
[im 25/45]
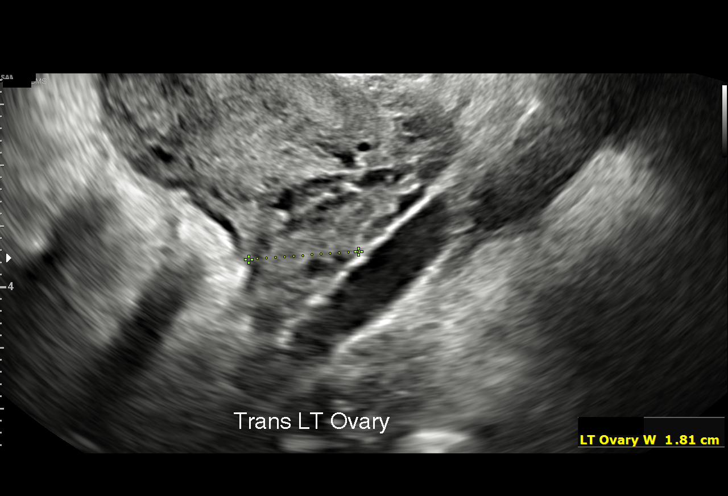
[im 28/45]
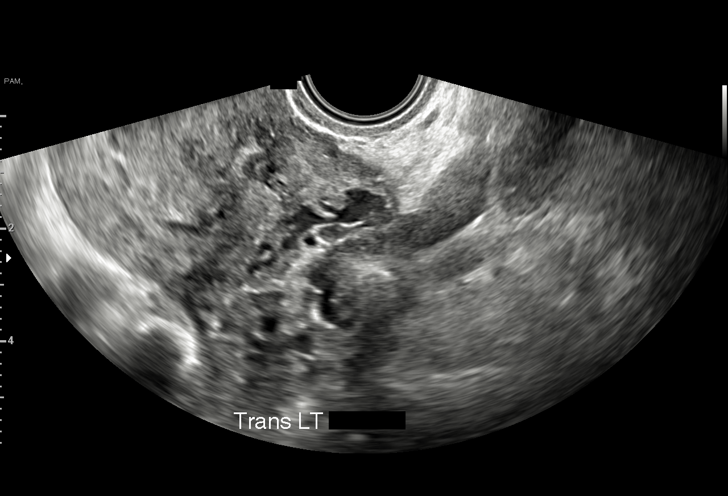
[im 31/45]
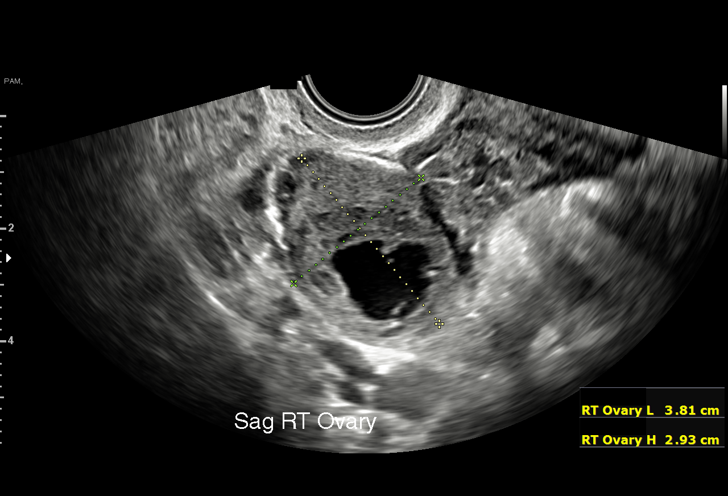
[im 35/45]
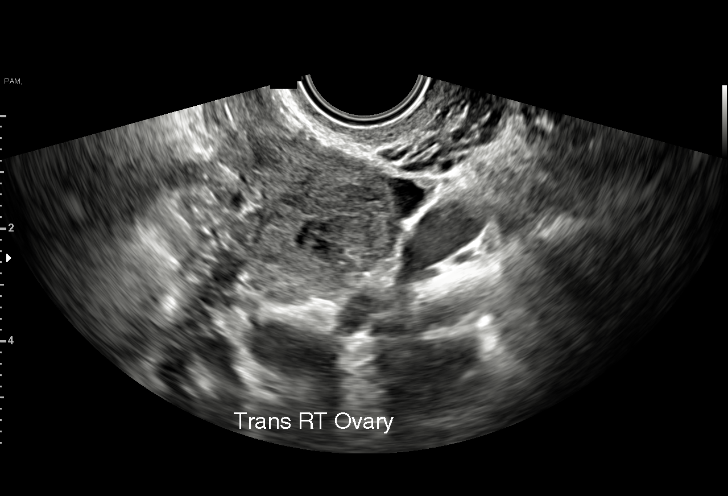
[im 38/45]
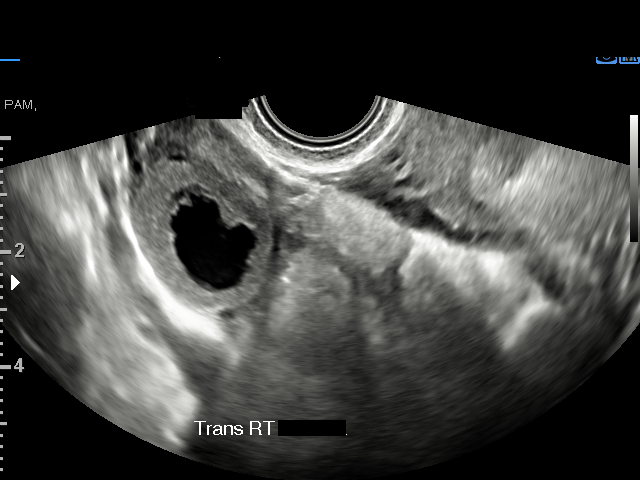
[im 41/45]
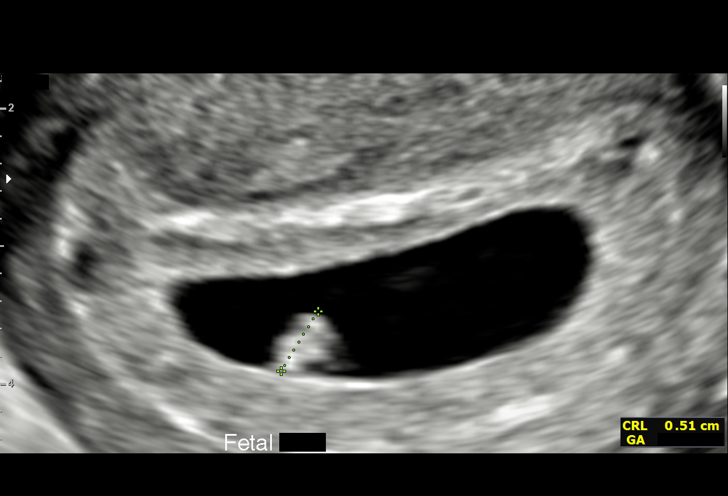
[im 45/45]
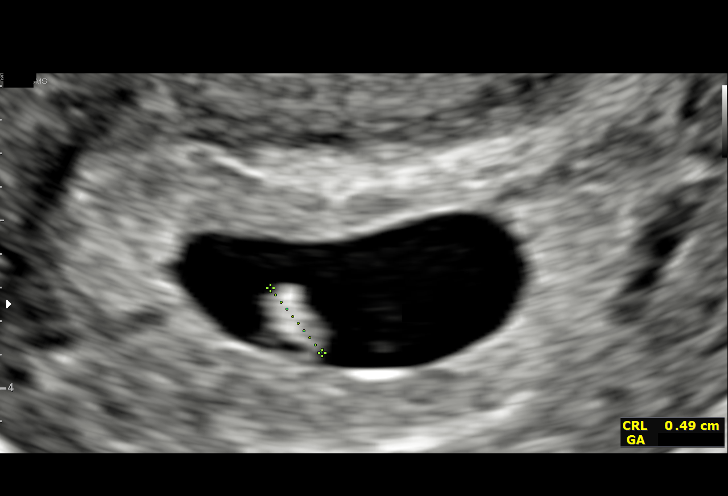

[15 of 28 positions shown; findings below may reference images not displayed]

FINDINGS: Intrauterine gestational sac: Single

Yolk sac:  Visualized.

Embryo:  Visualized.

Cardiac Activity: Visualized.

Heart Rate: 121 bpm

CRL:  5 mm   6 w   1 d                  US EDC: 05/19/2021

Subchorionic hemorrhage:  None visualized.

Maternal uterus/adnexae: Both ovaries are normal appearance. Small
right ovarian corpus luteum noted. No adnexal mass or abnormal free
fluid identified.
IMPRESSION: Single living IUP with estimated gestational age of 6 weeks 1 day,
and US EDC of 05/19/2021.

No maternal uterine or adnexal abnormality identified.

## 2021-08-09 ENCOUNTER — Ambulatory Visit: Payer: Medicaid Other | Admitting: Family Medicine

## 2021-08-09 NOTE — Progress Notes (Deleted)
? ? ? ?  SUBJECTIVE:  ? ?CHIEF COMPLAINT / HPI:  ? ?Erin Wilkins is a 26 y.o. female presents for *** ? ? ?*** ? ?Flowsheet Row Office Visit from 06/19/2021 in [facility]  ?PHQ-9 Total Score 0  ? ?  ?  ? ?There are no preventive care reminders to display for this patient. ?  ? ?PERTINENT  PMH / PSH:  ? ?OBJECTIVE:  ? ?LMP 02/08/2021 (Exact Date)   ? ?General: Alert, no acute distress ?Cardio: Normal S1 and S2, RRR, no r/m/g ?Pulm: CTAB, normal work of breathing ?Abdomen: Bowel sounds normal. Abdomen soft and non-tender.  ?Extremities: No peripheral edema.  ?Neuro: Cranial nerves grossly intact  ? ?ASSESSMENT/PLAN:  ? ?No problem-specific Assessment & Plan notes found for this encounter. ?  ? ?Towanda Octave, MD PGY-3 ?[facility]  ?

## 2021-08-21 ENCOUNTER — Encounter: Payer: Self-pay | Admitting: Family Medicine

## 2021-08-21 ENCOUNTER — Ambulatory Visit (INDEPENDENT_AMBULATORY_CARE_PROVIDER_SITE_OTHER): Payer: Medicaid Other | Admitting: Family Medicine

## 2021-08-21 ENCOUNTER — Other Ambulatory Visit (HOSPITAL_COMMUNITY)
Admission: RE | Admit: 2021-08-21 | Discharge: 2021-08-21 | Disposition: A | Discharge status: Home / Self Care | Payer: Medicaid Other | Source: Ambulatory Visit | Attending: Family Medicine | Admitting: Family Medicine

## 2021-08-21 VITALS — BP 109/78 | HR 72 | Ht 61.0 in | Wt 106.4 lb

## 2021-08-21 DIAGNOSIS — Z113 Encounter for screening for infections with a predominantly sexual mode of transmission: Secondary | ICD-10-CM

## 2021-08-21 DIAGNOSIS — S3993XA Unspecified injury of pelvis, initial encounter: Secondary | ICD-10-CM | POA: Diagnosis not present

## 2021-08-21 DIAGNOSIS — Z3009 Encounter for other general counseling and advice on contraception: Secondary | ICD-10-CM | POA: Diagnosis not present

## 2021-08-21 NOTE — Patient Instructions (Signed)
Thank you for coming to see me today. It was a pleasure.  ? ?We will get some labs today.  If they are abnormal or we need to do something about them, I will call you.  If they are normal, I will send you a message on MyChart (if it is active) or a letter in the mail.  If you don't hear from [place] in 2 weeks, please call the office at the number below.  ? ?If you have any questions or concerns, please do not hesitate to call the office at [phone]. ? ?Best wishes,  ? ?Dr Posey Pronto   ?

## 2021-08-21 NOTE — Progress Notes (Signed)
? ? ? ?  SUBJECTIVE:  ? ?CHIEF COMPLAINT / HPI:  ? ?Erin Wilkins is a 26 y.o. female presents for vaginal pain ? ?Vaginal concerns  ?Pt feels the area near her vagina has been "cut" or "ripped" for the last 1 week ago. Has not taken for the pain.  Pt had a surgical abortion in Jan 23 (third abortion). Denies abnormal bleeding since then, fevers etc.  Used a sex toys sometimes. Does have "rough" intercourse and feels like this could  be contributing to her symptoms. She also has anal intercourse. LMP: 10th March, cycles are regular.  Does feel like she has BV every time her cycle finishes. Pt has been sexually active since the abortion. Same partner since abortion.Does not use condoms or birth control. Of note pt has recent trichomonas infection and was treated. Partner was also treated. Feels safe in her relationship. ? ?Flowsheet Row Office Visit from 08/21/2021 in [facility]  ?PHQ-9 Total Score 0  ? ?  ? ?PERTINENT  PMH / PSH: trichomonas ? ?OBJECTIVE:  ? ?BP 109/78   Pulse 72   Ht 5\' 1"  (1.549 m)   Wt 106 lb 6.4 oz (48.3 kg)   LMP 08/14/2021   SpO2 100%   BMI 20.10 kg/m?   ? ?General: Alert, no acute distress ?Cardio:  well perfused  ?Pulm: normal work of breathing ?Neuro: Cranial nerves grossly intact  ? ? ?Pelvic Exam chaperoned by [facility] April  ?       External: normal female genitalia without lesions or masses ?       Vagina: normal without lesions or masses ?       Cervix: normal without lesions or masses ?       Samples for Wet prep, GC/Chlamydia obtained  ? ?Clear, watery vaginal discharge noted.  ? ?Normal external anal exam  ? ?ASSESSMENT/PLAN:  ? ?Vaginal trauma ?Symptoms likely 2/2 "rough" intercourse and very frequent use of sex toys. No obvious lacerations on exam today on genital exam or anal exam. No vulval lesions noted, no erythema or edema. Low suspicion for infection/abscess. Recommended use of frequent lubrications and more gentle use of sex toys. Safe sex precaution given  to pt.  ? ?Encounter for counseling regarding contraception ?Discussed options for birth control. Strongly recommended birth control given hx of 3 x pregnancy abortions. Pt would like nexaplanon placed. Pt can schedule this appointment at front desk.  Recommended condom use to prevent STDs. ? ?Routine screening for STI (sexually transmitted infection) ?No change in sexual partners since last STD testing however given pt is high risk rechecked for STDs today.  ?  ? ?May, MD PGY-3 ?[facility]  ? ? ? ?

## 2021-08-22 DIAGNOSIS — S3993XA Unspecified injury of pelvis, initial encounter: Secondary | ICD-10-CM | POA: Insufficient documentation

## 2021-08-22 NOTE — Assessment & Plan Note (Signed)
No change in sexual partners since last STD testing however given pt is high risk rechecked for STDs today.  ?

## 2021-08-22 NOTE — Assessment & Plan Note (Signed)
Discussed options for birth control. Strongly recommended birth control given hx of 3 x pregnancy abortions. Pt would like nexaplanon placed. Pt can schedule this appointment at front desk.  Recommended condom use to prevent STDs. ?

## 2021-08-22 NOTE — Assessment & Plan Note (Addendum)
Symptoms likely 2/2 "rough" intercourse and very frequent use of sex toys. No obvious lacerations on exam today on genital exam or anal exam. No vulval lesions noted, no erythema or edema. Low suspicion for infection/abscess. Recommended use of frequent lubrications and more gentle use of sex toys. Safe sex precaution given to pt.  ?

## 2021-08-23 ENCOUNTER — Telehealth: Payer: Self-pay

## 2021-08-23 ENCOUNTER — Other Ambulatory Visit: Payer: Self-pay | Admitting: Family Medicine

## 2021-08-23 LAB — CERVICOVAGINAL ANCILLARY ONLY
Bacterial Vaginitis (gardnerella): POSITIVE — AB
Candida Glabrata: NEGATIVE
Candida Vaginitis: POSITIVE — AB
Chlamydia: NEGATIVE
Comment: NEGATIVE
Comment: NEGATIVE
Comment: NEGATIVE
Comment: NEGATIVE
Comment: NEGATIVE
Comment: NORMAL
Neisseria Gonorrhea: NEGATIVE
Trichomonas: NEGATIVE

## 2021-08-23 MED ORDER — METRONIDAZOLE 500 MG PO TABS: 500.0000 mg | ORAL_TABLET | Freq: Two times a day (BID) | ORAL | 0 refills | Status: AC

## 2021-08-23 MED ORDER — FLUCONAZOLE 150 MG PO TABS: 150.0000 mg | ORAL_TABLET | Freq: Once | ORAL | 0 refills | Status: AC

## 2021-08-23 NOTE — Telephone Encounter (Signed)
I have sent her a Clinical cytogeneticist message. She has BV and yeast. I have sent the meds in. Thank you.  ?

## 2021-08-23 NOTE — Telephone Encounter (Signed)
Patient calls nurse line requesting results and plan from recent STD tests.  ? ?Will forward to PCP.  ?

## 2021-09-15 ENCOUNTER — Ambulatory Visit: Payer: Medicaid Other | Admitting: Family Medicine

## 2021-10-10 ENCOUNTER — Encounter: Payer: Self-pay | Admitting: *Deleted

## 2021-10-13 ENCOUNTER — Ambulatory Visit: Payer: Medicaid Other | Admitting: Family Medicine

## 2021-10-31 ENCOUNTER — Encounter: Payer: Self-pay | Admitting: Family Medicine

## 2021-10-31 ENCOUNTER — Other Ambulatory Visit (HOSPITAL_COMMUNITY)
Admission: RE | Admit: 2021-10-31 | Discharge: 2021-10-31 | Disposition: A | Discharge status: Home / Self Care | Payer: Medicaid Other | Source: Ambulatory Visit | Attending: Family Medicine | Admitting: Family Medicine

## 2021-10-31 ENCOUNTER — Ambulatory Visit (INDEPENDENT_AMBULATORY_CARE_PROVIDER_SITE_OTHER): Payer: Medicaid Other | Admitting: Family Medicine

## 2021-10-31 VITALS — BP 98/58 | HR 63 | Wt 101.0 lb

## 2021-10-31 DIAGNOSIS — Z3200 Encounter for pregnancy test, result unknown: Secondary | ICD-10-CM | POA: Diagnosis not present

## 2021-10-31 DIAGNOSIS — Z114 Encounter for screening for human immunodeficiency virus [HIV]: Secondary | ICD-10-CM

## 2021-10-31 DIAGNOSIS — N898 Other specified noninflammatory disorders of vagina: Secondary | ICD-10-CM | POA: Diagnosis not present

## 2021-10-31 DIAGNOSIS — Z3009 Encounter for other general counseling and advice on contraception: Secondary | ICD-10-CM

## 2021-10-31 DIAGNOSIS — B379 Candidiasis, unspecified: Secondary | ICD-10-CM

## 2021-10-31 DIAGNOSIS — Z113 Encounter for screening for infections with a predominantly sexual mode of transmission: Secondary | ICD-10-CM

## 2021-10-31 LAB — POCT WET PREP (WET MOUNT)
Clue Cells Wet Prep Whiff POC: NEGATIVE
Trichomonas Wet Prep HPF POC: ABSENT
WBC, Wet Prep HPF POC: >20

## 2021-10-31 MED ORDER — FLUCONAZOLE 150 MG PO TABS: 150.0000 mg | ORAL_TABLET | Freq: Once | ORAL | 0 refills | Status: AC

## 2021-11-01 LAB — HIV ANTIBODY (ROUTINE TESTING W REFLEX): HIV Screen 4th Generation wRfx: NONREACTIVE

## 2021-11-01 LAB — RPR: RPR Ser Ql: NONREACTIVE

## 2021-11-01 LAB — CERVICOVAGINAL ANCILLARY ONLY
Chlamydia: NEGATIVE
Comment: NEGATIVE
Comment: NORMAL
Neisseria Gonorrhea: NEGATIVE

## 2021-11-01 LAB — PREGNANCY, URINE: Preg Test, Ur: NEGATIVE

## 2021-11-02 ENCOUNTER — Telehealth: Payer: Self-pay

## 2021-11-02 NOTE — Telephone Encounter (Signed)
Patient calls nurse line regarding results from visit on 6/27. Patient states that she was able to view negative results on mychart, however, "something still doesn't feel right."  Patient reports that she continues to have watery discharge with a strong odor.   Will forward to Dr. Leary Roca (evaluated patient for concern on 6/27)  Patient can be reached at [phone] or via mychart.   Veronda Prude, RN

## 2021-11-06 ENCOUNTER — Ambulatory Visit: Payer: Medicaid Other | Admitting: Family Medicine

## 2021-11-06 NOTE — Telephone Encounter (Signed)
Pt did take diflucan and is still having vaginal irritation.  Agreeable to appt this afternoon @ 2:10 Jone Baseman, CMA

## 2021-11-07 NOTE — Progress Notes (Deleted)
    SUBJECTIVE:   CHIEF COMPLAINT / HPI: vaginal irritation   Vaginal Irritation  Patient presents for follow up for vaginal irritation despite being treated for vaginal yeast infection (Diflucan)  She had normal wet prep at that time  She describes the irritation as ***   She had negative tests for gonorrhea and chlamydia on 6.27.23  Hx is notable for vaginal candidiasis and BV    OBJECTIVE:   LMP 10/12/2021   Genitalia:  Normal introitus for age, no external lesions, no vaginal discharge, mucosa pink and moist, no vaginal or cervical lesions, no vaginal atrophy, no friaility or hemorrhage, normal uterus size and position, no adnexal masses or tenderness   ASSESSMENT/PLAN:   No problem-specific Assessment & Plan notes found for this encounter.  Will collect repeat wet prep      Ronnald Ramp, MD [facility]

## 2021-11-08 ENCOUNTER — Ambulatory Visit: Payer: Medicaid Other | Admitting: Family Medicine

## 2021-11-08 NOTE — Progress Notes (Signed)
    SUBJECTIVE:   CHIEF COMPLAINT / HPI: vaginal discharge and odor  Vaginal Irritation  Patient presents for follow up for vaginal irritation despite being treated for vaginal yeast infection (Diflucan)  She had normal wet prep at that time  Patient reports that she initially and white vaginal discharge that resolved after being treated  She had negative tests for gonorrhea and chlamydia on 6.27.23  Hx is notable for vaginal candidiasis and BV   Today she states that she continues to have vaginal discharge and odor that is similar to Denies any vaginal pruritus She denies irritation She states that she had a prolonged.  Of time without bacterial vaginosis recurrence, she is take the metronidazole pills however she states that she has a difficult time swallowing and feels She states that she has not been sexually active since her last visit She reports that she is on her menses now She denies using vaginal cleansers or drainage products but states that she may have changed laundry detergent recently She denies any irritation to her labia  OBJECTIVE:   BP 94/60   Ht 5\' 1"  (1.549 m)   Wt 103 lb 9.6 oz (47 kg)   LMP 11/09/2021   BMI 19.58 kg/m   Genitalia:  Normal introitus for age, no external lesions, watery vaginal discharge, menstrual blood pooled in vaginal vault, mucosa pink and moist, no vaginal or cervical lesions, no vaginal atrophy, no friaility or hemorrhage   ASSESSMENT/PLAN:   Vaginal odor Repeat aptima swab collected to test for bacterial vaginosis, candidiasis Given patient's symptoms and description, will empirically treat for bacterial vaginosis We will follow-up with results once available    01/10/2022, MD [facility]

## 2021-11-09 ENCOUNTER — Encounter: Payer: Self-pay | Admitting: Family Medicine

## 2021-11-09 ENCOUNTER — Ambulatory Visit (INDEPENDENT_AMBULATORY_CARE_PROVIDER_SITE_OTHER): Payer: Medicaid Other | Admitting: Family Medicine

## 2021-11-09 ENCOUNTER — Other Ambulatory Visit (HOSPITAL_COMMUNITY)
Admission: RE | Admit: 2021-11-09 | Discharge: 2021-11-09 | Disposition: A | Discharge status: Home / Self Care | Payer: Medicaid Other | Source: Ambulatory Visit | Attending: Family Medicine | Admitting: Family Medicine

## 2021-11-09 VITALS — BP 94/60 | Ht 61.0 in | Wt 103.6 lb

## 2021-11-09 DIAGNOSIS — N898 Other specified noninflammatory disorders of vagina: Secondary | ICD-10-CM

## 2021-11-09 MED ORDER — FLUCONAZOLE 150 MG PO TABS: ORAL_TABLET | ORAL | 0 refills | Status: DC

## 2021-11-09 MED ORDER — METRONIDAZOLE 0.75 % EX GEL: 1.0000 | Freq: Every day | CUTANEOUS | 0 refills | Status: AC

## 2021-11-09 NOTE — Patient Instructions (Signed)
I have prescribed metronidazole gel to apply daily for 5 days.   I have also prescribed Diflucan for you to use after completion of the gel treatment.   Please take one diflucan tablet on first date of yeast infection and the second tablet after three days.    I will notify you of any abnormal results from the swabs collected today.

## 2021-11-09 NOTE — Assessment & Plan Note (Addendum)
Repeat aptima swab collected to test for bacterial vaginosis, candidiasis Given patient's symptoms and description, will empirically treat for bacterial vaginosis We will follow-up with results once available

## 2021-11-10 LAB — CERVICOVAGINAL ANCILLARY ONLY
Bacterial Vaginitis (gardnerella): POSITIVE — AB
Candida Glabrata: NEGATIVE
Candida Vaginitis: NEGATIVE
Comment: NEGATIVE
Comment: NEGATIVE
Comment: NEGATIVE
Comment: NEGATIVE
Trichomonas: NEGATIVE

## 2021-11-14 ENCOUNTER — Ambulatory Visit: Payer: Medicaid Other | Admitting: Family Medicine

## 2021-11-14 NOTE — Progress Notes (Deleted)
     SUBJECTIVE:   CHIEF COMPLAINT / HPI:   Erin Wilkins is a 26 y.o. female presents for nexplanon insertion    ***  Flowsheet Row Office Visit from 10/31/2021 in [facility]  PHQ-9 Total Score 0        There are no preventive care reminders to display for this patient.    PERTINENT  PMH / PSH:   OBJECTIVE:   LMP 11/09/2021    General: Alert, no acute distress Cardio: Normal S1 and S2, RRR, no r/m/g Pulm: CTAB, normal work of breathing Abdomen: Bowel sounds normal. Abdomen soft and non-tender.  Extremities: No peripheral edema.  Neuro: Cranial nerves grossly intact   ASSESSMENT/PLAN:   No problem-specific Assessment & Plan notes found for this encounter.    Towanda Octave, MD PGY-3 [facility]

## 2021-11-16 ENCOUNTER — Ambulatory Visit: Payer: Medicaid Other

## 2022-01-07 ENCOUNTER — Encounter (HOSPITAL_COMMUNITY): Payer: Self-pay

## 2022-01-07 ENCOUNTER — Inpatient Hospital Stay (HOSPITAL_COMMUNITY)
Admission: AD | Admit: 2022-01-07 | Discharge: 2022-01-07 | Disposition: A | Discharge status: Home / Self Care | Payer: Medicaid Other | Attending: Obstetrics & Gynecology | Admitting: Obstetrics & Gynecology

## 2022-01-07 DIAGNOSIS — Z3202 Encounter for pregnancy test, result negative: Secondary | ICD-10-CM | POA: Diagnosis not present

## 2022-01-07 LAB — POCT PREGNANCY, URINE: Preg Test, Ur: NEGATIVE

## 2022-01-07 NOTE — MAU Note (Signed)
.  Erin Wilkins is a 26 y.o. at Unknown here in MAU reporting: left ABD pain that intermittent, mostly with activity or positional for the last week. Pt denies any other c/o. Pt denies VB. Pt poct preg test was neg.   Onset of complaint: last week Pain score: 0/10 Vitals:   01/07/22 2018  BP: 128/85  Pulse: (!) 104  Resp: 18  Temp: 98.3 F (36.8 C)  SpO2: 100%      Lab orders placed from triage:  POCT preg

## 2022-01-07 NOTE — MAU Provider Note (Signed)
Event Date/Time   First Provider Initiated Contact with Patient 01/07/22 2005      S [name] is a 26 y.o. [phone] patient who presents to MAU today with complaint of possible pregnancy and LLQ abdominal pain. Patient states she tracks her period and her period is 2 days late. Patient states her period is never late and she is concerned that she might be pregnant. Patient also reports a long standing (several months long) LLQ abdominal pain that is intermittent and not present at this time, but was concerned it might be related to a possible pregnancy. Patient does not wish to be pregnant at this time and reports using condoms sometimes.  O BP 128/85 (BP Location: Right Arm)   Pulse (!) 104   Temp 98.3 F (36.8 C) (Oral)   Resp 18   Ht 5' (1.524 m)   Wt 45.3 kg   SpO2 100%   BMI 19.49 kg/m   Patient Vitals for the past 24 hrs:  BP Temp Temp src Pulse Resp SpO2 Height Weight  01/07/22 2018 128/85 98.3 F (36.8 C) Oral (!) 104 18 100 % 5' (1.524 m) 45.3 kg   Physical Exam Vitals and nursing note reviewed.  Constitutional:      General: She is not in acute distress.    Appearance: Normal appearance. She is not ill-appearing, toxic-appearing or diaphoretic.  HENT:     Head: Normocephalic and atraumatic.  Pulmonary:     Effort: Pulmonary effort is normal.  Neurological:     Mental Status: She is alert and oriented to person, place, and time.  Psychiatric:        Mood and Affect: Mood normal.        Behavior: Behavior normal.        Thought Content: Thought content normal.        Judgment: Judgment normal.     A Medical screening exam complete UPT negative  P Discharge from MAU in stable condition Patient given the option of transfer to [facility] for further evaluation or seek care in outpatient facility of choice List of options for follow-up given Warning signs for worsening condition that would warrant emergency follow-up discussed Patient may return to MAU as needed    Chales Pelissier, Odie Sera, NP 01/07/2022 8:23 PM

## 2022-01-07 NOTE — Discharge Instructions (Signed)
Prenatal Care Providers           [facility] - accepts patients without insurance  Phone: [phone]  [facility] @ [facility]   Phone: [phone]  [facility]       Phone: [phone]            [facility] @ [place]     Phone: [phone]          [facility] @ [place]   Phone: [phone]  [facility] @ [facility] - accepts patients without insurance  Phone: [phone]  [facility] @ [facility] Phone: [phone]     [facility] - accepts patients without insurance Phone: [phone]  [facility]  Phone: [phone]  [facility] Phone: [phone]  [facility] Phone: [phone]  [facility] Phone: [phone]  [facility] Phone: [phone]  [facility]  Phone: [phone]         

## 2022-01-19 ENCOUNTER — Ambulatory Visit (INDEPENDENT_AMBULATORY_CARE_PROVIDER_SITE_OTHER): Payer: Medicaid Other | Admitting: Student

## 2022-01-19 ENCOUNTER — Other Ambulatory Visit (HOSPITAL_COMMUNITY)
Admission: RE | Admit: 2022-01-19 | Discharge: 2022-01-19 | Disposition: A | Discharge status: Home / Self Care | Payer: Medicaid Other | Source: Ambulatory Visit | Attending: Family Medicine | Admitting: Family Medicine

## 2022-01-19 VITALS — BP 118/75 | HR 74 | Ht 60.0 in | Wt 98.8 lb

## 2022-01-19 DIAGNOSIS — Z113 Encounter for screening for infections with a predominantly sexual mode of transmission: Secondary | ICD-10-CM

## 2022-01-19 DIAGNOSIS — B9689 Other specified bacterial agents as the cause of diseases classified elsewhere: Secondary | ICD-10-CM

## 2022-01-19 DIAGNOSIS — N760 Acute vaginitis: Secondary | ICD-10-CM | POA: Diagnosis not present

## 2022-01-19 DIAGNOSIS — N898 Other specified noninflammatory disorders of vagina: Secondary | ICD-10-CM

## 2022-01-19 LAB — POCT WET PREP (WET MOUNT)
Clue Cells Wet Prep Whiff POC: POSITIVE
Trichomonas Wet Prep HPF POC: ABSENT
WBC, Wet Prep HPF POC: >20

## 2022-01-19 MED ORDER — METRONIDAZOLE 0.75 % VA GEL: 1.0000 | Freq: Two times a day (BID) | VAGINAL | 0 refills | Status: AC

## 2022-01-19 MED ORDER — METRONIDAZOLE 0.75 % VA GEL: 1.0000 | VAGINAL | 3 refills | Status: DC

## 2022-01-19 NOTE — Patient Instructions (Signed)
It was great to see you! Thank you for allowing me to participate in your care!   Our plans for today:  -I have sent in a prescription for vaginal metronidazole gel, apply 1 applicator twice daily for 7 days.  Next, to prevent future infections, use 1 applicator twice a week for 4 months   We are checking some labs today, I will call you if they are abnormal will send you a MyChart message or a letter if they are normal.  If you do not hear about your labs in the next 2 weeks please let us know.  Take care and seek immediate care sooner if you develop any concerns.   Dr. Erick Alley, DO [facility]

## 2022-01-19 NOTE — Assessment & Plan Note (Addendum)
While we were getting swabs to test for BV, we also tested for gonorrhea and chlamydia.  Since BV test came back positive, patient does not feel the need to get tested for HIV or syphilis at this time since she tested negative for both in June and does not have a new partner.

## 2022-01-19 NOTE — Progress Notes (Signed)
    SUBJECTIVE:   CHIEF COMPLAINT / HPI:   Vaginal odor and irritation  Pt states symptoms started less than a week ago after her period. Symptoms include fishy odor. The fishy odor has resolved but she still feel like something is "off" but difficult to explain. Discharge is white/clear in color.  She denies any itchiness, white thick discharge or burning in vaginal area. No suprapubic pain, no nausea.   Patient was last seen here in July and given metronidazole gel for BV.  History significant for recurrent BV, four symptomatic positives in past year, each time after her period.  She has 1 sexual partner and is not concerned for STDs but would like gonorrhea and chlamydia swabs done today while we are testing for BV to be safe.  She was negative for HIV and syphilis in June 2023.   PERTINENT  PMH / PSH: Recurrent BV  OBJECTIVE:   Vitals:   01/19/22 1454  BP: 118/75  Pulse: 74  SpO2: 98%     General: NAD, pleasant, able to participate in exam Cardiac: Well perfused Respiratory: Breathing comfortably on room air Extremities: no edema or cyanosis. GU: Normal external female genitalia, no lesions.  Moist pink vaginal mucosa with normal-appearing cervix with no cervical motion tenderness and white/clear discharge Neuro: alert, no obvious focal deficits Psych: Normal affect and mood  ASSESSMENT/PLAN:   Bacterial vaginosis As patient has had recurrent BV (now 5 times within the last year), will first treat with vaginal metronidazole gel twice daily for 7 days followed by maintenance therapy of twice a week for 4 months.  Screen for STD (sexually transmitted disease) While we were getting swabs to test for BV, we also tested for gonorrhea and chlamydia.  Since BV test came back positive, patient does not feel the need to get tested for HIV or syphilis at this time since she tested negative for both in June and does not have a new partner.     Dr. Erick Alley, DO [facility]

## 2022-01-19 NOTE — Assessment & Plan Note (Signed)
As patient has had recurrent BV (now 5 times within the last year), will first treat with vaginal metronidazole gel twice daily for 7 days followed by maintenance therapy of twice a week for 4 months.

## 2022-01-22 LAB — CERVICOVAGINAL ANCILLARY ONLY
Chlamydia: NEGATIVE
Comment: NEGATIVE
Comment: NORMAL
Neisseria Gonorrhea: NEGATIVE

## 2022-04-03 ENCOUNTER — Ambulatory Visit: Payer: Medicaid Other | Admitting: Family Medicine

## 2022-04-03 NOTE — Progress Notes (Unsigned)
  SUBJECTIVE:   CHIEF COMPLAINT / HPI:   Desire for STD  testing, feels that she has yeast infection -New partner, female. Dating for months but recently had intercourse without protection -No known hx STDs in partner -Patient has a history of STDs and recurrent BV. Currently using metrogel, states she feels this is causing yeast infection -Has been noticing clumpy white vaginal discharge for a few days since using metrogel -LMP Nov 2. [id number] -Denies dysuria, hematuria  Abd pain -Feels it may be related to constipation -L sided abd pain, comes and goes, usually a dull pain -Pain resolves with defecation, usually. However, she typically goes several days without pooping -Hasn't tried any stool softeners -Willing to try to increase fiber intake and/or try OTC stool softeners  PERTINENT  PMH / PSH: Recurrent BV and yeast infections, hx chlamydia    OBJECTIVE:  BP (!) 121/93   Pulse 79   Ht 5' (1.524 m)   Wt 101 lb (45.8 kg)   LMP 03/08/2022   SpO2 100%   BMI 19.73 kg/m   General: NAD, pleasant, able to participate in exam Cardiac: RRR, no murmurs auscultated Respiratory: CTAB, normal WOB Abdomen: soft non-distended. Mild discomfort to palpation along LLQ. Pelvic: Significant white discharge noted in vaginal canal and surrounding cervix. No significant bleeding or other abnormalities noted in canal or cervix. Bimanual exam completed without any significant cervical motion tenderness.  ASSESSMENT/PLAN:   Vaginal discharge Assessment & Plan: White clumpy discharge noted around labia and in vaginal canal. Given recent use of metrogel, feel that vaginal candidiasis is highly likely. Will obtain wet prep and GC/chlamydia, HIV, RPR as well. POCT pregnancy negative. Very low suspicion for PID given lack of systemic symptoms, benign abdominal exam, and lack of cervical motion tenderness on bimanual exam.  Orders: -     Fluconazole; Take 1 tablet (150 mg total) by mouth once for 1  dose. Take one dose now, and if symptoms do not resolve after 72 hours, take another dose.  Dispense: 2 tablet; Refill: 3 -     POCT urine pregnancy  Screen for STD (sexually transmitted disease) -     Cervicovaginal ancillary only -     POCT Wet Prep ([place]et [place]) -     RPR -     HIV Antibody (routine testing w rflx) -     POCT urine pregnancy  Lower abdominal pain Assessment & Plan: Abd exam reassuring, given hx of constipation and resolution of pain with defecation, feel that her pain is most likely related to constipation. Pt agreeable to increase fiber intake and potentially try OTC laxatives before using prescription strength laxatives. Given concurrent GU symptoms as above, Ddx also includes PID vs ectopic pregnancy vs appendicitis, but feel these are much less likely given her benign exam findings, lack of systemic symptoms, and negative pregnancy test. F/u as needed    Meds ordered this encounter  Medications   fluconazole (DIFLUCAN) 150 MG tablet    Sig: Take 1 tablet (150 mg total) by mouth once for 1 dose. Take one dose now, and if symptoms do not resolve after 72 hours, take another dose.    Dispense:  2 tablet    Refill:  3   Return if symptoms worsen or fail to improve.  Vonna Drafts, MD [facility]

## 2022-04-04 ENCOUNTER — Encounter: Payer: Self-pay | Admitting: Family Medicine

## 2022-04-04 ENCOUNTER — Other Ambulatory Visit (HOSPITAL_COMMUNITY)
Admission: RE | Admit: 2022-04-04 | Discharge: 2022-04-04 | Disposition: A | Discharge status: Home / Self Care | Payer: Medicaid Other | Source: Ambulatory Visit | Attending: Family Medicine | Admitting: Family Medicine

## 2022-04-04 ENCOUNTER — Ambulatory Visit (INDEPENDENT_AMBULATORY_CARE_PROVIDER_SITE_OTHER): Payer: Medicaid Other | Admitting: Family Medicine

## 2022-04-04 VITALS — BP 121/93 | HR 79 | Ht 60.0 in | Wt 101.0 lb

## 2022-04-04 DIAGNOSIS — N898 Other specified noninflammatory disorders of vagina: Secondary | ICD-10-CM | POA: Diagnosis not present

## 2022-04-04 DIAGNOSIS — R1030 Lower abdominal pain, unspecified: Secondary | ICD-10-CM | POA: Diagnosis not present

## 2022-04-04 DIAGNOSIS — Z113 Encounter for screening for infections with a predominantly sexual mode of transmission: Secondary | ICD-10-CM | POA: Insufficient documentation

## 2022-04-04 LAB — POCT WET PREP (WET MOUNT)
Clue Cells Wet Prep Whiff POC: NEGATIVE
Trichomonas Wet Prep HPF POC: ABSENT
WBC, Wet Prep HPF POC: >20

## 2022-04-04 LAB — POCT URINE PREGNANCY: Preg Test, Ur: NEGATIVE

## 2022-04-04 MED ORDER — FLUCONAZOLE 150 MG PO TABS: 150.0000 mg | ORAL_TABLET | Freq: Once | ORAL | 3 refills | Status: AC

## 2022-04-04 NOTE — Assessment & Plan Note (Addendum)
Abd exam reassuring, given hx of constipation and resolution of pain with defecation, feel that her pain is most likely related to constipation. Pt agreeable to increase fiber intake and potentially try OTC laxatives before using prescription strength laxatives. Given concurrent GU symptoms as above, Ddx also includes PID vs ectopic pregnancy vs appendicitis, but feel these are much less likely given her benign exam findings, lack of systemic symptoms, and negative pregnancy test. F/u as needed

## 2022-04-04 NOTE — Patient Instructions (Signed)
It was wonderful to see you today.  Please bring ALL of your medications with you to every visit.   Updates from today's visit:  Please take one dose of the Diflucan now, then if symptoms do not resolve you can take another tablet after 72 hours. I have sent in refills of this medication in case you have more yeast infections down the road.  I will send you a mychart message or call you with results from today  Please follow up as needed   Thank you for choosing [facility].   Please call [phone] with any questions about today's appointment.  Please be sure to schedule follow up at the front  desk before you leave today.   Vonna Drafts, MD  Family Medicine

## 2022-04-04 NOTE — Assessment & Plan Note (Signed)
White clumpy discharge noted around labia and in vaginal canal. Given recent use of metrogel, feel that vaginal candidiasis is highly likely. Will obtain wet prep and GC/chlamydia, HIV, RPR as well. POCT pregnancy negative. Very low suspicion for PID given lack of systemic symptoms, benign abdominal exam, and lack of cervical motion tenderness on bimanual exam.

## 2022-04-06 LAB — CERVICOVAGINAL ANCILLARY ONLY
Chlamydia: NEGATIVE
Comment: NEGATIVE
Comment: NEGATIVE
Comment: NORMAL
Neisseria Gonorrhea: NEGATIVE
Trichomonas: NEGATIVE

## 2022-04-10 ENCOUNTER — Other Ambulatory Visit: Payer: Self-pay | Admitting: Student

## 2022-04-10 DIAGNOSIS — B9689 Other specified bacterial agents as the cause of diseases classified elsewhere: Secondary | ICD-10-CM

## 2022-05-14 ENCOUNTER — Ambulatory Visit (INDEPENDENT_AMBULATORY_CARE_PROVIDER_SITE_OTHER): Payer: Medicaid Other | Admitting: Student

## 2022-05-14 ENCOUNTER — Ambulatory Visit: Payer: Medicaid Other | Admitting: Student

## 2022-05-14 VITALS — BP 112/76 | HR 76 | Wt 100.5 lb

## 2022-05-14 DIAGNOSIS — R1032 Left lower quadrant pain: Secondary | ICD-10-CM | POA: Diagnosis not present

## 2022-05-14 DIAGNOSIS — G8929 Other chronic pain: Secondary | ICD-10-CM | POA: Diagnosis not present

## 2022-05-14 MED ORDER — DICLOFENAC SODIUM 1 % EX GEL: 4.0000 g | Freq: Four times a day (QID) | CUTANEOUS | 0 refills | Status: DC

## 2022-05-14 NOTE — Progress Notes (Signed)
    SUBJECTIVE:   CHIEF COMPLAINT / HPI: Left groin pain  Patient says for the past year that the left groin has been hurting once a month usually right before her menstrual period or during ovulation.  She says this for started during her pregnancy.  Both sides can hurt but it is usually the left side.  She denies any significant vaginal discharge.  She denies any vomiting or diarrhea.  No fevers.  She says when she gets these events it lasts for about 2 to 3 days. Crossing legs make it uncomfortable.  Not noticed any masses or overlying skin changes around this area.  Has not been taking any medications to help with the pain.  Does have regular periods every month her last period was on 28 December and lasted for a week.  She does have some trouble with bowel movements and has a bowel movement once every week she says sometimes once every 2 weeks but denies any hard stools or straining.  She has been increasing her fiber in her diet which has been helping somewhat.    PERTINENT  PMH / PSH:   OBJECTIVE:   BP 112/76   Pulse 76   Wt 100 lb 8 oz (45.6 kg)   LMP 05/03/2022   SpO2 97%   BMI 19.63 kg/m   General: NAD, pleasant, able to participate in exam Respiratory: Normal effort, no obvious respiratory distress Pelvic: VULVA: normal appearing vulva with no masses, tenderness or lesions, VAGINA: Normal appearing vagina with normal color, no lesions, with scant discharge present, CERVIX: No cervical motion tenderness on examination, no adnexal masses or tenderness on examination during pelvic No overlying skin changes on inguinal region, no masses Extremities: Patient has some reproduction of pain when abducting hips  Chaperone Lavell Anchors CMA present for pelvic exam   ASSESSMENT/PLAN:   Groin pain, chronic, left Left groin pain monthly for the past year since her last pregnancy.  I discussed most likely related to muscular strain and pelvic floor given that the pain is positional.   Other differential includes mittelschmerz given that this pain happens once a month around ovulation time however discussed that musculoskeletal cause more likely.  Does not appear to be PID picture given patient had no tenderness on pelvic examination and no significant discharge -Voltaren gel (patient has rash that develops from oral ibuprofen, I discussed if having any skin irritation to stop this medication but that we can trial this given it is not a systemic medication) -Pelvic floor PT referral -Refilled metronidazole gel    Gerrit Heck, MD [place]

## 2022-05-14 NOTE — Progress Notes (Deleted)
    SUBJECTIVE:   CHIEF COMPLAINT / HPI:   ***  PERTINENT  PMH / PSH: ***  OBJECTIVE:   There were no vitals taken for this visit.  ***  ASSESSMENT/PLAN:   No problem-specific Assessment & Plan notes found for this encounter.     Leslie Dales, [place]

## 2022-05-14 NOTE — Patient Instructions (Addendum)
It was great to see you! Thank you for allowing me to participate in your care!   Our plans for today:  -Refilled your metronidazole gel -This may be ovulation related pain versus a muscular strain.  I would like to refer you to pelvic floor physical therapy which can help alleviate some of this pain -I am going to prescribe you a topic NSAID voltaren to help with pain/inflammation  Take care and seek immediate care sooner if you develop any concerns.  Gerrit Heck, MD

## 2022-05-14 NOTE — Assessment & Plan Note (Signed)
Left groin pain monthly for the past year since her last pregnancy.  I discussed most likely related to muscular strain and pelvic floor given that the pain is positional.  Other differential includes mittelschmerz given that this pain happens once a month around ovulation time however discussed that musculoskeletal cause more likely.  Does not appear to be PID picture given patient had no tenderness on pelvic examination and no significant discharge -Voltaren gel (patient has rash that develops from oral ibuprofen, I discussed if having any skin irritation to stop this medication but that we can trial this given it is not a systemic medication) -Pelvic floor PT referral -Refilled metronidazole gel

## 2022-05-16 ENCOUNTER — Telehealth: Payer: Self-pay

## 2022-05-16 DIAGNOSIS — B9689 Other specified bacterial agents as the cause of diseases classified elsewhere: Secondary | ICD-10-CM

## 2022-05-16 MED ORDER — METRONIDAZOLE 0.75 % VA GEL: 1.0000 | VAGINAL | 3 refills | Status: AC

## 2022-05-16 NOTE — Telephone Encounter (Signed)
Patient calls nurse line regarding metrogel prescription. Patient reports that the pharmacy never received prescription from last office visit.   Please send in new rx to [facility] on [address].   Talbot Grumbling, RN

## 2022-06-19 ENCOUNTER — Ambulatory Visit: Payer: Medicaid Other

## 2022-06-19 NOTE — Progress Notes (Deleted)
    SUBJECTIVE:   CHIEF COMPLAINT / HPI:  No chief complaint on file.   ***  PERTINENT  PMH / PSH: ***  Patient Care Team: Orvis Brill, DO as PCP - General (Family Medicine)   OBJECTIVE:   There were no vitals taken for this visit.  Physical Exam      05/14/2022    2:01 PM  Depression screen PHQ 2/9  Decreased Interest 0  Down, Depressed, Hopeless 0  PHQ - 2 Score 0  Altered sleeping 0  Tired, decreased energy 0  Change in appetite 0  Feeling bad or failure about yourself  0  Trouble concentrating 0  Moving slowly or fidgety/restless 0  Suicidal thoughts 0  PHQ-9 Score 0     {Show previous vital signs (optional):23777}  {Labs  Heme  Chem  Endocrine  Serology  Results Review (optional):23779}  ASSESSMENT/PLAN:   No problem-specific Assessment & Plan notes found for this encounter.    No follow-ups on file.   Zola Button, MD [place]

## 2022-06-19 NOTE — Patient Instructions (Incomplete)
It was nice seeing you today!  Blood work today.  See me in 3 months or whenever is a good for you.  Stay well, Alvaro Aungst, MD [facility] [phone]  --  Make sure to check out at the front desk before you leave today.  Please arrive at least 15 minutes prior to your scheduled appointments.  If you had blood work today, I will send you a MyChart message or a letter if results are normal. Otherwise, I will give you a call.  If you had a referral placed, they will call you to set up an appointment. Please give us a call if you don't hear back in the next 2 weeks.  If you need additional refills before your next appointment, please call your pharmacy first.  

## 2022-07-04 ENCOUNTER — Ambulatory Visit: Payer: Medicaid Other | Admitting: Family Medicine

## 2022-07-04 ENCOUNTER — Encounter: Payer: Self-pay | Admitting: Family Medicine

## 2022-07-04 VITALS — BP 118/85 | HR 79 | Ht 60.0 in | Wt 99.0 lb

## 2022-07-04 DIAGNOSIS — Z3200 Encounter for pregnancy test, result unknown: Secondary | ICD-10-CM

## 2022-07-04 LAB — POCT URINE PREGNANCY: Preg Test, Ur: NEGATIVE

## 2022-07-04 NOTE — Patient Instructions (Addendum)
It was nice seeing you today!  Will check your blood test for pregnancy.  If negative, you can call to schedule an appointment for Nexplanon placement.  Stay well, Zola Button, MD [place] [phone]  --  Make sure to check out at the front desk before you leave today.  Please arrive at least 15 minutes prior to your scheduled appointments.  If you had blood work today, I will send you a MyChart message or a letter if results are normal. Otherwise, I will give you a call.  If you had a referral placed, they will call you to set up an appointment. Please give [place] a call if you don't hear back in the next 2 weeks.  If you need additional refills before your next appointment, please call your pharmacy first.

## 2022-07-04 NOTE — Progress Notes (Signed)
    SUBJECTIVE:   CHIEF COMPLAINT / HPI:  Chief Complaint  Patient presents with   Possible Pregnancy    Usually menses 28-29 days apart, very regular Having some breast tenderness and abdominal cramping but no bleeding yet Wants pregnancy test as she missed her most recent menstrual period,  LMP 1/27 Negative pregnancy test at home on 2/24 Not on contraception, desires Nexplanon if her pregnancy test is negative She will keep pregnancy if pregnancy test is positive  PERTINENT  PMH / PSH:   Patient Care Team: Orvis Brill, DO as PCP - General (Family Medicine)   OBJECTIVE:   BP 118/85   Pulse 79   Ht 5' (1.524 m)   Wt 99 lb (44.9 kg)   LMP 06/02/2022   SpO2 100%   BMI 19.33 kg/m   Physical Exam Constitutional:      General: She is not in acute distress. HENT:     Head: Normocephalic and atraumatic.  Cardiovascular:     Rate and Rhythm: Normal rate and regular rhythm.  Pulmonary:     Effort: Pulmonary effort is normal. No respiratory distress.     Breath sounds: Normal breath sounds.  Musculoskeletal:     Cervical back: Neck supple.  Neurological:     Mental Status: She is alert.        07/04/2022    9:55 AM  Depression screen PHQ 2/9  Decreased Interest 0  Down, Depressed, Hopeless 0  PHQ - 2 Score 0  Altered sleeping 0  Tired, decreased energy 0  Change in appetite 0  Feeling bad or failure about yourself  0  Trouble concentrating 0  Moving slowly or fidgety/restless 0  Suicidal thoughts 0  PHQ-9 Score 0  Difficult doing work/chores Not difficult at all     {Show previous vital signs (optional):23777}    ASSESSMENT/PLAN:   1. Possible pregnancy Baseline regular menses missed last menstrual period by 4 days.  Will obtain serum hCG to rule out pregnancy. - POCT urine pregnancy negative - hCG, quantitative, pregnancy   - patient to schedule for Neplanon if pregnancy test is negative  Return if symptoms worsen or fail to improve.   Zola Button, MD [place]

## 2022-07-05 LAB — BETA HCG QUANT (REF LAB): hCG Quant: <1 m[IU]/mL

## 2022-08-01 ENCOUNTER — Ambulatory Visit: Payer: Medicaid Other | Admitting: Family Medicine

## 2022-08-02 ENCOUNTER — Ambulatory Visit: Payer: Medicaid Other | Admitting: Family Medicine

## 2022-10-02 ENCOUNTER — Ambulatory Visit: Payer: Medicaid Other

## 2022-10-02 ENCOUNTER — Other Ambulatory Visit (HOSPITAL_COMMUNITY)
Admission: RE | Admit: 2022-10-02 | Discharge: 2022-10-02 | Disposition: A | Discharge status: Home / Self Care | Payer: Medicaid Other | Source: Ambulatory Visit | Attending: Family Medicine | Admitting: Family Medicine

## 2022-10-02 ENCOUNTER — Other Ambulatory Visit: Payer: Self-pay | Admitting: Family Medicine

## 2022-10-02 ENCOUNTER — Ambulatory Visit (INDEPENDENT_AMBULATORY_CARE_PROVIDER_SITE_OTHER): Payer: Medicaid Other | Admitting: Family Medicine

## 2022-10-02 ENCOUNTER — Ambulatory Visit: Payer: Medicaid Other | Admitting: Family Medicine

## 2022-10-02 VITALS — BP 115/89 | HR 98 | Ht 60.0 in | Wt 99.4 lb

## 2022-10-02 DIAGNOSIS — N898 Other specified noninflammatory disorders of vagina: Secondary | ICD-10-CM | POA: Diagnosis not present

## 2022-10-02 DIAGNOSIS — B3731 Acute candidiasis of vulva and vagina: Secondary | ICD-10-CM

## 2022-10-02 LAB — POCT WET PREP (WET MOUNT)
Clue Cells Wet Prep Whiff POC: NEGATIVE
Trichomonas Wet Prep HPF POC: ABSENT

## 2022-10-02 MED ORDER — FLUCONAZOLE 150 MG PO TABS: 150.0000 mg | ORAL_TABLET | Freq: Once | ORAL | 0 refills | Status: AC

## 2022-10-02 NOTE — Progress Notes (Signed)
    SUBJECTIVE:   CHIEF COMPLAINT / HPI:   Patient presents with vaginal irritation and discharge about a week ago. She initially did not notice discharge until 2 days ago, appeared clear. Denies fever, chills, dysuria, abdominal or pelvic pain. Denies any new sexual partners. LMP 09/25/2022. Not on birth control but uses condoms each time for contraception.   OBJECTIVE:   BP 115/89   Pulse 98   Ht 5' (1.524 m)   Wt 99 lb 6.4 oz (45.1 kg)   LMP 09/25/2022   SpO2 96%   BMI 19.41 kg/m   General: Patient well-appearing, in no acute distress. Resp: normal work of breathing noted GU: no rashes or lesions noted normal labia noted, whitish-clear cervical discharge noted otherwise normal vagina and cervix, no polyps  GU exam performed in the presence of chaperone, Cleatrice Burke, CMA.   ASSESSMENT/PLAN:   Vaginal discharge -wet prep pending -GC/Chlamydia testing pending -patient desiring blood work as well, HIV and RPR also pending -safe sex counseling provided -emphasized the importance of barrier protection, encouraged her to consider formal birth control and to follow up when she is ready to start     Reece Leader, DO [facility]

## 2022-10-02 NOTE — Assessment & Plan Note (Signed)
-  wet prep pending -GC/Chlamydia testing pending -patient desiring blood work as well, HIV and RPR also pending -safe sex counseling provided -emphasized the importance of barrier protection, encouraged her to consider formal birth control and to follow up when she is ready to start

## 2022-10-02 NOTE — Patient Instructions (Signed)
It was great seeing you today!  Today we discussed your vaginal discharge, we did testing and I will be in touch with the results. Please make sure to use protection.   Please follow up at your next scheduled appointment, if anything arises between now and then, please don't hesitate to contact our office.   Thank you for allowing [place] to be a part of your medical care!  Thank you, Dr. Robyne Peers

## 2022-10-03 LAB — CERVICOVAGINAL ANCILLARY ONLY
Chlamydia: NEGATIVE
Comment: NEGATIVE
Comment: NEGATIVE
Comment: NORMAL
Neisseria Gonorrhea: NEGATIVE
Trichomonas: NEGATIVE

## 2022-10-03 LAB — HIV ANTIBODY (ROUTINE TESTING W REFLEX): HIV Screen 4th Generation wRfx: NONREACTIVE

## 2022-10-03 LAB — RPR: RPR Ser Ql: NONREACTIVE

## 2022-10-16 ENCOUNTER — Ambulatory Visit: Payer: Medicaid Other

## 2022-10-24 ENCOUNTER — Ambulatory Visit (INDEPENDENT_AMBULATORY_CARE_PROVIDER_SITE_OTHER): Payer: Medicaid Other | Admitting: Student

## 2022-10-24 VITALS — BP 111/89 | HR 77 | Temp 98.8°F | Ht 60.0 in | Wt 100.6 lb

## 2022-10-24 DIAGNOSIS — R1013 Epigastric pain: Secondary | ICD-10-CM | POA: Diagnosis not present

## 2022-10-24 MED ORDER — OMEPRAZOLE 20 MG PO CPDR: 20.0000 mg | DELAYED_RELEASE_CAPSULE | Freq: Every day | ORAL | 3 refills | Status: DC

## 2022-10-24 NOTE — Assessment & Plan Note (Signed)
27 year old presenting with dyspepsia and no red flag symptoms. Given report of heartburn, acid reflux and abdominal pain suspect possible GI ulcer.  Will evaluate with H. pylori breath test and start patient on trial of PPI. -Rx omeprazole 20 mg daily -Obtained H. pylori breath test lab -Encourage patient to avoid smoking and spicy foods -Use Tums for breakthrough heartburn

## 2022-10-24 NOTE — Progress Notes (Signed)
    SUBJECTIVE:   CHIEF COMPLAINT / HPI:   Patient is a 27 year old female presenting for navel pain. Patient report minimal pain to be chronic. Previously in the past seen in the provide day who was concerned abdominal pain could be associated with chronic constipation. Patient reports she has not been constipated and this pain has been going on for 3 days. Describes the pain as intermittent dull epigastric abdominal pain that is nonradiating. Pain is worse after eating, pain is not worse with movement. Denies any diarrhea, nausea, vomiting, or blood in the stool.   BM has been normal and not constipated recently. So this hide prolonged and intermittent and gastric regurgitation. She is smokes Hookah and reports skipping meals occasionally  PERTINENT  PMH / PSH: Reviewed  OBJECTIVE:   BP 111/89   Pulse 77   Temp 98.8 F (37.1 C)   Ht 5' (1.524 m)   Wt 100 lb 9.6 oz (45.6 kg)   LMP 10/22/2022 (Exact Date)   SpO2 100%   BMI 19.65 kg/m    Physical Exam General: Alert, well appearing, NAD, Oriented x4 Cardiovascular: RRR, No Murmurs, Normal S2/S2 Respiratory: CTAB, No wheezing or Rales Abdomen: Soft., no distension, positive Bowel sounds. Mild epigastric tenderness Extremities: No edema on extremities   Skin: Warm and dry  ASSESSMENT/PLAN:   Epigastric pain 27 year old presenting with dyspepsia and no red flag symptoms. Given report of heartburn, acid reflux and abdominal pain suspect possible GI ulcer.  Will evaluate with H. pylori breath test and start patient on trial of PPI. -Rx omeprazole 20 mg daily -Obtained H. pylori breath test lab -Encourage patient to avoid smoking and spicy foods -Use Tums for breakthrough heartburn     Jerre Simon, MD [facility]

## 2022-10-24 NOTE — Patient Instructions (Signed)
It was wonderful to meet you today. Thank you for allowing me to be a part of your care. Below is a short summary of what we discussed at your visit today:  Seem to have some heartburn and gastric reflux.  You could possibly have a stomach ulcer which can present with pain like this.  They will test you for H. pylori which sometimes can cause ulcer in the stomach.  I have sent in prescription for omeprazole which you will take once a daily before meals for 2 months.  So you can continue to use your Tums for any pain that occur during the day after you've taken the omeprazole.  Avoid spicy food or smoking, you can also make sure to tilt your bed up a little bit if you feel like you are regurgitating the food.  Ensure you are staying well-hydrated with water.   If you have any questions or concerns, please do not hesitate to contact us via phone or MyChart message.   Jerre Simon, MD [name]

## 2022-10-26 LAB — H. PYLORI BREATH TEST: H pylori Breath Test: NEGATIVE

## 2022-11-21 ENCOUNTER — Encounter: Payer: Self-pay | Admitting: Family Medicine

## 2022-11-21 ENCOUNTER — Ambulatory Visit (INDEPENDENT_AMBULATORY_CARE_PROVIDER_SITE_OTHER): Payer: Medicaid Other | Admitting: Family Medicine

## 2022-11-21 VITALS — BP 113/72 | HR 69 | Ht 60.0 in | Wt 102.0 lb

## 2022-11-21 DIAGNOSIS — N939 Abnormal uterine and vaginal bleeding, unspecified: Secondary | ICD-10-CM

## 2022-11-21 LAB — POCT URINE PREGNANCY: Preg Test, Ur: NEGATIVE

## 2022-11-21 NOTE — Progress Notes (Signed)
  Date of Visit: 11/21/2022   SUBJECTIVE:   HPI:  Erin Wilkins presents today for menstrual irregularity.  Typically her cycles are exactly 28 to 29 days in length.  She is intermittently sexually active, last about 3 weeks ago, around the time she was ovulating.  This past Sunday marked the 28th day of her cycle, when she was due to start her period.  On Thursday she began having pink spotting which became brown and continued until she started her cycle on Sunday.  Denies any pelvic pain.  Took upreg at home on Friday and was negative.  Bleeding seems a little bit lighter than is normal for her.  She is not using anything for birth control and declines contraception at this time.  She is open to the idea of pregnancy.   OBJECTIVE:   BP 113/72   Pulse 69   Ht 5' (1.524 m)   Wt 102 lb (46.3 kg)   LMP 11/15/2022 (Exact Date)   SpO2 100%   BMI 19.92 kg/m  Gen: No acute distress, pleasant, cooperative Lungs: Normal effort on room air Psych: normal range of affect, well groomed, speech normal in rate and volume, normal eye contact   ASSESSMENT/PLAN:   Menstrual irregularity Essentially began menses a few days early.  Upreg negative here today.  Discussed with patient this is most likely normal slight irregularity to her period.  She does endorse being recently under stress, likely contributing.  Advised she continues to monitor her cycles as she is doing, follow-up if this issue persists.  She is agreeable to this plan.  [place] J. Pollie Meyer, MD [facility]

## 2022-12-03 NOTE — Progress Notes (Deleted)
    SUBJECTIVE:   CHIEF COMPLAINT / HPI:   Erin Wilkins is a 27 year old female here to discuss panic attacks. She says that she notices she feels***when***. She says that things that make her feel better include*** They are/are not associated with stressful events*** SI/HI*** Recent life stressors***   PERTINENT  PMH / PSH: ***  OBJECTIVE:   LMP 11/15/2022 (Exact Date)  ***   ASSESSMENT/PLAN:   No problem-specific Assessment & Plan notes found for this encounter.     Darral Dash, DO  [facility]    {    This will disappear when note is signed, click to select method of visit    :1}

## 2022-12-04 ENCOUNTER — Ambulatory Visit: Payer: Medicaid Other | Admitting: Student

## 2022-12-16 ENCOUNTER — Inpatient Hospital Stay (HOSPITAL_COMMUNITY)
Admission: AD | Admit: 2022-12-16 | Discharge: 2022-12-16 | Disposition: A | Discharge status: Home / Self Care | Payer: Medicaid Other | Attending: Obstetrics & Gynecology | Admitting: Obstetrics & Gynecology

## 2022-12-16 DIAGNOSIS — R55 Syncope and collapse: Secondary | ICD-10-CM | POA: Diagnosis present

## 2022-12-16 DIAGNOSIS — M5459 Other low back pain: Secondary | ICD-10-CM | POA: Diagnosis present

## 2022-12-16 DIAGNOSIS — Z3202 Encounter for pregnancy test, result negative: Secondary | ICD-10-CM | POA: Diagnosis not present

## 2022-12-16 DIAGNOSIS — N939 Abnormal uterine and vaginal bleeding, unspecified: Secondary | ICD-10-CM | POA: Diagnosis present

## 2022-12-16 DIAGNOSIS — N926 Irregular menstruation, unspecified: Secondary | ICD-10-CM | POA: Insufficient documentation

## 2022-12-16 LAB — POCT PREGNANCY, URINE: Preg Test, Ur: NEGATIVE

## 2022-12-16 NOTE — MAU Note (Signed)
.  Erin Wilkins is a 27 y.o. at Unknown here in MAU reporting: faint positive hpt yesterday. Test in mau is neg.  Pt reports irreg periods and hx of "false positive home preg tests".  States she has been spotting for 3 days and has some lower back pain that comes and goes.  LMP: 11/18/22 Onset of complaint: 3 days  Vitals:   12/16/22 1438  BP: 108/67  Pulse: 84  Resp: 16  Temp: 98.5 F (36.9 C)  SpO2: 100%      Lab orders placed from triage:   UPT

## 2022-12-16 NOTE — MAU Provider Note (Signed)
Event Date/Time   First Provider Initiated Contact with Patient 12/16/22 1442      S Erin Wilkins is a 27 y.o. [phone] patient who presents to MAU today with complaint of possible pregnancy. She states she had a positive pregnancy test at home and had some spotting. She denies any pain.    O BP 108/67 (BP Location: Right Arm)   Pulse 84   Temp 98.5 F (36.9 C) (Oral)   Resp 16   LMP 11/18/2022 (Exact Date)   SpO2 100%  Physical Exam Vitals and nursing note reviewed.  Constitutional:      General: She is not in acute distress.    Appearance: She is well-developed.  HENT:     Head: Normocephalic.  Eyes:     Pupils: Pupils are equal, round, and reactive to light.  Cardiovascular:     Rate and Rhythm: Normal rate and regular rhythm.     Heart sounds: Normal heart sounds.  Pulmonary:     Effort: Pulmonary effort is normal. No respiratory distress.     Breath sounds: Normal breath sounds.  Abdominal:     General: Bowel sounds are normal. There is no distension.     Palpations: Abdomen is soft.     Tenderness: There is no abdominal tenderness.  Skin:    General: Skin is warm and dry.  Neurological:     Mental Status: She is alert and oriented to person, place, and time.  Psychiatric:        Mood and Affect: Mood normal.        Behavior: Behavior normal.        Thought Content: Thought content normal.        Judgment: Judgment normal.     A Medical screening exam complete 1. Negative pregnancy test   2. Irregular periods    -Offered HCG due to positive at home and negative here. Patient declines and states she can follow up with her gyn  P Discharge from MAU in stable condition Patient given the option of transfer to [facility] for further evaluation or seek care in outpatient facility of choice  List of options for follow-up given  Warning signs for worsening condition that would warrant emergency follow-up discussed Patient may return to MAU as needed   Rolm Bookbinder, [place] 12/16/2022 2:43 PM

## 2022-12-31 ENCOUNTER — Encounter: Payer: Self-pay | Admitting: Family Medicine

## 2022-12-31 ENCOUNTER — Ambulatory Visit (INDEPENDENT_AMBULATORY_CARE_PROVIDER_SITE_OTHER): Payer: Medicaid Other | Admitting: Family Medicine

## 2022-12-31 VITALS — BP 92/60 | Ht 60.0 in | Wt 105.0 lb

## 2022-12-31 DIAGNOSIS — L989 Disorder of the skin and subcutaneous tissue, unspecified: Secondary | ICD-10-CM | POA: Diagnosis not present

## 2022-12-31 DIAGNOSIS — H5203 Hypermetropia, bilateral: Secondary | ICD-10-CM | POA: Diagnosis not present

## 2022-12-31 MED ORDER — HYDROCORTISONE 2.5 % EX OINT: TOPICAL_OINTMENT | Freq: Two times a day (BID) | CUTANEOUS | 0 refills | Status: DC

## 2022-12-31 NOTE — Patient Instructions (Signed)
I have sent in some steroid cream to apply to the areas until resolution. These could be poison ivy or bug bites Let me know if these areas get worse or not better with the steroid

## 2022-12-31 NOTE — Progress Notes (Signed)
    SUBJECTIVE:   CHIEF COMPLAINT / HPI:   Possible poison ivy Was out with her friends on Saturday night when she fell in a wooded area. After she got home the next day, she noticed an itchy place on her R hand and L leg. Has since had more spots pop up on her back and others on her L leg. Spots are itchy mainly when being scratched. No pain. No bleeding or drainage. No known contacts or bug bites, but she was around her grandma's dogs and feels she could have been exposed to bugs. No fevers. Never had before.  OBJECTIVE:   BP 92/60   Ht 5' (1.524 m)   Wt 105 lb (47.6 kg)   LMP 12/16/2022 (Exact Date)   BMI 20.51 kg/m   General: Alert and oriented, in NAD Skin: Warm, dry, and intact; multiple small flesh-colored papules/?vesicles along R 3rd digit with smaller crops of 1-2 lesions in the inner L thigh near knee and farther up inner thigh towards inguinal area as well as the R foot HEENT: NCAT, EOM grossly normal, midline nasal septum Respiratory: Breathing and speaking comfortably on RA Extremities: Moves all extremities grossly equally Neurological: No gross focal deficit Psychiatric: Appropriate mood and affect     ASSESSMENT/PLAN:   Skin lesion History and exam consistent with contact dermatitis vs insect bite. Less likely scabies or herpetic whitlow based on distribution. Will send in hydrocortisone 2.5% cream to be applied BID until resolution. Return precautions of continued spread, increased itchiness, and more pain discussed.  Erin Holmes, MD [facility]

## 2023-01-08 ENCOUNTER — Ambulatory Visit: Payer: Medicaid Other | Admitting: Family Medicine

## 2023-01-09 ENCOUNTER — Ambulatory Visit (INDEPENDENT_AMBULATORY_CARE_PROVIDER_SITE_OTHER): Payer: Medicaid Other | Admitting: Family Medicine

## 2023-01-09 ENCOUNTER — Other Ambulatory Visit (HOSPITAL_COMMUNITY)
Admission: RE | Admit: 2023-01-09 | Discharge: 2023-01-09 | Disposition: A | Discharge status: Home / Self Care | Payer: Medicaid Other | Source: Ambulatory Visit | Attending: Family Medicine | Admitting: Family Medicine

## 2023-01-09 VITALS — BP 115/82 | HR 73 | Ht 60.0 in | Wt 105.0 lb

## 2023-01-09 DIAGNOSIS — N920 Excessive and frequent menstruation with regular cycle: Secondary | ICD-10-CM | POA: Diagnosis not present

## 2023-01-09 LAB — POCT URINE PREGNANCY: Preg Test, Ur: NEGATIVE

## 2023-01-09 NOTE — Patient Instructions (Addendum)
Your urine pregnancy is pending. I will let you know what your swabs show. If you'd like contraception in the future, I would be happy to chat with you about this!

## 2023-01-10 NOTE — Progress Notes (Signed)
    SUBJECTIVE:   CHIEF COMPLAINT / HPI:   Increased spotting Patient usually has very regular periods every 28 days.  She recently ended her period on 8/23.  She noted she had some spotting with pink/brown color on 8/28 that lasted for couple days.  She had spotting again of the same quality on 9/2.  She is not spotting today  She denies any pain or discomfort with this.  No other discharge. She is currently sexually active with a female partner and is not on contraception; last sexual activity was 3 weeks ago and does not believe it was particularly traumatic.  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  OBJECTIVE:   BP 115/82   Pulse 73   Ht 5' (1.524 m)   Wt 105 lb (47.6 kg)   LMP 12/16/2022 (Exact Date)   SpO2 100%   BMI 20.51 kg/m   General: Alert and oriented, in NAD Skin: Warm, dry, and intact without lesions HEENT: NCAT, EOM grossly normal, midline nasal septum Respiratory: Breathing and speaking comfortably on RA Extremities: Moves all extremities grossly equally Neurological: No gross focal deficit Psychiatric: Appropriate mood and affect   ASSESSMENT/PLAN:   Increased spotting History and exam could be indicative of pregnancy versus bacterial vaginosis versus other STI.  Reassured by lack of other symptoms.  Pregnancy test in office today was negative.  Patient prefers to self swab for STIs and BV; will send off swab and follow-up results.  Also discussed initiating contraception, and patient states she will contact us when she would like to proceed.   Health maintenance Consider obtaining Pap smear at next visit with PCP  Janeal Holmes, MD [facility]

## 2023-01-11 ENCOUNTER — Encounter: Payer: Self-pay | Admitting: Family Medicine

## 2023-01-11 LAB — CERVICOVAGINAL ANCILLARY ONLY
Bacterial Vaginitis (gardnerella): POSITIVE — AB
Candida Glabrata: NEGATIVE
Candida Vaginitis: NEGATIVE
Chlamydia: NEGATIVE
Comment: NEGATIVE
Comment: NEGATIVE
Comment: NEGATIVE
Comment: NEGATIVE
Comment: NEGATIVE
Comment: NORMAL
Neisseria Gonorrhea: NEGATIVE
Trichomonas: NEGATIVE

## 2023-01-11 MED ORDER — METRONIDAZOLE 0.75 % VA GEL: 1.0000 | Freq: Every day | VAGINAL | 0 refills | Status: AC

## 2023-01-11 NOTE — Progress Notes (Addendum)
Patient positive for BV. Likely cause of symptoms. Will send metrogel given intolerance to oral flagyl in the past. Will send message on MyChart on patient's results. Consider boric acid in the future if continues to have recurrent infections.

## 2023-01-11 NOTE — Addendum Note (Signed)
Addended by: Evette Georges B on: 01/11/2023 03:09 PM   Modules accepted: Orders

## 2023-01-16 ENCOUNTER — Inpatient Hospital Stay (HOSPITAL_COMMUNITY): Payer: Medicaid Other

## 2023-01-16 ENCOUNTER — Encounter (HOSPITAL_COMMUNITY): Payer: Self-pay | Admitting: *Deleted

## 2023-01-16 ENCOUNTER — Inpatient Hospital Stay (HOSPITAL_COMMUNITY)
Admission: AD | Admit: 2023-01-16 | Discharge: 2023-01-16 | Disposition: A | Discharge status: Home / Self Care | Payer: Medicaid Other | Attending: Obstetrics & Gynecology | Admitting: Obstetrics & Gynecology

## 2023-01-16 DIAGNOSIS — O26891 Other specified pregnancy related conditions, first trimester: Secondary | ICD-10-CM | POA: Diagnosis not present

## 2023-01-16 DIAGNOSIS — Z3A01 Less than 8 weeks gestation of pregnancy: Secondary | ICD-10-CM | POA: Diagnosis not present

## 2023-01-16 DIAGNOSIS — R1032 Left lower quadrant pain: Secondary | ICD-10-CM | POA: Diagnosis not present

## 2023-01-16 DIAGNOSIS — O3680X0 Pregnancy with inconclusive fetal viability, not applicable or unspecified: Secondary | ICD-10-CM | POA: Diagnosis not present

## 2023-01-16 DIAGNOSIS — B9689 Other specified bacterial agents as the cause of diseases classified elsewhere: Secondary | ICD-10-CM | POA: Diagnosis not present

## 2023-01-16 DIAGNOSIS — Z113 Encounter for screening for infections with a predominantly sexual mode of transmission: Secondary | ICD-10-CM | POA: Diagnosis not present

## 2023-01-16 DIAGNOSIS — O26851 Spotting complicating pregnancy, first trimester: Secondary | ICD-10-CM | POA: Diagnosis not present

## 2023-01-16 DIAGNOSIS — Z3A00 Weeks of gestation of pregnancy not specified: Secondary | ICD-10-CM | POA: Diagnosis not present

## 2023-01-16 DIAGNOSIS — R102 Pelvic and perineal pain: Secondary | ICD-10-CM

## 2023-01-16 DIAGNOSIS — O209 Hemorrhage in early pregnancy, unspecified: Secondary | ICD-10-CM

## 2023-01-16 DIAGNOSIS — O23591 Infection of other part of genital tract in pregnancy, first trimester: Secondary | ICD-10-CM | POA: Insufficient documentation

## 2023-01-16 LAB — COMPREHENSIVE METABOLIC PANEL
ALT: 13 U/L (ref 0–44)
AST: 16 U/L (ref 15–41)
Albumin: 3.6 g/dL (ref 3.5–5.0)
Alkaline Phosphatase: 42 U/L (ref 38–126)
Anion gap: 7 (ref 5–15)
BUN: 11 mg/dL (ref 6–20)
CO2: 22 mmol/L (ref 22–32)
Calcium: 8.7 mg/dL — ABNORMAL LOW (ref 8.9–10.3)
Chloride: 106 mmol/L (ref 98–111)
Creatinine, Ser: 0.66 mg/dL (ref 0.44–1.00)
GFR, Estimated: >60 mL/min (ref >60)
Glucose, Bld: 94 mg/dL (ref 70–99)
Potassium: 3.7 mmol/L (ref 3.5–5.1)
Sodium: 135 mmol/L (ref 135–145)
Total Bilirubin: 0.6 mg/dL (ref 0.3–1.2)
Total Protein: 6.9 g/dL (ref 6.5–8.1)

## 2023-01-16 LAB — CBC
HCT: 34.8 % — ABNORMAL LOW (ref 36.0–46.0)
Hemoglobin: 11.9 g/dL — ABNORMAL LOW (ref 12.0–15.0)
MCH: 31.1 pg (ref 26.0–34.0)
MCHC: 34.2 g/dL (ref 30.0–36.0)
MCV: 90.9 fL (ref 80.0–100.0)
Platelets: 284 10*3/uL (ref 150–400)
RBC: 3.83 MIL/uL — ABNORMAL LOW (ref 3.87–5.11)
RDW: 13.1 % (ref 11.5–15.5)
WBC: 7.8 10*3/uL (ref 4.0–10.5)
nRBC: 0 % (ref 0.0–0.2)

## 2023-01-16 LAB — HCG, QUANTITATIVE, PREGNANCY: hCG, Beta Chain, Quant, S: 1065 m[IU]/mL — ABNORMAL HIGH (ref <5)

## 2023-01-16 LAB — ABO/RH: ABO/RH(D): O POS

## 2023-01-16 LAB — POCT PREGNANCY, URINE: Preg Test, Ur: POSITIVE — AB

## 2023-01-16 NOTE — Discharge Instructions (Signed)
You were seen in the MAU for abdominal pain. We did blood tests and an ultrasound. You are pregnant, but at present we are not sure if you have an ectopic pregnancy, a normal pregnancy, or a miscarriage, though based on the ultrasound it is likely a normal pregnancy. To determine this you will need to have a repeat test of your pregnancy hormone level done in two days. We will schedule you an appointment to have your blood drawn at the [facility] on [address]. Please go there on Friday morning before 10 AM to have your blood drawn..   If you develop severe and progressively worsening abdominal pain, heavy vaginal bleeding soaking >1 pad/hour, or fever, return to the MAU or the nearest hospital immediately.

## 2023-01-16 NOTE — MAU Note (Signed)
.  Erin Wilkins is a 27 y.o. at Unknown here in MAU reporting: has been spotting for 3 weeks. Had a positive HPt prior to going to PCP last week. They did da pregnancy test and it was negative and they dx her with BV and gave her Metrogel. Pt went home and retook pregnancy test and it was positive again. Pregnancy test is positive today in MAU. Pt still having spotting and c/o pain in cramping on LLQ pain sometime is high as a 10. LMP: 12/16/2022 Onset of complaint: 3 weeks Pain score: 7-10 There were no vitals filed for this visit.   FHT:n/a Lab orders placed from triage:  upt

## 2023-01-16 NOTE — MAU Provider Note (Signed)
History     [id number]  Arrival date and time: 01/16/23 0630    Chief Complaint  Patient presents with   Vaginal Bleeding   Abdominal Pain     HPI Erin Wilkins is a 27 y.o. at [redacted]w[redacted]d by sure LMP with PMHx notable for chronic L groin pain (per problem list), on prior vaginal delivery, who presents for L groin pain and positive home pregnancy test.   She reports she went to PCP last week as she'd had a positive home pregnancy test and had been spotting for about two weeks at that time UPT there was negative, STI testing obtained and was + only for BV, rx sent for metrogel Today reports she has been having sharp intermittent pulling and stabbing pains in her left groin area Continues to have vaginal spotting No vaginal discharge or burning or pain with urination currently Denies any history of ectopic or otherwise abnormal pregnancies Unplanned but welcome pregnancy  --/--/O POS (09/11 0930)  OB History     Gravida  5   Para  1   Term  1   Preterm      AB  2   Living  1      SAB      IAB  2   Ectopic      Multiple  0   Live Births  1           Past Medical History:  Diagnosis Date   Anemia    Irregular uterine bleeding 04/24/2018   UTI (urinary tract infection)    Vaginal candidiasis 07/08/2019    Past Surgical History:  Procedure Laterality Date   NO PAST SURGERIES      Family History  Problem Relation Age of Onset   Hypertension Mother     Social History   Socioeconomic History   Marital status: Single    Spouse name: Boyfriend - Christian   Number of children: 0   Years of education: 12   Highest education level: Not on file  Occupational History   Occupation: Research officer, political party: [employer]  Tobacco Use   Smoking status: Never    Passive exposure: Never   Smokeless tobacco: Never  Vaping Use   Vaping status: Never Used  Substance and Sexual Activity   Alcohol use: No   Drug use: No   Sexual activity: Yes   Other Topics Concern   Not on file  Social History Narrative   Erin Wilkins lives with her mother and older sister in an apartment. She plans to return to living there following delivery until she is financially stable. FOB and boyfriend Ephriam Knuckles is involved and supportive. Valary graduated from highschool spring 2016 and was working at [employer] prior to pregnancy. Plans to return to work following delivery, though at a different establishment.   Social Determinants of Health   Financial Resource Strain: Not on file  Food Insecurity: Not on file  Transportation Needs: Not on file  Physical Activity: Not on file  Stress: Not on file  Social Connections: Not on file  Intimate Partner Violence: Not on file    Allergies  Allergen Reactions   Metronidazole Nausea And Vomiting    To PO form (while pregnant)   Amoxicillin Hives and Rash    Rxn about 1 yr ago   Ancef [Cefazolin] Rash    Perioral rash   Ibuprofen Rash   Penicillins Hives and Rash    Has patient had a PCN  reaction causing immediate rash, facial/tongue/throat swelling, SOB or lightheadedness with hypotension: No Has patient had a PCN reaction causing severe rash involving mucus membranes or skin necrosis: No Has patient had a PCN reaction that required hospitalization: No Has patient had a PCN reaction occurring within the last 10 years: Yes If all of the above answers are "NO", then may proceed with Cephalosporin use.     No current facility-administered medications on file prior to encounter.   Current Outpatient Medications on File Prior to Encounter  Medication Sig Dispense Refill   diclofenac Sodium (VOLTAREN ARTHRITIS PAIN) 1 % GEL Apply 4 g topically 4 (four) times daily. 50 g 0   hydrocortisone 2.5 % ointment Apply topically 2 (two) times daily. Until resolution. 30 g 0   metroNIDAZOLE (METROGEL) 0.75 % vaginal gel Place 1 Applicatorful vaginally at bedtime for 5 days. 50 g 0   omeprazole (PRILOSEC) 20 MG capsule  Take 1 capsule (20 mg total) by mouth daily. 30 capsule 3     ROS Pertinent positives and negative per HPI, all others reviewed and negative  Physical Exam   BP 116/75   Pulse 87   Temp 98.1 F (36.7 C)   Resp 18   Ht 5' (1.524 m)   Wt 48.5 kg   LMP 12/16/2022 (Exact Date)   BMI 20.90 kg/m   Patient Vitals for the past 24 hrs:  BP Temp Pulse Resp Height Weight  01/16/23 0835 116/75 98.1 F (36.7 C) 87 18 5' (1.524 m) 48.5 kg    Physical Exam Vitals reviewed.  Constitutional:      General: She is not in acute distress.    Appearance: She is well-developed. She is not diaphoretic.  Eyes:     General: No scleral icterus. Pulmonary:     Effort: Pulmonary effort is normal. No respiratory distress.  Abdominal:     General: There is no distension.     Palpations: Abdomen is soft.     Tenderness: There is no abdominal tenderness. There is no guarding or rebound.  Skin:    General: Skin is warm and dry.  Neurological:     Mental Status: She is alert.     Coordination: Coordination normal.      Cervical Exam    Bedside Ultrasound Not performed.  My interpretation: n/a  Labs Results for orders placed or performed during the hospital encounter of 01/16/23 (from the past 24 hour(s))  Pregnancy, urine POC     Status: Abnormal   Collection Time: 01/16/23  8:19 AM  Result Value Ref Range   Preg Test, Ur POSITIVE (A) NEGATIVE  ABO/Rh     Status: None   Collection Time: 01/16/23  9:30 AM  Result Value Ref Range   ABO/RH(D) O POS    No rh immune globuloin      NOT A RH IMMUNE GLOBULIN CANDIDATE, PT RH POSITIVE Performed at [facility], [address] [place], [place] [address]   CBC     Status: Abnormal   Collection Time: 01/16/23  9:31 AM  Result Value Ref Range   WBC 7.8 4.0 - 10.5 K/uL   RBC 3.83 (L) 3.87 - 5.11 MIL/uL   Hemoglobin 11.9 (L) 12.0 - 15.0 g/dL   HCT 60.4 (L) 54.0 - 98.1 %   MCV 90.9 80.0 - 100.0 fL   MCH 31.1 26.0 - 34.0 pg   MCHC  34.2 30.0 - 36.0 g/dL   RDW 19.1 47.8 - 29.5 %  Platelets 284 150 - 400 K/uL   nRBC 0.0 0.0 - 0.2 %  Comprehensive metabolic panel     Status: Abnormal   Collection Time: 01/16/23  9:31 AM  Result Value Ref Range   Sodium 135 135 - 145 mmol/L   Potassium 3.7 3.5 - 5.1 mmol/L   Chloride 106 98 - 111 mmol/L   CO2 22 22 - 32 mmol/L   Glucose, Bld 94 70 - 99 mg/dL   BUN 11 6 - 20 mg/dL   Creatinine, Ser 1.01 0.44 - 1.00 mg/dL   Calcium 8.7 (L) 8.9 - 10.3 mg/dL   Total Protein 6.9 6.5 - 8.1 g/dL   Albumin 3.6 3.5 - 5.0 g/dL   AST 16 15 - 41 U/L   ALT 13 0 - 44 U/L   Alkaline Phosphatase 42 38 - 126 U/L   Total Bilirubin 0.6 0.3 - 1.2 mg/dL   GFR, Estimated >75 >10 mL/min   Anion gap 7 5 - 15  hCG, quantitative, pregnancy     Status: Abnormal   Collection Time: 01/16/23  9:31 AM  Result Value Ref Range   hCG, Beta Chain, Quant, S 1,065 (H) <5 mIU/mL    Imaging US OB LESS THAN 14 WEEKS WITH OB TRANSVAGINAL  Result Date: 01/16/2023 CLINICAL DATA:  First trimester pregnancy with vaginal spotting and left lower quadrant pain for 3 days. LMP 09/22/2023. EXAM: OBSTETRIC <14 WK [place] AND TRANSVAGINAL OB US TECHNIQUE: Both transabdominal and transvaginal ultrasound examinations were performed for complete evaluation of the gestation as well as the maternal uterus, adnexal regions, and pelvic cul-de-sac. Transvaginal technique was performed to assess early pregnancy. COMPARISON:  Obstetric ultrasound 09/24/2020. FINDINGS: Intrauterine gestational sac: Mildly heterogeneous thickening of the endometrium to 2.0 cm. There are a few small cystic areas within the endometrium, but no convincing intrauterine gestational sac. Yolk sac:  Not visualized. Embryo:  Not visualized. Cardiac Activity: Not visualized. Maternal uterus/adnexae: Both maternal ovaries are visualized and are within normal limits. No suspicious adnexal mass. Trace free pelvic fluid. IMPRESSION: No definite intrauterine gestational sac, yolk  sac, fetal pole, or cardiac activity visualized. Differential considerations include intrauterine gestation too early to be sonographically visualized, spontaneous abortion, or ectopic pregnancy. Consider follow-up ultrasound in 14 days and serial quantitative beta HCG follow-up. Electronically Signed   By: Carey Bullocks M.D.   On: 01/16/2023 11:10    MAU Course  Procedures Lab Orders         CBC         Comprehensive metabolic panel         hCG, quantitative, pregnancy         Pregnancy, urine POC    No orders of the defined types were placed in this encounter.  Imaging Orders         US OB LESS THAN 14 WEEKS WITH OB TRANSVAGINAL         US OB LESS THAN 14 WEEKS WITH OB TRANSVAGINAL     MDM Moderate (Level 3-4)  Assessment and Plan  #Abdominal pain in pregnancy, first trimester, Pregnancy of unknown location, Vaginal bleeding in pregnancy, first trimester #[redacted] weeks gestation of pregnancy Pregnancy of unknown location, though [place] highly suggestive of IUP and possibly multigestational. Discussed with patient that the possibilities include ectopic pregnancy, normal pregnancy, or miscarriage. We discussed need to trend HCG to help determine the outcome of this pregnancy. Scheduled for repeat HCG at [facility] in two days. Emphasized that ectopic pregnancy is a  life threatening condition if not diagnosed and treated in a timely manner. Reviewed MAU return precautions of severe and progressively worsening abdominal pain, heavy vaginal bleeding soaking >1 pad/hour, or fever. Rh +, rhogam not indicated.    Dispo: discharged to home in stable condition    Venora Maples, MD/MPH 01/16/23 11:51 AM  Allergies as of 01/16/2023       Reactions   Metronidazole Nausea And Vomiting   To PO form (while pregnant)   Amoxicillin Hives, Rash   Rxn about 1 yr ago   Ancef [cefazolin] Rash   Perioral rash   Ibuprofen Rash   Penicillins Hives, Rash   Has patient had a PCN reaction  causing immediate rash, facial/tongue/throat swelling, SOB or lightheadedness with hypotension: No Has patient had a PCN reaction causing severe rash involving mucus membranes or skin necrosis: No Has patient had a PCN reaction that required hospitalization: No Has patient had a PCN reaction occurring within the last 10 years: Yes If all of the above answers are "NO", then may proceed with Cephalosporin use.        Medication List     STOP taking these medications    diclofenac Sodium 1 % Gel Commonly known as: Voltaren Arthritis Pain       TAKE these medications    hydrocortisone 2.5 % ointment Apply topically 2 (two) times daily. Until resolution.   metroNIDAZOLE 0.75 % vaginal gel Commonly known as: METROGEL Place 1 Applicatorful vaginally at bedtime for 5 days.   omeprazole 20 MG capsule Commonly known as: PRILOSEC Take 1 capsule (20 mg total) by mouth daily.

## 2023-01-18 ENCOUNTER — Ambulatory Visit: Payer: Medicaid Other

## 2023-01-29 ENCOUNTER — Telehealth: Payer: Self-pay

## 2023-01-29 NOTE — Telephone Encounter (Signed)
Received message from front office staff that patient would like to schedule appointment for abdominal pain.   Per patient's LMP, she is approx [redacted] weeks pregnant.   Patient reports significant abdominal pain after urination.    Denies fever or chills, vaginal bleeding or hematuria.   Spoke with Dr. Miquel Dunn regarding patient and concern. She advised that patient be seen in the maternity assessment unit at [facility] hospital.   Informed patient of provider recommendation. She verbalizes understanding.   Veronda Prude, RN

## 2023-01-30 ENCOUNTER — Ambulatory Visit: Payer: Medicaid Other

## 2023-01-30 ENCOUNTER — Other Ambulatory Visit: Payer: Self-pay

## 2023-01-30 DIAGNOSIS — O3680X0 Pregnancy with inconclusive fetal viability, not applicable or unspecified: Secondary | ICD-10-CM

## 2023-01-30 DIAGNOSIS — O26891 Other specified pregnancy related conditions, first trimester: Secondary | ICD-10-CM | POA: Diagnosis not present

## 2023-01-30 DIAGNOSIS — O209 Hemorrhage in early pregnancy, unspecified: Secondary | ICD-10-CM | POA: Diagnosis not present

## 2023-01-30 DIAGNOSIS — Z3A01 Less than 8 weeks gestation of pregnancy: Secondary | ICD-10-CM

## 2023-01-30 DIAGNOSIS — R102 Pelvic and perineal pain: Secondary | ICD-10-CM | POA: Diagnosis not present

## 2023-02-04 ENCOUNTER — Telehealth: Payer: Self-pay

## 2023-02-04 NOTE — Telephone Encounter (Signed)
Patient calls nurse line requesting medication for morning sickness.   She reports she is ~[redacted] weeks along and "can not keep anything down." She reports her initial OB is scheduled for 10/7. However, she reports she can not wait that long.   Advised to eat small meals and frequently throughout the day.   Advised to stay well hydrated.   Will forward to PCP.

## 2023-02-07 ENCOUNTER — Inpatient Hospital Stay (HOSPITAL_COMMUNITY)
Admission: AD | Admit: 2023-02-07 | Discharge: 2023-02-08 | Disposition: A | Discharge status: Home / Self Care | Payer: Medicaid Other | Attending: Obstetrics and Gynecology | Admitting: Obstetrics and Gynecology

## 2023-02-07 DIAGNOSIS — O219 Vomiting of pregnancy, unspecified: Secondary | ICD-10-CM | POA: Diagnosis not present

## 2023-02-07 DIAGNOSIS — E860 Dehydration: Secondary | ICD-10-CM | POA: Insufficient documentation

## 2023-02-07 DIAGNOSIS — Z3A01 Less than 8 weeks gestation of pregnancy: Secondary | ICD-10-CM | POA: Insufficient documentation

## 2023-02-07 DIAGNOSIS — O99281 Endocrine, nutritional and metabolic diseases complicating pregnancy, first trimester: Secondary | ICD-10-CM | POA: Diagnosis not present

## 2023-02-07 LAB — URINALYSIS, ROUTINE W REFLEX MICROSCOPIC
Bilirubin Urine: NEGATIVE
Glucose, UA: NEGATIVE mg/dL
Hgb urine dipstick: NEGATIVE
Ketones, ur: 80 mg/dL — AB
Nitrite: NEGATIVE
Protein, ur: 30 mg/dL — AB
Specific Gravity, Urine: 1.027 (ref 1.005–1.030)
pH: 6 (ref 5.0–8.0)

## 2023-02-07 NOTE — MAU Note (Signed)
.  Erin Wilkins is a 27 y.o. at [redacted]w[redacted]d here in MAU reporting not eating anything in a week. When I try to eat nothing stays down. The only thing I tolerate is Pepsi. She feels like she has a nasty taste in her mouth which makes everything taste awful. No vag bleeding. No nausea meds. NO VB or other concerns  Onset of complaint: 1 week Pain score: 0 Vitals:   02/07/23 2109 02/07/23 2112  BP:  113/71  Pulse: 82   Resp: 17   Temp: 98.3 F (36.8 C)   SpO2: 100%      FHT:n/a Lab orders placed from triage:  u/a

## 2023-02-08 ENCOUNTER — Encounter (HOSPITAL_COMMUNITY): Payer: Self-pay | Admitting: Obstetrics and Gynecology

## 2023-02-08 DIAGNOSIS — E860 Dehydration: Secondary | ICD-10-CM

## 2023-02-08 DIAGNOSIS — O219 Vomiting of pregnancy, unspecified: Secondary | ICD-10-CM | POA: Diagnosis not present

## 2023-02-08 DIAGNOSIS — Z3A01 Less than 8 weeks gestation of pregnancy: Secondary | ICD-10-CM

## 2023-02-08 LAB — COMPREHENSIVE METABOLIC PANEL
ALT: 10 U/L (ref 0–44)
AST: 14 U/L — ABNORMAL LOW (ref 15–41)
Albumin: 3.6 g/dL (ref 3.5–5.0)
Alkaline Phosphatase: 39 U/L (ref 38–126)
Anion gap: 10 (ref 5–15)
BUN: 12 mg/dL (ref 6–20)
CO2: 21 mmol/L — ABNORMAL LOW (ref 22–32)
Calcium: 8.5 mg/dL — ABNORMAL LOW (ref 8.9–10.3)
Chloride: 101 mmol/L (ref 98–111)
Creatinine, Ser: 0.57 mg/dL (ref 0.44–1.00)
GFR, Estimated: >60 mL/min (ref >60)
Glucose, Bld: 88 mg/dL (ref 70–99)
Potassium: 3.1 mmol/L — ABNORMAL LOW (ref 3.5–5.1)
Sodium: 132 mmol/L — ABNORMAL LOW (ref 135–145)
Total Bilirubin: 0.5 mg/dL (ref 0.3–1.2)
Total Protein: 7 g/dL (ref 6.5–8.1)

## 2023-02-08 MED ORDER — ONDANSETRON 4 MG PO TBDP: 4.0000 mg | ORAL_TABLET | Freq: Four times a day (QID) | ORAL | 0 refills | Status: DC | PRN

## 2023-02-08 MED ORDER — LACTATED RINGERS IV SOLN: Freq: Once | INTRAVENOUS | Status: AC

## 2023-02-08 MED ORDER — VITAMIN B-6 100 MG PO TABS
100.0000 mg | ORAL_TABLET | Freq: Once | ORAL | Status: AC
  Administered 2023-02-08: 100 mg via ORAL
  Filled 2023-02-08 (×2): qty 1

## 2023-02-08 MED ORDER — FAMOTIDINE IN NACL 20-0.9 MG/50ML-% IV SOLN
20.0000 mg | Freq: Once | INTRAVENOUS | Status: AC
  Administered 2023-02-08: 20 mg via INTRAVENOUS
  Filled 2023-02-08: qty 50

## 2023-02-08 MED ORDER — ONDANSETRON HCL 4 MG/2ML IJ SOLN
4.0000 mg | Freq: Once | INTRAMUSCULAR | Status: AC
  Administered 2023-02-08: 4 mg via INTRAVENOUS
  Filled 2023-02-08: qty 2

## 2023-02-08 MED ORDER — TRANSDERM-SCOP 1 MG/3DAYS TD PT72: 1.0000 | MEDICATED_PATCH | TRANSDERMAL | 12 refills | Status: DC

## 2023-02-08 MED ORDER — SCOPOLAMINE 1 MG/3DAYS TD PT72
1.0000 | MEDICATED_PATCH | Freq: Once | TRANSDERMAL | Status: DC
  Administered 2023-02-08: 1.5 mg via TRANSDERMAL
  Filled 2023-02-08: qty 1

## 2023-02-08 MED ORDER — PYRIDOXINE HCL 100 MG PO TABS: 100.0000 mg | ORAL_TABLET | Freq: Every day | ORAL | 0 refills | Status: DC

## 2023-02-08 NOTE — Discharge Instructions (Signed)
[facility] Prov[facility]   [facility]             [address], [place], [place] [address] [phone]  [facility]                                                             [address], [address], [place], [place], [address] [phone]  [facility]                                    [address], [address], [place], [place], [address] [phone]  [facility] [place] [address], [place], [place], [address] [phone]  [facility]                                 [address], [place], [place], [address] [phone]  [facility]                                    [address], [place], [place], [address] [phone]  [facility] [address], [address], [place], [place], [address]                              [facility] [address], [address], [place], , [address] [phone]  [facility]         Phone: [phone]  [facility]      Phone: [phone]   [facility]      Phone: [phone]  [facility]         Phone: [phone]   [facility]    Phone: [phone]  [facility]      Phone: [phone]  [facility]      Phone: [phone]  [facility]        Phone: [phone]  [facility] (Sheronette Cousins) [phone]  [facility]      Phone: [phone]   

## 2023-02-08 NOTE — MAU Provider Note (Signed)
Chief Complaint: Emesis During Pregnancy   None       SUBJECTIVE HPI: Erin Wilkins is a 27 y.o. G5P1021 at [redacted]w[redacted]d by LMP who presents to maternity admissions reporting nausea and vomiting.  Has been able to keep down Pepsi but nothing else.  . She denies vaginal bleeding, vaginal itching/burning, urinary symptoms, h/a, dizziness,or fever/chills.     Emesis  This is a recurrent problem. The problem has been unchanged. Pertinent negatives include no abdominal pain, chills, dizziness or fever. She has tried nothing for the symptoms.   RN Note: Erin Wilkins is a 27 y.o. at [redacted]w[redacted]d here in MAU reporting not eating anything in a week. When I try to eat nothing stays down. The only thing I tolerate is Pepsi. She feels like she has a nasty taste in her mouth which makes everything taste awful. No vag bleeding. No nausea meds. NO VB or other concerns   Past Medical History:  Diagnosis Date   Anemia    Irregular uterine bleeding 04/24/2018   UTI (urinary tract infection)    Vaginal candidiasis 07/08/2019   Past Surgical History:  Procedure Laterality Date   NO PAST SURGERIES     Social History   Socioeconomic History   Marital status: Single    Spouse name: Boyfriend - Christian   Number of children: 0   Years of education: 12   Highest education level: Not on file  Occupational History   Occupation: Research officer, political party: [employer]  Tobacco Use   Smoking status: Never    Passive exposure: Never   Smokeless tobacco: Never  Vaping Use   Vaping status: Never Used  Substance and Sexual Activity   Alcohol use: No   Drug use: No   Sexual activity: Yes  Other Topics Concern   Not on file  Social History Narrative   Erin Wilkins lives with her mother and older sister in an apartment. She plans to return to living there following delivery until she is financially stable. FOB and boyfriend Ephriam Wilkins is involved and supportive. Erin Wilkins graduated from highschool spring 2016  and was working at [employer] prior to pregnancy. Plans to return to work following delivery, though at a different establishment.   Social Determinants of Health   Financial Resource Strain: Not on file  Food Insecurity: Not on file  Transportation Needs: Not on file  Physical Activity: Not on file  Stress: Not on file  Social Connections: Not on file  Intimate Partner Violence: Not on file   No current facility-administered medications on file prior to encounter.   Current Outpatient Medications on File Prior to Encounter  Medication Sig Dispense Refill   hydrocortisone 2.5 % ointment Apply topically 2 (two) times daily. Until resolution. 30 g 0   omeprazole (PRILOSEC) 20 MG capsule Take 1 capsule (20 mg total) by mouth daily. 30 capsule 3   Allergies  Allergen Reactions   Metronidazole Nausea And Vomiting    To PO form (while pregnant)   Peanut (Diagnostic) Hives   Tylenol [Acetaminophen] Hives   Amoxicillin Hives and Rash    Rxn about 1 yr ago   Ancef [Cefazolin] Rash    Perioral rash   Ibuprofen Rash   Penicillins Hives and Rash    Has patient had a PCN reaction causing immediate rash, facial/tongue/throat swelling, SOB or lightheadedness with hypotension: No Has patient had a PCN reaction causing severe rash involving mucus membranes or skin necrosis: No Has patient had a  PCN reaction that required hospitalization: No Has patient had a PCN reaction occurring within the last 10 years: Yes If all of the above answers are "NO", then may proceed with Cephalosporin use.     I have reviewed patient's Past Medical Hx, Surgical Hx, Family Hx, Social Hx, medications and allergies.   ROS:  Review of Systems  Constitutional:  Negative for chills and fever.  Gastrointestinal:  Positive for vomiting. Negative for abdominal pain.  Neurological:  Negative for dizziness.   Review of Systems  Other systems negative   Physical Exam  Physical Exam Patient Vitals for the past 24  hrs:  BP Temp Pulse Resp SpO2 Height Weight  02/07/23 2112 113/71 -- -- -- -- -- --  02/07/23 2109 -- 98.3 F (36.8 C) 82 17 100 % 5' (1.524 m) 47.2 kg   Constitutional: Well-developed, well-nourished female in no acute distress.  Cardiovascular: normal rate Respiratory: normal effort GI: Abd soft, non-tender.  MS: Extremities nontender, no edema, normal ROM Neurologic: Alert and oriented x 4.  GU: Neg CVAT.  LAB RESULTS Results for orders placed or performed during the hospital encounter of 02/07/23 (from the past 24 hour(s))  Urinalysis, Routine w reflex microscopic -Urine, Clean Catch     Status: Abnormal   Collection Time: 02/07/23  9:25 PM  Result Value Ref Range   Color, Urine YELLOW YELLOW   APPearance CLEAR CLEAR   Specific Gravity, Urine 1.027 1.005 - 1.030   pH 6.0 5.0 - 8.0   Glucose, UA NEGATIVE NEGATIVE mg/dL   Hgb urine dipstick NEGATIVE NEGATIVE   Bilirubin Urine NEGATIVE NEGATIVE   Ketones, ur 80 (A) NEGATIVE mg/dL   Protein, ur 30 (A) NEGATIVE mg/dL   Nitrite NEGATIVE NEGATIVE   Leukocytes,Ua SMALL (A) NEGATIVE   RBC / HPF 0-5 0 - 5 RBC/hpf   WBC, UA 0-5 0 - 5 WBC/hpf   Bacteria, UA RARE (A) NONE SEEN   Squamous Epithelial / HPF 0-5 0 - 5 /HPF   Mucus PRESENT     --/--/O POS (09/11 0930)  IMAGING   MAU Management/MDM: I have reviewed the triage vital signs and the nursing notes.   Pertinent labs & imaging results that were available during my care of the patient were reviewed by me and considered in my medical decision making (see chart for details).      I have reviewed her medical records including past results, notes and treatments. Medical, Surgical, and family history were reviewed.  Medications and recent lab tests were reviewed  Ordered UA and CMET.   Mild hypokalemia noted with UA showing mild dehydration.     Treatments in MAU included IV hydration, Pepcid, Vitamin B6, Scopolamine and zofran.  These did provide her with relief.  She was able  to tolerate PO intake afterward.    ASSESSMENT Single IUP at [redacted]w[redacted]d Nausea and vomiting Mild dehyration  PLAN Discharge home Rx Zofran for nausea Rx Vitamin B6 Rx Scopolamine patch for nausea Advance diet as tolerated.  PO intake should replete potassium  Pt stable at time of discharge. Encouraged to return here if she develops worsening of symptoms, increase in pain, fever, or other concerning symptoms.    Wynelle Bourgeois CNM, MSN Certified Nurse-Midwife 02/08/2023  12:28 AM

## 2023-02-08 NOTE — Progress Notes (Signed)
Written and verbal d/c instructions given and understanding voiced. 

## 2023-02-11 ENCOUNTER — Encounter: Payer: Medicaid Other | Admitting: Student

## 2023-02-11 NOTE — Progress Notes (Deleted)
Patient Name: Erin Wilkins Date of Birth: 12-15-1995 [facility] Initial Prenatal Visit  Erin Wilkins is a 27 y.o. year old G5P1021 at [redacted]w[redacted]d who presents for her initial prenatal visit. Pregnancy {Is/is not:9024} planned She reports {pregnancy symptoms:18128}. She {is/is not:320031::"is"} taking a prenatal vitamin.  She denies pelvic pain or vaginal bleeding.   Pregnancy Dating: The patient is dated by LMP c/w early [place] at [redacted]w[redacted]d by CRL.  LMP: *** Period is certain:  {yes/no:20286}.  Periods were regular:  {yes/no:20286}.  LMP was a typical period:  {yes/no:20286}.  Using hormonal contraception in 3 months prior to conception: {yes/no:20286}  Lab Review: Blood type: O POS Rh Status: {Rh status :23298::"+"} Antibody screen: {NEGATIVE/POSITIVE FOR:19998::"Negative"} HIV: {NEGATIVE/POSITIVE FOR:19998::"Negative"} RPR: {NEGATIVE/APPRO/POSITIVE FOR:20006::"Negative"} Hemoglobin electrophoresis reviewed: {yes/no:20286::"Yes"} Results of OB urine culture are: {NEGATIVE/POSITIVE FOR:19998::"Negative"} Rubella: {Desc; immune/not/unknown:31571::"Immune"} Hep C Ab: {NEGATIVE/POSITIVE FOR:19998::"Negative"} Varicella status is {Desc; immune/not/unknown:31571::"Immune"}  PMH: Reviewed and as detailed below: HTN: {yes/no:20286::"No"}  Gestational Hypertension/preeclampsia: {yes/no:20286::"No"}  Type 1 or 2 Diabetes: {yes/no:20286::"No"}  Depression:  {yes/no:20286::"No"}  Seizure disorder:  {yes/no:20286::"No"} VTE: {yes/no:20286::"No"} ,  History of STI {yes/no:20286::"No"},  Abnormal Pap smear:  {yes/no:20286::"No"}, Genital herpes simplex:  {yes/no:20286::"No"}   PSH: Gynecologic Surgery:  {No/  **:31982:o:"no"} Surgical history reviewed, notable for: ***  Obstetric History: Obstetric history tab updated and reviewed.  Summary of prior pregnancies: *** Cesarean delivery: {yes/no:20286::"No"}  Gestational Diabetes:  {yes/no:20286::"No"} Hypertension in  pregnancy: {yes/no:20286::"No"} History of preterm birth: {yes/no:20286::"No"} History of LGA/SGA infant:  {yes/no:20286::"No"} History of shoulder dystocia: {yes/no:20286::"No"} Indications for referral were reviewed, and the patient has no obstetric indications for referral to High Risk [facility] Clinic at this time.   Social History: Partner's name: ***  Tobacco use: {yes/no:20286::"No"} Alcohol use:  {yes/no:20286::"No"} Other substance use:  {yes/no:20286::"No"}  Current Medications:  ***  Reviewed and appropriate in pregnancy.   Genetic and Infection Screen: Flow Sheet Updated {yes/no:20286::"Yes"}  Prenatal Exam: Gen: Well nourished, well developed.  No distress.  Vitals noted. HEENT: Normocephalic, atraumatic.  Neck supple without cervical lymphadenopathy, thyromegaly or thyroid nodules.  Fair dentition. CV: RRR no murmur, gallops or rubs Lungs: CTA B.  Normal respiratory effort without wheezes or rales. Abd: soft, NTND. +BS.  Uterus not appreciated above pelvis. GU: Normal external female genitalia without lesions.  Nl vaginal, well rugated without lesions. No vaginal discharge.  Bimanual exam: No adnexal mass or TTP. No CMT.  Uterus size *** Ext: No clubbing, cyanosis or edema. Psych: Normal grooming and dress.  Not depressed or anxious appearing.  Normal thought content and process without flight of ideas or looseness of associations  Fetal heart tones: {appropriate:23337::"Appropriate"}  Assessment/Plan:  Erin Wilkins is a 27 y.o. G5P1021 at [redacted]w[redacted]d who presents to initiate prenatal care. She is doing well.  Current pregnancy issues include ***.  Routine prenatal care: As dating {ACTION; IS/IS NOT:21021397::"is not"} reliable, a dating ultrasound {HAS HAS NOT:18834::"has"} been ordered. Dating tab updated. Pre-pregnancy weight updated. Expected weight gain this pregnancy is {weight gain pregnancy :23296::"25-35 pounds "} Prenatal labs reviewed, notable for  ***. Indications for referral to HR[facility] were reviewed and the patient {DOES NOT does:27190::"does not"} meet criteria for referral.  Medication list reviewed and updated.  Recommended patient see a dentist for regular care.  Bleeding and pain precautions reviewed. Importance of prenatal vitamins reviewed.  Genetic screening offered. Patient opted for: {obgeneticscreen:23414}. The patient has the following indications for aspirinto begin 81 mg at 12-16 weeks: One high risk condition: {fmcaspirinobhigh:26167} MORE than one moderate risk  condition: {fmcaspirinobmoderate:26168} Aspirin {WAS/WAS NOT:[phone]::"was not"}  recommended today based upon above risk factors (one high risk condition or more than one moderate risk factor)  The patient {will/will not be:23415} age 4 or over at time of delivery. Referral to genetic counseling {WAS/WAS NOT:[phone]::"was not"} offered today.  The patient has the following risk factors for preexisting diabetes: {Pre-existing diabetes screening:23343::"Reviewed indications for early 1 hour glucose testing, not indicated "}. An early 1 hour glucose tolerance test {WAS/WAS NOT:[phone]::"was not"} ordered. Pregnancy Medical Home and PHQ-9 forms completed, problems noted: {yes/no:20286}  2. Pregnancy issues include the following which were addressed today:  ***   Follow up 4 weeks for next prenatal visit.

## 2023-02-12 ENCOUNTER — Telehealth: Payer: Self-pay

## 2023-02-12 NOTE — Telephone Encounter (Signed)
Patient calls nurse line due to nausea and vomiting in first trimester.   She was seen in MAU on 02/07/23 for this concern. Prescribed Zofran and Scopolamine patches. Patient states that zofran has not been helping and that she has not been able to pick up patches.   Called pharmacy. They are currently out of stock, however, [facility] on [address] has medication in stock.   Called patient. Advised that she could transfer her prescription to this location.   Patient has new OB visit next week with Dr. Barbaraann Faster.   Advised of supportive measures and MAU precautions.   Veronda Prude, RN

## 2023-02-13 NOTE — Telephone Encounter (Signed)
Hey there!  We can try diclegis for the patient. It's Unisom and Vitamin B6 together. Let me know if you need me to place an order.

## 2023-02-15 ENCOUNTER — Other Ambulatory Visit: Payer: Self-pay | Admitting: Student

## 2023-02-15 ENCOUNTER — Telehealth: Payer: Self-pay | Admitting: Student

## 2023-02-15 ENCOUNTER — Encounter: Payer: Self-pay | Admitting: Student

## 2023-02-15 MED ORDER — DOXYLAMINE-PYRIDOXINE 10-10 MG PO TBEC: DELAYED_RELEASE_TABLET | ORAL | 1 refills | Status: DC

## 2023-02-15 NOTE — Telephone Encounter (Signed)
Called to discuss patient having nausea and was requesting meds. Sent in Diclegis for patient to take. Gave her dosing instructions and sent message on mychart  Day 1: Take two tablets at bedtime. Day 2: If symptoms persist into the afternoon, continue with two tablets at bedtime. Day 3: If symptoms are still not controlled, take one tablet in the morning and two tablets at bedtime. Day 4: If symptoms persist, take one tablet in the morning, one tablet in the mid-afternoon, and two tablets at bedtime. Max 4 pills in 24 hours

## 2023-02-15 NOTE — Progress Notes (Signed)
Patient called and given instructions for taking medicine.   Day 1: Take two tablets at bedtime. Day 2: If symptoms persist into the afternoon, continue with two tablets at bedtime. Day 3: If symptoms are still not controlled, take one tablet in the morning and two tablets at bedtime. Day 4: If symptoms persist, take one tablet in the morning, one tablet in the mid-afternoon, and two tablets at bedtime. Max 4 pills in 24 hours

## 2023-02-18 ENCOUNTER — Encounter: Payer: Medicaid Other | Admitting: Student

## 2023-02-18 NOTE — Progress Notes (Deleted)
Patient Name: Erin Wilkins Date of Birth: August 31, 1995 [facility] Initial Prenatal Visit  Erin Wilkins is a 27 y.o. year old G5P1021 at [redacted]w[redacted]d who presents for her initial prenatal visit. Pregnancy {Is/is not:9024} planned She reports {pregnancy symptoms:18128}. She {is/is not:320031::"is"} taking a prenatal vitamin.  She denies pelvic pain or vaginal bleeding.   Pregnancy Dating: The patient is dated by ***.  LMP: *** Period is certain:  {yes/no:20286}.  Periods were regular:  {yes/no:20286}.  LMP was a typical period:  {yes/no:20286}.  Using hormonal contraception in 3 months prior to conception: {yes/no:20286}  Lab Review: Blood type: O POS Rh Status: {Rh status :23298::"+"} Antibody screen: {NEGATIVE/POSITIVE FOR:19998::"Negative"} HIV: {NEGATIVE/POSITIVE FOR:19998::"Negative"} RPR: {NEGATIVE/APPRO/POSITIVE FOR:20006::"Negative"} Hemoglobin electrophoresis reviewed: {yes/no:20286::"Yes"} Results of OB urine culture are: {NEGATIVE/POSITIVE FOR:19998::"Negative"} Rubella: {Desc; immune/not/unknown:31571::"Immune"} Hep C Ab: {NEGATIVE/POSITIVE FOR:19998::"Negative"} Varicella status is {Desc; immune/not/unknown:31571::"Immune"}  PMH: Reviewed and as detailed below: HTN: {yes/no:20286::"No"}  Gestational Hypertension/preeclampsia: {yes/no:20286::"No"}  Type 1 or 2 Diabetes: {yes/no:20286::"No"}  Depression:  {yes/no:20286::"No"}  Seizure disorder:  {yes/no:20286::"No"} VTE: {yes/no:20286::"No"} ,  History of STI {yes/no:20286::"No"},  Abnormal Pap smear:  {yes/no:20286::"No"}, Genital herpes simplex:  {yes/no:20286::"No"}   PSH: Gynecologic Surgery:  {No/  **:31982:o:"no"} Surgical history reviewed, notable for: ***  Obstetric History: Obstetric history tab updated and reviewed.  Summary of prior pregnancies: *** Cesarean delivery: {yes/no:20286::"No"}  Gestational Diabetes:  {yes/no:20286::"No"} Hypertension in pregnancy:  {yes/no:20286::"No"} History of preterm birth: {yes/no:20286::"No"} History of LGA/SGA infant:  {yes/no:20286::"No"} History of shoulder dystocia: {yes/no:20286::"No"} Indications for referral were reviewed, and the patient has no obstetric indications for referral to High Risk OB Clinic at this time.   Social History: Partner's name: ***  Tobacco use: {yes/no:20286::"No"} Alcohol use:  {yes/no:20286::"No"} Other substance use:  {yes/no:20286::"No"}  Current Medications:  ***  Reviewed and appropriate in pregnancy.   Genetic and Infection Screen: Flow Sheet Updated {yes/no:20286::"Yes"}  Prenatal Exam: Gen: Well nourished, well developed.  No distress.  Vitals noted. HEENT: Normocephalic, atraumatic.  Neck supple without cervical lymphadenopathy, thyromegaly or thyroid nodules.  Fair dentition. CV: RRR no murmur, gallops or rubs Lungs: CTA B.  Normal respiratory effort without wheezes or rales. Abd: soft, NTND. +BS.  Uterus not appreciated above pelvis. GU: Normal external female genitalia without lesions.  Nl vaginal, well rugated without lesions. No vaginal discharge.  Bimanual exam: No adnexal mass or TTP. No CMT.  Uterus size *** Ext: No clubbing, cyanosis or edema. Psych: Normal grooming and dress.  Not depressed or anxious appearing.  Normal thought content and process without flight of ideas or looseness of associations  Fetal heart tones: {appropriate:23337::"Appropriate"}  Assessment/Plan:  Lynnelle Mesmer is a 27 y.o. G5P1021 at [redacted]w[redacted]d who presents to initiate prenatal care. She is doing well.  Current pregnancy issues include ***.  Routine prenatal care: As dating {ACTION; IS/IS NOT:21021397::"is not"} reliable, a dating ultrasound {HAS HAS NOT:18834::"has"} been ordered. Dating tab updated. Pre-pregnancy weight updated. Expected weight gain this pregnancy is {weight gain pregnancy :23296::"25-35 pounds "} Prenatal labs reviewed, notable for ***. Indications for  referral to HROB were reviewed and the patient {DOES NOT does:27190::"does not"} meet criteria for referral.  Medication list reviewed and updated.  Recommended patient see a dentist for regular care.  Bleeding and pain precautions reviewed. Importance of prenatal vitamins reviewed.  Genetic screening offered. Patient opted for: {obgeneticscreen:23414}. The patient has the following indications for aspirinto begin 81 mg at 12-16 weeks: One high risk condition: {fmcaspirinobhigh:26167} MORE than one moderate risk condition: {fmcaspirinobmoderate:26168} Aspirin {WAS/WAS [phone]::"was not"}  recommended today based upon above risk factors (one high risk condition or more than one moderate risk factor)  The patient {will/will not be:23415} age 44 or over at time of delivery. Referral to genetic counseling {WAS/WAS [phone]::"was not"} offered today.  The patient has the following risk factors for preexisting diabetes: {Pre-existing diabetes screening:23343::"Reviewed indications for early 1 hour glucose testing, not indicated "}. An early 1 hour glucose tolerance test {WAS/WAS [phone]::"was not"} ordered. Pregnancy Medical Home and PHQ-9 forms completed, problems noted: {yes/no:20286}  2. Pregnancy issues include the following which were addressed today:  ***   Follow up 4 weeks for next prenatal visit.

## 2023-02-19 ENCOUNTER — Other Ambulatory Visit (HOSPITAL_COMMUNITY)
Admission: RE | Admit: 2023-02-19 | Discharge: 2023-02-19 | Disposition: A | Discharge status: Home / Self Care | Payer: Medicaid Other | Source: Ambulatory Visit | Attending: Family Medicine | Admitting: Family Medicine

## 2023-02-19 ENCOUNTER — Ambulatory Visit: Payer: Medicaid Other | Admitting: Student

## 2023-02-19 VITALS — BP 98/58 | HR 68 | Wt 99.0 lb

## 2023-02-19 DIAGNOSIS — Z3401 Encounter for supervision of normal first pregnancy, first trimester: Secondary | ICD-10-CM | POA: Diagnosis not present

## 2023-02-19 DIAGNOSIS — Z3A09 9 weeks gestation of pregnancy: Secondary | ICD-10-CM

## 2023-02-19 DIAGNOSIS — Z3491 Encounter for supervision of normal pregnancy, unspecified, first trimester: Secondary | ICD-10-CM | POA: Diagnosis not present

## 2023-02-19 NOTE — Patient Instructions (Signed)
It was great to see you today! Thank you for choosing [facility] for your obstetric care. Erin Wilkins was seen for initial OB visit.   Nausea/Vomiting Take the diclegis as recommended  First two days take 2 pills before bed  Day 3 take one pill in am and 2 pills before bed  Day 4 Take one pill in am, one pill at lunch, 2 pills before bed  *Max 4 pills a day  MOMS TO BE  Activity: Moms-to-be must be very diligent about getting enough exercise, as staying active will not only help you keep your weight gains to a healthy level, but can also help you manage your symptoms, and strengthen your body for labor.  Nutrition: Even though prenatal vitamins contain lots of vitamins and nutrients you need, it's important to reinforce this nutritional need through healthy eating. Pregnant women should do their best to eat plenty of fresh fruits, veggies, and meats, and avoid eating overly fatty or processed foods, as the body works best with only natural fuels.  Sleep: Getting the right amount of ZZZs is imperative during pregnancy, as it allows your body to recover to the strength it needs to help baby develop healthily, and can help you manage your toughest symptoms.  Blood pressure: Abnormal blood pressure, whether high or low, can both be dangerous for you and Baby during pregnancy, so it's very important to monitor your BP readings so you'll know that all is well.   Genetic Screening Options For you  NIPT stands for noninvasive prenatal testing. It's a screening test offered during pregnancy to see if the fetus is at risk for having a chromosomal disorder like Down syndrome (trisomy 71), trisomy 85 (Edwards syndrome) and trisomy 47 (Patau syndrome). The test can also determine the sex of the fetus.   Go to the MAU at [facility] if: You have pain in your lower abdomen or pelvic area Your water breaks.  Sometimes it is a big gush of fluid, sometimes it is  just a trickle that keeps getting your underwear wet or running down your legs You have vaginal bleeding.    ORANGE CARD The "[employer]" guarantees healthcare access to people who meet eligibility requirements for services within [facility]. Your orange card gives you access to everything that the [facility] has to offer. When qualified, patients are assigned to a medical home to receive care management which includes referrals to specialty care.  Your orange card gives you access to everything that the [facility] has to offer. All you need to do to take part in your health care is to give [facility] a call at [phone] or visit their website [email or web address]  ELIGIBILITY REQUIREMENTS If you answer Yes to ALL 4 below, then speak with a [facility] eligibility and enrollment person (at the [facility] and [employer] of Social Services) who will explain the program and determine if you qualify  1. You do not have a regular primary care doctor (Medical Home). 2. You are not eligible for state or federally sponsored health insurance including the [employer] Market[employer] (Exemption Required), Medicare, Medicaid (except a Forensic scientist) or Physicist, medical. 3. You live in [facility] (only). (min. 3 months, 6 months preferred) 4. Your annual income is between 0-200% of the federal poverty level  FINANCIAL ASSISTANCE Arts development officer Program (FAP ) The FAP program is for [facility] and [place], [employer]  and [place] counties of [place] residents who are uninsured patients and have received hospital services with account balance(s) greater than or equal to $10,000. Qualified uninsured patients who apply to participate in the FAP will be reviewedby the [facility] Counseling Department. A revenue cycle representative will review the patient FAP application. If a patient fully  cooperates with this process and no coverage is available, the account will be evaluated for financial assistance based on their income as compared to federal poverty guidelines (FPG). Patients with income less than or equal to 200% of FPG will receive a 100% discount. Patients between 201% and 400% of the FPG will qualify for partial discounts. Payment options are available to assist patients in paying their remaining balance.  Advertising account executive Program (FAS) The Advertising account executive program is for [place], [place], [place], [place] and [place] counties of [place] residents who are uninsured patients and have received hospital services that resulted in a balance less than $10,000. Each account will be automatically reviewed for a financial assistance discount prior to billing. Eligibility is based on a financial assistance score from a third party vendor that indicates the likelihood a patient lives in poverty. Patients with qualifying accounts will be extended an adjustment to their account as part of the financial assistance program. Patients will be notified in writing of their eligibility status in the financial assistance program.  Patients can apply for the Financial Assistance Program by downloading an application at [email or web address], requesting by mail, [facility] Patient Accounting, [address] [place], [place]. [address]), contacting customer service [phone]), or in person at any [facility] facility.  A copy of the [facility] Coverage Assistance and Financial Assistance policy is available upon request electronically and/or by mail.  In addition to the financial assistance programs, two discount programs are available to [facility] patients:  Uninsured Discount Uninsured patients will receive a discount off gross charges on all medically necessary services. This is applied automatically and no action is needed by the patient  to receive this discount. The uninsured discount is available to all uninsured patients. Calculations are based on the amount generally billed (AGB) to [employer] and [employer].  Hardship Settlement This program is designed to assist [address], [place], [place], [place] and [place] of [place] residents who have had a catastrophic medical event regardless of their insurance coverage that has resulted in very large hospital bills in comparison to their financial resources. Patients who have incurred a balance of greater than $5,000 after all insurance or third party payments and the remaining balance is greater than 20% of their total household financial resources may be eligible for a [employer]. Patients seeking a hardship settlement discount should inquire about this program by calling the customer service department [phone]) after receiving their first statement.  Contact information: [facility] Patient Accounting [address] Patient Customer Service [place], [place]. [address] [phone] Attn: Customer Service  If you haven't already, sign up for My Chart to have easy access to your labs results, and communication with your primary care physician.  We are checking some labs today. If they are abnormal, I will call you. If they are normal, I will send you a MyChart message (if it is active) or a letter in the mail. If you do not hear about your labs in the next 2 weeks, please call the office.   You should return to our clinic No follow-ups on file.  I recommend that you always bring your medications to each appointment as this makes it easy to ensure you are on the [place] [phone] si sus sntomas empeoran o si tiene alguna inquietud.

## 2023-02-19 NOTE — Progress Notes (Signed)
Patient Name: Erin Wilkins Date of Birth: 1995/06/16 [facility] Initial Prenatal Visit  Erin Wilkins is a 27 y.o. year old G5P1021 at [redacted]w[redacted]d who presents for her initial prenatal visit. Pregnancy is not planned She reports morning sickness and nausea. She is not taking a prenatal vitamin.  She denies pelvic pain or vaginal bleeding.   Pregnancy Dating: The patient is dated by LMP.  LMP: 12/16/22 Period is certain:  Yes.  Periods were regular:  Yes.  LMP was a typical period:  Yes.  Using hormonal contraception in 3 months prior to conception: No  Lab Review: Blood type: O POS Rh Status: pending Antibody screen: Pending HIV: Pending RPR: Pending Hemoglobin electrophoresis reviewed: No Results of OB urine culture are: Pending Rubella: Unknown Hep C Ab: Pending Varicella status is Pending  PMH: Reviewed and as detailed below: HTN: No  Gestational Hypertension/preeclampsia: No  Type 1 or 2 Diabetes: No  Depression:  No  Seizure disorder:  No VTE: No ,  History of STI Yes, Trich and Chlamydia Abnormal Pap smear:  No, Genital herpes simplex:  No   PSH: Gynecologic Surgery:  no Surgical history reviewed, notable for: Arm surgery  Obstetric History: Obstetric history tab updated and reviewed.  Summary of prior pregnancies: 3 prior pregnancies, 1 prior deliver, 2 abortions Cesarean delivery: No  Gestational Diabetes:  No Hypertension in pregnancy: No History of preterm birth: No History of LGA/SGA infant:  No History of shoulder dystocia: No Indications for referral were reviewed, and the patient has no obstetric indications for referral to High Risk OB Clinic at this time.   Social History: Partner's name: Deon, doesn't know about the baby.   Tobacco use: No Alcohol use:  No Other substance use:  No  Current Medications:  None  Reviewed and appropriate in pregnancy.   Genetic and Infection Screen: Flow Sheet Updated No, update at next  visit  Prenatal Exam: Gen: Well nourished, well developed.  No distress.  Vitals noted. HEENT: Normocephalic, atraumatic.  CV: RRR no murmur, gallops or rubs Lungs: CTAB.  Normal respiratory effort without wheezes or rales. Abd: soft, NTND. +BS.  Uterus not appreciated above pelvis. GU: Normal external female genitalia without lesions.  Nl vaginal, well rugated without lesions. No vaginal discharge.  Bimanual exam: No adnexal mass or TTP. No CMT.  Ext: no edema Psych: Normal grooming and dress.  Not depressed or anxious appearing.  Normal thought content and process without flight of ideas or looseness of associations  Fetal heart tones: Appropriate  Assessment/Plan:  Toria Flygare is a 27 y.o. G5P1021 at [redacted]w[redacted]d who presents to initiate prenatal care. She is doing well.  Current pregnancy issues include nausea and vomiting.  Routine prenatal care: As dating is reliable, a dating ultrasound has not been ordered. Dating tab updated. Pre-pregnancy weight updated. Expected weight gain this pregnancy is 25-35 pounds  Prenatal labs reviewed, notable for hypokalemia. Indications for referral to HROB were reviewed and the patient does not meet criteria for referral.  Medication list reviewed and updated.  Recommended patient see a dentist for regular care.  Bleeding and pain precautions reviewed. Importance of prenatal vitamins reviewed.  Genetic screening offered. Patient opted for: no screening. The patient has the following indications for aspirinto begin 81 mg at 12-16 weeks: One high risk condition: no single high risk condition  MORE than one moderate risk condition: low SES   and identifies as African American  Aspirin was not  recommended today  based upon above risk factors (one high risk condition or more than one moderate risk factor)  The patient will not be age 67 or over at time of delivery. Referral to genetic counseling was not offered today.  The patient has the following  risk factors for preexisting diabetes: Reviewed indications for early 1 hour glucose testing, not indicated . An early 1 hour glucose tolerance test was not ordered. Pregnancy Medical Home and PHQ-9 forms completed, problems noted: No  2. Pregnancy issues include the following which were addressed today:  Prenatal vitamin started today, patient previously not on one Nausea and Vomiting Will start diclegis, reviewed dosing regimen   Follow up 4 weeks for next prenatal visit.

## 2023-02-20 LAB — CERVICOVAGINAL ANCILLARY ONLY
Chlamydia: NEGATIVE
Comment: NEGATIVE
Comment: NEGATIVE
Comment: NORMAL
Neisseria Gonorrhea: NEGATIVE
Trichomonas: NEGATIVE

## 2023-02-20 MED ORDER — PRENATAL VITAMINS 27-0.8 MG PO TABS: 1.0000 | ORAL_TABLET | Freq: Every day | ORAL | 9 refills | Status: DC

## 2023-02-21 LAB — CYTOLOGY - PAP
Comment: NEGATIVE
Diagnosis: NEGATIVE
High risk HPV: NEGATIVE

## 2023-02-22 ENCOUNTER — Other Ambulatory Visit: Payer: Self-pay | Admitting: Family Medicine

## 2023-02-22 ENCOUNTER — Telehealth: Payer: Self-pay | Admitting: Family Medicine

## 2023-02-22 DIAGNOSIS — E876 Hypokalemia: Secondary | ICD-10-CM

## 2023-02-22 NOTE — Telephone Encounter (Signed)
Attempted to call patient x2 as bloodwork was not drawn from appt on 02/19/23.   Number disconnected and unable to leave VM.  IF she calls back, please make lab appt to get new OB blood work.  Burley Saver MD

## 2023-03-08 ENCOUNTER — Inpatient Hospital Stay (HOSPITAL_COMMUNITY)
Admission: AD | Admit: 2023-03-08 | Discharge: 2023-03-08 | Disposition: A | Discharge status: Home / Self Care | Payer: Medicaid Other | Attending: Family Medicine | Admitting: Family Medicine

## 2023-03-08 ENCOUNTER — Other Ambulatory Visit (HOSPITAL_COMMUNITY): Payer: Self-pay

## 2023-03-08 ENCOUNTER — Encounter (HOSPITAL_COMMUNITY): Payer: Self-pay | Admitting: Family Medicine

## 2023-03-08 DIAGNOSIS — O99611 Diseases of the digestive system complicating pregnancy, first trimester: Secondary | ICD-10-CM | POA: Insufficient documentation

## 2023-03-08 DIAGNOSIS — Z3A11 11 weeks gestation of pregnancy: Secondary | ICD-10-CM | POA: Insufficient documentation

## 2023-03-08 DIAGNOSIS — O26891 Other specified pregnancy related conditions, first trimester: Secondary | ICD-10-CM | POA: Diagnosis not present

## 2023-03-08 DIAGNOSIS — K117 Disturbances of salivary secretion: Secondary | ICD-10-CM | POA: Diagnosis not present

## 2023-03-08 DIAGNOSIS — O219 Vomiting of pregnancy, unspecified: Secondary | ICD-10-CM | POA: Diagnosis not present

## 2023-03-08 DIAGNOSIS — R0602 Shortness of breath: Secondary | ICD-10-CM | POA: Insufficient documentation

## 2023-03-08 LAB — CBC
HCT: 31.9 % — ABNORMAL LOW (ref 36.0–46.0)
Hemoglobin: 11.2 g/dL — ABNORMAL LOW (ref 12.0–15.0)
MCH: 31.1 pg (ref 26.0–34.0)
MCHC: 35.1 g/dL (ref 30.0–36.0)
MCV: 88.6 fL (ref 80.0–100.0)
Platelets: 352 10*3/uL (ref 150–400)
RBC: 3.6 MIL/uL — ABNORMAL LOW (ref 3.87–5.11)
RDW: 12.4 % (ref 11.5–15.5)
WBC: 8.8 10*3/uL (ref 4.0–10.5)
nRBC: 0 % (ref 0.0–0.2)

## 2023-03-08 LAB — URINALYSIS, ROUTINE W REFLEX MICROSCOPIC
Bilirubin Urine: NEGATIVE
Glucose, UA: NEGATIVE mg/dL
Hgb urine dipstick: NEGATIVE
Ketones, ur: 20 mg/dL — AB
Nitrite: NEGATIVE
Protein, ur: 30 mg/dL — AB
Specific Gravity, Urine: 1.026 (ref 1.005–1.030)
pH: 5 (ref 5.0–8.0)

## 2023-03-08 MED ORDER — GLYCOPYRROLATE 1 MG PO TABS
2.0000 mg | ORAL_TABLET | Freq: Once | ORAL | Status: AC
  Administered 2023-03-08: 2 mg via ORAL
  Filled 2023-03-08: qty 2

## 2023-03-08 MED ORDER — SCOPOLAMINE 1 MG/3DAYS TD PT72
1.0000 | MEDICATED_PATCH | Freq: Once | TRANSDERMAL | Status: DC
  Administered 2023-03-08: 1.5 mg via TRANSDERMAL
  Filled 2023-03-08: qty 1

## 2023-03-08 MED ORDER — GLYCOPYRROLATE 2 MG PO TABS
2.0000 mg | ORAL_TABLET | Freq: Three times a day (TID) | ORAL | 3 refills | Status: DC
  Filled 2023-03-08: qty 90, 30d supply, fill #0

## 2023-03-08 MED ORDER — TRANSDERM-SCOP 1 MG/3DAYS TD PT72
1.0000 | MEDICATED_PATCH | TRANSDERMAL | 12 refills | Status: DC
Start: 2023-03-08 — End: 2023-07-10
  Filled 2023-03-08: qty 6, 18d supply, fill #0

## 2023-03-08 NOTE — MAU Provider Note (Signed)
Faculty [facility] OB/GYN Attending MAU Note  Chief Complaint: Shortness of Breath and Dizziness    Event Date/Time   First Provider Initiated Contact with Patient 03/08/23 0943      SUBJECTIVE Erin Wilkins is a 27 y.o. G5P1031 at [redacted]w[redacted]d by LMP who presents with dizziness. Has N/V and ptyalism and continues to lose weight. Has zofran and phenergan and could not get scope patch as it was on back-order. Her saliva seems to be making her more sick. Reports SOB at times, though can be at rest, with exertion. Has no h/o lung disease. Does have h/o anemia.  Past Medical History:  Diagnosis Date   Anemia    Irregular uterine bleeding 04/24/2018   UTI (urinary tract infection)    Vaginal candidiasis 07/08/2019   OB History  Gravida Para Term Preterm AB Living  5 1 1   3 1   SAB IAB Ectopic Multiple Live Births  1 2   0 1    # Outcome Date GA Lbr Len/2nd Weight Sex Type Anes PTL Lv  5 Current           4 IAB 12/06/19          3 IAB 12/2019          2 Term 03/10/15 [redacted]w[redacted]d 18:39 / 02:00 3265 g M Vag-Spont EPI  LIV  1 SAB            Past Surgical History:  Procedure Laterality Date   NO PAST SURGERIES     Social History   Socioeconomic History   Marital status: Single    Spouse name: Boyfriend - Christian   Number of children: 0   Years of education: 12   Highest education level: Not on file  Occupational History   Occupation: Research officer, political party: [employer]  Tobacco Use   Smoking status: Never    Passive exposure: Never   Smokeless tobacco: Never  Vaping Use   Vaping status: Never Used  Substance and Sexual Activity   Alcohol use: No   Drug use: No   Sexual activity: Yes  Other Topics Concern   Not on file  Social History Narrative   Jennalee Greaves lives with her mother and older sister in an apartment. She plans to return to living there following delivery until she is financially stable. FOB and boyfriend Ephriam Knuckles is involved and supportive. Leola  graduated from highschool spring 2016 and was working at [employer] prior to pregnancy. Plans to return to work following delivery, though at a different establishment.   Social Determinants of Health   Financial Resource Strain: Not on file  Food Insecurity: Not on file  Transportation Needs: Not on file  Physical Activity: Not on file  Stress: Not on file  Social Connections: Not on file  Intimate Partner Violence: Not on file   No current facility-administered medications on file prior to encounter.   Current Outpatient Medications on File Prior to Encounter  Medication Sig Dispense Refill   Doxylamine-Pyridoxine (DICLEGIS) 10-10 MG TBEC 2 tabs before bed for 2 days, next day can add 1 tab to am, following day and can add 1 tab mid day. Max 4 tabs in 24 hours 60 tablet 1   hydrocortisone 2.5 % ointment Apply topically 2 (two) times daily. Until resolution. 30 g 0   Prenatal Vit-Fe Fumarate-FA (PRENATAL VITAMINS) 27-0.8 MG TABS Take 1 tablet by mouth daily. 30 each 9   omeprazole (PRILOSEC) 20 MG capsule Take 1  capsule (20 mg total) by mouth daily. 30 capsule 3   ondansetron (ZOFRAN-ODT) 4 MG disintegrating tablet Take 1 tablet (4 mg total) by mouth every 6 (six) hours as needed for nausea. 20 tablet 0   pyridOXINE (B-6) 100 MG tablet Take 1 tablet (100 mg total) by mouth daily. 1 tablet 0   Allergies  Allergen Reactions   Keflex [Cephalexin] Hives   Metronidazole Nausea And Vomiting    To PO form (while pregnant)   Peanut (Diagnostic) Hives   Tylenol [Acetaminophen] Hives   Amoxicillin Hives and Rash    Rxn about 1 yr ago   Ancef [Cefazolin] Rash    Perioral rash   Ibuprofen Rash   Penicillins Hives and Rash    Has patient had a PCN reaction causing immediate rash, facial/tongue/throat swelling, SOB or lightheadedness with hypotension: No Has patient had a PCN reaction causing severe rash involving mucus membranes or skin necrosis: No Has patient had a PCN reaction that  required hospitalization: No Has patient had a PCN reaction occurring within the last 10 years: Yes If all of the above answers are "NO", then may proceed with Cephalosporin use.     ROS: Pertinent items in HPI  OBJECTIVE BP 116/71   Pulse (!) 101   Temp 98.6 F (37 C)   Resp 18   LMP 12/16/2022 (Exact Date)   SpO2 97%  CONSTITUTIONAL: Well-developed, well-nourished female in no acute distress.  HENT:  Normocephalic, atraumatic, External right and left ear normal. Oropharynx is clear and moist EYES: Conjunctivae and EOM are normal.  No scleral icterus.  NECK: Normal range of motion, supple, no masses.  Normal thyroid.  SKIN: Skin is warm and dry. No rash noted. Not diaphoretic. No erythema. No pallor. NEUROLGIC: Alert and oriented to person, place, and time. CARDIOVASCULAR: Normal heart rate noted RESPIRATORY: Effort and breath sounds normal, no problems with respiration noted. ABDOMEN: Soft, normal bowel sounds, no distention noted.  No tenderness, rebound or guarding.  MUSCULOSKELETAL: Normal range of motion. No tenderness.  No cyanosis, clubbing, or edema.  2+ distal pulses.  LAB RESULTS Results for orders placed or performed during the hospital encounter of 03/08/23 (from the past 48 hour(s))  CBC     Status: Abnormal   Collection Time: 03/08/23 10:23 AM  Result Value Ref Range   WBC 8.8 4.0 - 10.5 K/uL   RBC 3.60 (L) 3.87 - 5.11 MIL/uL   Hemoglobin 11.2 (L) 12.0 - 15.0 g/dL   HCT 95.6 (L) 21.3 - 08.6 %   MCV 88.6 80.0 - 100.0 fL   MCH 31.1 26.0 - 34.0 pg   MCHC 35.1 30.0 - 36.0 g/dL   RDW 57.8 46.9 - 62.9 %   Platelets 352 150 - 400 K/uL   nRBC 0.0 0.0 - 0.2 %    Comment: Performed at [facility], [address], [place], [place] [address]  Urinalysis, Routine w reflex microscopic -Urine, Clean Catch     Status: Abnormal   Collection Time: 03/08/23 10:27 AM  Result Value Ref Range   Color, Urine YELLOW YELLOW   APPearance HAZY (A) CLEAR   Specific Gravity,  Urine 1.026 1.005 - 1.030   pH 5.0 5.0 - 8.0   Glucose, UA NEGATIVE NEGATIVE mg/dL   Hgb urine dipstick NEGATIVE NEGATIVE   Bilirubin Urine NEGATIVE NEGATIVE   Ketones, ur 20 (A) NEGATIVE mg/dL   Protein, ur 30 (A) NEGATIVE mg/dL   Nitrite NEGATIVE NEGATIVE   Leukocytes,Ua MODERATE (A)  NEGATIVE   RBC / HPF 0-5 0 - 5 RBC/hpf   WBC, UA 6-10 0 - 5 WBC/hpf   Bacteria, UA RARE (A) NONE SEEN   Squamous Epithelial / HPF 6-10 0 - 5 /HPF   Mucus PRESENT     Comment: Performed at [facility], [address], [place], [place] [address]    IMAGING No results found.  MAU COURSE Given Scopolamine patch Given Robinul Exertional pulse ox, did drop to high 80s on 2 laps but easily recovered. No evidence of anemia. No increased WOB or exam findings notable for SOB.   ASSESSMENT 1. Ptyalism   2. [redacted] weeks gestation of pregnancy   3. Nausea and vomiting in pregnancy prior to [redacted] weeks gestation   4. Shortness of breath during pregnancy     PLAN Discharge home F/u with PCP for SOB - sees [facility] - suspect it is just pregnancy. Rx for Scopolamine and Robinul sent to [facility] pharmacy for pt.  Allergies as of 03/08/2023       Reactions   Keflex [cephalexin] Hives   Metronidazole Nausea And Vomiting   To PO form (while pregnant)   Peanut (diagnostic) Hives   Tylenol [acetaminophen] Hives   Amoxicillin Hives, Rash   Rxn about 1 yr ago   Ancef [cefazolin] Rash   Perioral rash   Ibuprofen Rash   Penicillins Hives, Rash   Has patient had a PCN reaction causing immediate rash, facial/tongue/throat swelling, SOB or lightheadedness with hypotension: No Has patient had a PCN reaction causing severe rash involving mucus membranes or skin necrosis: No Has patient had a PCN reaction that required hospitalization: No Has patient had a PCN reaction occurring within the last 10 years: Yes If all of the above answers are "NO", then may proceed with Cephalosporin use.        Medication  List     TAKE these medications    Doxylamine-Pyridoxine 10-10 MG Tbec Commonly known as: Diclegis 2 tabs before bed for 2 days, next day can add 1 tab to am, following day and can add 1 tab mid day. Max 4 tabs in 24 hours   glycopyrrolate 2 MG tablet Commonly known as: Robinul-Forte Take 1 tablet (2 mg total) by mouth 3 (three) times daily.   hydrocortisone 2.5 % ointment Apply topically 2 (two) times daily. Until resolution.   omeprazole 20 MG capsule Commonly known as: PRILOSEC Take 1 capsule (20 mg total) by mouth daily.   ondansetron 4 MG disintegrating tablet Commonly known as: ZOFRAN-ODT Take 1 tablet (4 mg total) by mouth every 6 (six) hours as needed for nausea.   Prenatal Vitamins 27-0.8 MG Tabs Take 1 tablet by mouth daily.   pyridoxine 100 MG tablet Commonly known as: B-6 Take 1 tablet (100 mg total) by mouth daily.   Transderm-Scop 1 MG/3DAYS Generic drug: scopolamine Place 1 patch (1.5 mg total) onto the skin every 3 (three) days.        Evaluation does not show pathology that would require ongoing emergent intervention or inpatient treatment. Patient is hemodynamically stable and mentating appropriately. Discussed findings and plan with patient, who agrees with care plan. All questions answered. Return precautions discussed and outpatient follow up recommendations given.  Reva Bores, MD 03/08/2023 12:04 PM

## 2023-03-08 NOTE — MAU Note (Signed)
.  Erin Wilkins is a 27 y.o. at [redacted]w[redacted]d here in MAU reporting: has felt some SOB and dizziness this morning and also c/o spitting al the time. Denies any pain or cramping, vag discharge or bleeding.  LMP:  Onset of complaint: today Pain score: 0 Vitals:   03/08/23 0931  BP: 116/71  Pulse: (!) 101  Resp: 18  Temp: 98.6 F (37 C)  SpO2: 97%     FHT:144 Lab orders placed from triage:  u/a

## 2023-03-11 ENCOUNTER — Inpatient Hospital Stay (HOSPITAL_COMMUNITY)
Admission: AD | Admit: 2023-03-11 | Discharge: 2023-03-11 | Disposition: A | Discharge status: Home / Self Care | Payer: Medicaid Other | Attending: Obstetrics & Gynecology | Admitting: Obstetrics & Gynecology

## 2023-03-11 ENCOUNTER — Encounter (HOSPITAL_COMMUNITY): Payer: Self-pay | Admitting: Obstetrics & Gynecology

## 2023-03-11 DIAGNOSIS — O26891 Other specified pregnancy related conditions, first trimester: Secondary | ICD-10-CM | POA: Diagnosis not present

## 2023-03-11 DIAGNOSIS — R1111 Vomiting without nausea: Secondary | ICD-10-CM | POA: Diagnosis not present

## 2023-03-11 DIAGNOSIS — R112 Nausea with vomiting, unspecified: Secondary | ICD-10-CM | POA: Diagnosis present

## 2023-03-11 DIAGNOSIS — O219 Vomiting of pregnancy, unspecified: Secondary | ICD-10-CM | POA: Diagnosis not present

## 2023-03-11 DIAGNOSIS — R110 Nausea: Secondary | ICD-10-CM | POA: Diagnosis not present

## 2023-03-11 DIAGNOSIS — Z3A12 12 weeks gestation of pregnancy: Secondary | ICD-10-CM | POA: Insufficient documentation

## 2023-03-11 DIAGNOSIS — K5900 Constipation, unspecified: Secondary | ICD-10-CM | POA: Diagnosis not present

## 2023-03-11 LAB — URINALYSIS, ROUTINE W REFLEX MICROSCOPIC
Bilirubin Urine: NEGATIVE
Glucose, UA: NEGATIVE mg/dL
Hgb urine dipstick: NEGATIVE
Ketones, ur: 80 mg/dL — AB
Nitrite: NEGATIVE
Protein, ur: 100 mg/dL — AB
Specific Gravity, Urine: 1.029 (ref 1.005–1.030)
pH: 6 (ref 5.0–8.0)

## 2023-03-11 MED ORDER — ONDANSETRON 4 MG PO TBDP: 4.0000 mg | ORAL_TABLET | Freq: Three times a day (TID) | ORAL | 4 refills | Status: DC | PRN

## 2023-03-11 MED ORDER — ONDANSETRON 4 MG PO TBDP: 4.0000 mg | ORAL_TABLET | Freq: Three times a day (TID) | ORAL | 2 refills | Status: DC | PRN

## 2023-03-11 MED ORDER — SCOPOLAMINE 1 MG/3DAYS TD PT72
1.0000 | MEDICATED_PATCH | Freq: Once | TRANSDERMAL | Status: DC
  Filled 2023-03-11: qty 1

## 2023-03-11 MED ORDER — ONDANSETRON HCL 4 MG/2ML IJ SOLN
4.0000 mg | Freq: Once | INTRAMUSCULAR | Status: AC
  Administered 2023-03-11: 4 mg via INTRAVENOUS
  Filled 2023-03-11: qty 2

## 2023-03-11 MED ORDER — ONDANSETRON 4 MG PO TBDP
8.0000 mg | ORAL_TABLET | Freq: Once | ORAL | Status: DC
  Filled 2023-03-11: qty 2

## 2023-03-11 MED ORDER — LACTATED RINGERS IV BOLUS
500.0000 mL | Freq: Once | INTRAVENOUS | Status: AC
  Administered 2023-03-11: 500 mL via INTRAVENOUS

## 2023-03-11 NOTE — MAU Provider Note (Signed)
History     CSN: [id number]  Arrival date and time: 03/11/23 1227   Event Date/Time   First Provider Initiated Contact with Patient 03/11/23 1522      Chief Complaint  Patient presents with   Emesis   Nausea   Erin Wilkins , a  27 y.o. [phone] at [redacted]w[redacted]d presents to MAU with nausea and vomiting for 3 days unrelieved. She reported she ran out of her zofran and has been unable to receive a refill. Zofran typically releives her nausea and vomiting. She reports she has not had anything to eat since Friday. She reports she has lost 4lbs in a month.        Emesis     OB History     Gravida  5   Para  1   Term  1   Preterm      AB  3   Living  1      SAB  1   IAB  2   Ectopic      Multiple  0   Live Births  1           Past Medical History:  Diagnosis Date   Anemia    Irregular uterine bleeding 04/24/2018   UTI (urinary tract infection)    Vaginal candidiasis 07/08/2019    Past Surgical History:  Procedure Laterality Date   NO PAST SURGERIES      Family History  Problem Relation Age of Onset   Hypertension Mother     Social History   Tobacco Use   Smoking status: Never    Passive exposure: Never   Smokeless tobacco: Never  Vaping Use   Vaping status: Never Used  Substance Use Topics   Alcohol use: No   Drug use: No    Allergies:  Allergies  Allergen Reactions   Keflex [Cephalexin] Hives   Metronidazole Nausea And Vomiting    To PO form (while pregnant)   Peanut (Diagnostic) Hives   Tylenol [Acetaminophen] Hives   Amoxicillin Hives and Rash    Rxn about 1 yr ago   Ancef [Cefazolin] Rash    Perioral rash   Ibuprofen Rash   Penicillins Hives and Rash    Has patient had a PCN reaction causing immediate rash, facial/tongue/throat swelling, SOB or lightheadedness with hypotension: No Has patient had a PCN reaction causing severe rash involving mucus membranes or skin necrosis: No Has patient had a PCN reaction that required  hospitalization: No Has patient had a PCN reaction occurring within the last 10 years: Yes If all of the above answers are "NO", then may proceed with Cephalosporin use.     Medications Prior to Admission  Medication Sig Dispense Refill Last Dose   Doxylamine-Pyridoxine (DICLEGIS) 10-10 MG TBEC 2 tabs before bed for 2 days, next day can add 1 tab to am, following day and can add 1 tab mid day. Max 4 tabs in 24 hours 60 tablet 1    glycopyrrolate (ROBINUL-FORTE) 2 MG tablet Take 1 tablet (2 mg total) by mouth 3 (three) times daily. 90 tablet 3    hydrocortisone 2.5 % ointment Apply topically 2 (two) times daily. Until resolution. 30 g 0    omeprazole (PRILOSEC) 20 MG capsule Take 1 capsule (20 mg total) by mouth daily. 30 capsule 3    ondansetron (ZOFRAN-ODT) 4 MG disintegrating tablet Take 1 tablet (4 mg total) by mouth every 6 (six) hours as needed for nausea. 20 tablet 0  Prenatal Vit-Fe Fumarate-FA (PRENATAL VITAMINS) 27-0.8 MG TABS Take 1 tablet by mouth daily. 30 each 9    pyridOXINE (B-6) 100 MG tablet Take 1 tablet (100 mg total) by mouth daily. 1 tablet 0    scopolamine (TRANSDERM-SCOP) 1 MG/3DAYS Place 1 patch (1.5 mg total) onto the skin every 3 (three) days. 10 patch 12     Review of Systems  Constitutional: Negative.   HENT: Negative.    Respiratory: Negative.    Cardiovascular: Negative.   Gastrointestinal:  Positive for nausea and vomiting.   Physical Exam   Blood pressure 119/81, pulse 99, temperature 98.6 F (37 C), resp. rate 18, height 5' (1.524 m), weight 42.9 kg, last menstrual period 12/16/2022, unknown if currently breastfeeding.  Physical Exam Constitutional:      Appearance: Normal appearance.  HENT:     Nose: Nose normal.  Pulmonary:     Effort: Pulmonary effort is normal.  Musculoskeletal:     Cervical back: Normal range of motion.  Skin:    General: Skin is warm and dry.  Psychiatric:        Mood and Affect: Mood normal.     MAU Course   Procedures Orders Placed This Encounter  Procedures   Urinalysis, Routine w reflex microscopic -Urine, Clean Catch   Insert peripheral IV   Meds ordered this encounter  Medications   DISCONTD: ondansetron (ZOFRAN-ODT) disintegrating tablet 8 mg   scopolamine (TRANSDERM-SCOP) 1 MG/3DAYS 1.5 mg   ondansetron (ZOFRAN) injection 4 mg   lactated ringers bolus 500 mL   Results for orders placed or performed during the hospital encounter of 03/11/23 (from the past 24 hour(s))  Urinalysis, Routine w reflex microscopic -Urine, Clean Catch     Status: Abnormal   Collection Time: 03/11/23  2:09 PM  Result Value Ref Range   Color, Urine YELLOW YELLOW   APPearance HAZY (A) CLEAR   Specific Gravity, Urine 1.029 1.005 - 1.030   pH 6.0 5.0 - 8.0   Glucose, UA NEGATIVE NEGATIVE mg/dL   Hgb urine dipstick NEGATIVE NEGATIVE   Bilirubin Urine NEGATIVE NEGATIVE   Ketones, ur 80 (A) NEGATIVE mg/dL   Protein, ur 161 (A) NEGATIVE mg/dL   Nitrite NEGATIVE NEGATIVE   Leukocytes,Ua SMALL (A) NEGATIVE   RBC / HPF 0-5 0 - 5 RBC/hpf   WBC, UA 0-5 0 - 5 WBC/hpf   Bacteria, UA RARE (A) NONE SEEN   Squamous Epithelial / HPF 0-5 0 - 5 /HPF   Mucus PRESENT      MDM -UA positive for 80 ketones, leuks, 100 protein suspicion for mild dehydration IV fluids ordered -Zofran IV ordered, scopolamine ordered  -Symptoms relieved by zofran  -Stable for discharge   Assessment and Plan  Vomiting   Patient provided with refills for zofran. Patient stable for discharge. Patient is able to return to MAU as needed.   Cleda Mccreedy Student Nurse Midwife  03/11/2023, 3:22 PM

## 2023-03-11 NOTE — MAU Note (Signed)
.  Erin Wilkins is a 27 y.o. at [redacted]w[redacted]d here in MAU reporting: has had N/V for 3 dys. Has ran out nausea meds zofran called on Friday but did not get a renewal. Has not been able to keep anything down. Has lost about 4-5 lb since last week. C/o lower back pain as well today. LMP:  Onset of complaint: 3 days Pain score: 6-7 Vitals:   03/11/23 1408  BP: 119/81  Pulse: 99  Resp: 18  Temp: 98.6 F (37 C)     FHT:154 Lab orders placed from triage:   U/a

## 2023-03-20 ENCOUNTER — Encounter: Payer: Medicaid Other | Admitting: Family Medicine

## 2023-03-20 ENCOUNTER — Encounter: Payer: Self-pay | Admitting: Family Medicine

## 2023-03-20 ENCOUNTER — Telehealth: Payer: Self-pay | Admitting: Family Medicine
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
## 2023-03-21 ENCOUNTER — Inpatient Hospital Stay (HOSPITAL_COMMUNITY)
Admission: AD | Admit: 2023-03-21 | Discharge: 2023-03-21 | Disposition: A | Discharge status: Home / Self Care | Payer: Medicaid Other | Attending: Obstetrics and Gynecology | Admitting: Obstetrics and Gynecology

## 2023-03-21 ENCOUNTER — Ambulatory Visit: Payer: Medicaid Other | Admitting: Student

## 2023-03-21 VITALS — BP 109/78 | HR 111 | Temp 97.9°F | Wt 92.8 lb

## 2023-03-21 DIAGNOSIS — R000 Tachycardia, unspecified: Secondary | ICD-10-CM | POA: Insufficient documentation

## 2023-03-21 DIAGNOSIS — R112 Nausea with vomiting, unspecified: Secondary | ICD-10-CM

## 2023-03-21 DIAGNOSIS — O26891 Other specified pregnancy related conditions, first trimester: Secondary | ICD-10-CM

## 2023-03-21 DIAGNOSIS — Z3A13 13 weeks gestation of pregnancy: Secondary | ICD-10-CM | POA: Diagnosis not present

## 2023-03-21 DIAGNOSIS — O99281 Endocrine, nutritional and metabolic diseases complicating pregnancy, first trimester: Secondary | ICD-10-CM | POA: Diagnosis not present

## 2023-03-21 DIAGNOSIS — T782XXA Anaphylactic shock, unspecified, initial encounter: Secondary | ICD-10-CM | POA: Insufficient documentation

## 2023-03-21 DIAGNOSIS — E0590 Thyrotoxicosis, unspecified without thyrotoxic crisis or storm: Secondary | ICD-10-CM | POA: Diagnosis not present

## 2023-03-21 DIAGNOSIS — R42 Dizziness and giddiness: Secondary | ICD-10-CM | POA: Diagnosis present

## 2023-03-21 DIAGNOSIS — R9431 Abnormal electrocardiogram [ECG] [EKG]: Secondary | ICD-10-CM | POA: Diagnosis not present

## 2023-03-21 DIAGNOSIS — I951 Orthostatic hypotension: Secondary | ICD-10-CM | POA: Diagnosis not present

## 2023-03-21 LAB — COMPREHENSIVE METABOLIC PANEL
ALT: 25 U/L (ref 0–44)
AST: 23 U/L (ref 15–41)
Albumin: 3.7 g/dL (ref 3.5–5.0)
Alkaline Phosphatase: 44 U/L (ref 38–126)
Anion gap: 13 (ref 5–15)
BUN: 15 mg/dL (ref 6–20)
CO2: 20 mmol/L — ABNORMAL LOW (ref 22–32)
Calcium: 9.3 mg/dL (ref 8.9–10.3)
Chloride: 101 mmol/L (ref 98–111)
Creatinine, Ser: 0.62 mg/dL (ref 0.44–1.00)
GFR, Estimated: >60 mL/min (ref >60)
Glucose, Bld: 85 mg/dL (ref 70–99)
Potassium: 3.1 mmol/L — ABNORMAL LOW (ref 3.5–5.1)
Sodium: 134 mmol/L — ABNORMAL LOW (ref 135–145)
Total Bilirubin: 0.6 mg/dL (ref <1.2)
Total Protein: 7.5 g/dL (ref 6.5–8.1)

## 2023-03-21 LAB — CBC
HCT: 36.3 % (ref 36.0–46.0)
Hemoglobin: 13.1 g/dL (ref 12.0–15.0)
MCH: 30.8 pg (ref 26.0–34.0)
MCHC: 36.1 g/dL — ABNORMAL HIGH (ref 30.0–36.0)
MCV: 85.2 fL (ref 80.0–100.0)
Platelets: 330 10*3/uL (ref 150–400)
RBC: 4.26 MIL/uL (ref 3.87–5.11)
RDW: 12.3 % (ref 11.5–15.5)
WBC: 11.9 10*3/uL — ABNORMAL HIGH (ref 4.0–10.5)
nRBC: 0 % (ref 0.0–0.2)

## 2023-03-21 LAB — URINALYSIS, ROUTINE W REFLEX MICROSCOPIC
Bilirubin Urine: NEGATIVE
Glucose, UA: NEGATIVE mg/dL
Hgb urine dipstick: NEGATIVE
Ketones, ur: 20 mg/dL — AB
Nitrite: NEGATIVE
Protein, ur: 100 mg/dL — AB
Specific Gravity, Urine: 1.031 — ABNORMAL HIGH (ref 1.005–1.030)
pH: 6 (ref 5.0–8.0)

## 2023-03-21 LAB — TSH: TSH: <0.01 u[IU]/mL — ABNORMAL LOW (ref 0.350–4.500)

## 2023-03-21 LAB — T4, FREE: Free T4: 2.06 ng/dL — ABNORMAL HIGH (ref 0.61–1.12)

## 2023-03-21 MED ORDER — METHIMAZOLE 5 MG PO TABS: 5.0000 mg | ORAL_TABLET | Freq: Two times a day (BID) | ORAL | 2 refills | Status: DC

## 2023-03-21 MED ORDER — METHIMAZOLE 5 MG PO TABS
5.0000 mg | ORAL_TABLET | Freq: Two times a day (BID) | ORAL | Status: DC
  Filled 2023-03-21: qty 1

## 2023-03-21 MED ORDER — POTASSIUM CHLORIDE CRYS ER 20 MEQ PO TBCR
40.0000 meq | EXTENDED_RELEASE_TABLET | Freq: Every day | ORAL | 1 refills | Status: DC
Start: 2023-03-21 — End: 2023-07-10

## 2023-03-21 MED ORDER — EPINEPHRINE 0.3 MG/0.3ML IJ SOAJ: 0.3000 mg | INTRAMUSCULAR | 1 refills | Status: DC | PRN

## 2023-03-21 NOTE — MAU Provider Note (Signed)
Chief Complaint:  Tachycardia  HPI  HPI: Erin Wilkins is a 27 y.o. [phone] at 41w4dwho presents to maternity admissions reporting feeling like her heart is racing with associated lightheadedness with exertion x1 week; reporting from PCP office today due to positive orthostatic vitals after transition from seated to standing. Denies chest pain or SOB. Nausea improved with B6 gummies, reports drinking well but not eating much due to low appetite.   She reports good fetal movement, denies LOF, vaginal bleeding, vaginal itching/burning, urinary symptoms, h/a, dizziness, n/v, diarrhea, constipation or fever/chills.  She denies headache, visual changes or RUQ abdominal pain.   Past Medical History: Past Medical History:  Diagnosis Date   Anemia    Irregular uterine bleeding 04/24/2018   UTI (urinary tract infection)    Vaginal candidiasis 07/08/2019    Past obstetric history: OB History  Gravida Para Term Preterm AB Living  5 1 1   3 1   SAB IAB Ectopic Multiple Live Births  1 2   0 1    # Outcome Date GA Lbr Len/2nd Weight Sex Type Anes PTL Lv  5 Current           4 IAB 12/06/19          3 IAB 12/2019          2 Term 03/10/15 [redacted]w[redacted]d 18:39 / 02:00 3265 g M Vag-Spont EPI  LIV  1 SAB             Past Surgical History: Past Surgical History:  Procedure Laterality Date   NO PAST SURGERIES      Family History: Family History  Problem Relation Age of Onset   Hypertension Mother     Social History: Social History   Tobacco Use   Smoking status: Never    Passive exposure: Never   Smokeless tobacco: Never  Vaping Use   Vaping status: Never Used  Substance Use Topics   Alcohol use: No   Drug use: No    Allergies:  Allergies  Allergen Reactions   Cherry Flavor Anaphylaxis   Keflex [Cephalexin] Hives   Metronidazole Nausea And Vomiting    To PO form (while pregnant)   Peanut (Diagnostic) Hives   Tylenol [Acetaminophen] Hives   Amoxicillin Hives and Rash    Rxn  about 1 yr ago   Ancef [Cefazolin] Rash    Perioral rash   Ibuprofen Rash   Penicillins Hives and Rash    Has patient had a PCN reaction causing immediate rash, facial/tongue/throat swelling, SOB or lightheadedness with hypotension: No Has patient had a PCN reaction causing severe rash involving mucus membranes or skin necrosis: No Has patient had a PCN reaction that required hospitalization: No Has patient had a PCN reaction occurring within the last 10 years: Yes If all of the above answers are "NO", then may proceed with Cephalosporin use.     Meds:  Medications Prior to Admission  Medication Sig Dispense Refill Last Dose   Doxylamine-Pyridoxine (DICLEGIS) 10-10 MG TBEC 2 tabs before bed for 2 days, next day can add 1 tab to am, following day and can add 1 tab mid day. Max 4 tabs in 24 hours 60 tablet 1    EPINEPHrine (EPIPEN 2-PAK) 0.3 mg/0.3 mL IJ SOAJ injection Inject 0.3 mg into the muscle as needed for anaphylaxis. 1 each 1    glycopyrrolate (ROBINUL-FORTE) 2 MG tablet Take 1 tablet (2 mg total) by mouth 3 (three) times daily. 90 tablet 3  hydrocortisone 2.5 % ointment Apply topically 2 (two) times daily. Until resolution. 30 g 0    omeprazole (PRILOSEC) 20 MG capsule Take 1 capsule (20 mg total) by mouth daily. 30 capsule 3    ondansetron (ZOFRAN-ODT) 4 MG disintegrating tablet Take 1 tablet (4 mg total) by mouth every 6 (six) hours as needed for nausea. 20 tablet 0    ondansetron (ZOFRAN-ODT) 4 MG disintegrating tablet Take 1 tablet (4 mg total) by mouth every 8 (eight) hours as needed for nausea or vomiting. 60 tablet 2    Prenatal Vit-Fe Fumarate-FA (PRENATAL VITAMINS) 27-0.8 MG TABS Take 1 tablet by mouth daily. 30 each 9    pyridOXINE (B-6) 100 MG tablet Take 1 tablet (100 mg total) by mouth daily. 1 tablet 0    scopolamine (TRANSDERM-SCOP) 1 MG/3DAYS Place 1 patch (1.5 mg total) onto the skin every 3 (three) days. 10 patch 12     I have reviewed patient's Past Medical Hx,  Surgical Hx, Family Hx, Social Hx, medications and allergies.   ROS:  Review of Systems Other systems negative  Physical Exam  Patient Vitals for the past 24 hrs:  BP Temp Pulse Resp  03/21/23 1321 122/83 (!) 97.4 F (36.3 C) (!) 109 18   Constitutional: Well-developed, well-nourished female in no acute distress. No goiter. Cardiovascular: normal rate and rhythm Respiratory: normal effort, clear to auscultation bilaterally GI: Abd soft, non-tender, gravid appropriate for gestational age.   No rebound or guarding. MS: Extremities nontender, no edema, normal ROM Neurologic: Alert and oriented x 4.  GU: Neg CVAT.    Labs: Results for orders placed or performed during the hospital encounter of 03/21/23 (from the past 24 hour(s))  Urinalysis, Routine w reflex microscopic -Urine, Clean Catch     Status: Abnormal   Collection Time: 03/21/23  1:35 PM  Result Value Ref Range   Color, Urine AMBER (A) YELLOW   APPearance CLOUDY (A) CLEAR   Specific Gravity, Urine 1.031 (H) 1.005 - 1.030   pH 6.0 5.0 - 8.0   Glucose, UA NEGATIVE NEGATIVE mg/dL   Hgb urine dipstick NEGATIVE NEGATIVE   Bilirubin Urine NEGATIVE NEGATIVE   Ketones, ur 20 (A) NEGATIVE mg/dL   Protein, ur 952 (A) NEGATIVE mg/dL   Nitrite NEGATIVE NEGATIVE   Leukocytes,Ua TRACE (A) NEGATIVE   RBC / HPF 0-5 0 - 5 RBC/hpf   WBC, UA 6-10 0 - 5 WBC/hpf   Bacteria, UA FEW (A) NONE SEEN   Squamous Epithelial / HPF 0-5 0 - 5 /HPF   Mucus PRESENT    Amorphous Crystal PRESENT   CBC     Status: Abnormal   Collection Time: 03/21/23  2:27 PM  Result Value Ref Range   WBC 11.9 (H) 4.0 - 10.5 K/uL   RBC 4.26 3.87 - 5.11 MIL/uL   Hemoglobin 13.1 12.0 - 15.0 g/dL   HCT 84.1 32.4 - 40.1 %   MCV 85.2 80.0 - 100.0 fL   MCH 30.8 26.0 - 34.0 pg   MCHC 36.1 (H) 30.0 - 36.0 g/dL   RDW 02.7 25.3 - 66.4 %   Platelets 330 150 - 400 K/uL   nRBC 0.0 0.0 - 0.2 %  Comprehensive metabolic panel     Status: Abnormal   Collection Time: 03/21/23   2:27 PM  Result Value Ref Range   Sodium 134 (L) 135 - 145 mmol/L   Potassium 3.1 (L) 3.5 - 5.1 mmol/L   Chloride 101 98 - 111 mmol/L  CO2 20 (L) 22 - 32 mmol/L   Glucose, Bld 85 70 - 99 mg/dL   BUN 15 6 - 20 mg/dL   Creatinine, Ser 1.61 0.44 - 1.00 mg/dL   Calcium 9.3 8.9 - 09.6 mg/dL   Total Protein 7.5 6.5 - 8.1 g/dL   Albumin 3.7 3.5 - 5.0 g/dL   AST 23 15 - 41 U/L   ALT 25 0 - 44 U/L   Alkaline Phosphatase 44 38 - 126 U/L   Total Bilirubin 0.6 <1.2 mg/dL   GFR, Estimated >04 >54 mL/min   Anion gap 13 5 - 15  TSH     Status: Abnormal   Collection Time: 03/21/23  2:27 PM  Result Value Ref Range   TSH <0.010 (L) 0.350 - 4.500 uIU/mL  T4, free     Status: Abnormal   Collection Time: 03/21/23  2:27 PM  Result Value Ref Range   Free T4 2.06 (H) 0.61 - 1.12 ng/dL   --/--/O POS (09/81 1914)  Imaging:  No results found.  MAU Course/MDM: I have reviewed the triage vital signs and the nursing notes.   Pertinent labs & imaging results that were available during my care of the patient were reviewed by me and considered in my medical decision making (see chart for details).      I have reviewed her medical records including past results, notes and treatments.   I have ordered labs and reviewed results.   Treatments in MAU included UA, CBC, CMP, TSH, EKG.    Assessment: 1. [redacted] weeks gestation of pregnancy   2. Sinus tachycardia   3. 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 Nonanemic. Mild hypokalemia.  Low TSH, Elevated T4 TPO, TSI, T3 pending  Hyperthyroidism: - Start Methimazole 5mg  BID - replete K - follow up at [facility] within 1 week; message sent to scheduling   Plan: Discharge home Labor precautions and fetal kick counts Follow up in Office for prenatal visits and recheck  Pt stable at time of discharge.  Wyn Forster, MD FMOB Fellow, Faculty practice [facility], Center for  [facility]  03/21/2023 4:26 PM f

## 2023-03-21 NOTE — Progress Notes (Signed)
Orthostatics: Laying: 115/86 Pulse:118                      Sitting: 106/81 Pulse:123            Standing:100/83 Pulse:168

## 2023-03-21 NOTE — Patient Instructions (Signed)
It was great to see you! Thank you for allowing me to participate in your care!  I recommend that you always bring your medications to each appointment as this makes it easy to ensure we are on the correct medications and helps [place] not miss when refills are needed.  Our plans for today:  - Allergic Reaction This is an emergency, keep an Epipen with you at all times, and use if you develop(after eating something), continued vomitting or diarrhea, wheezing or shortness of breath, throat closing or difficulty breathing, mouth salivating (drooling).  We will send you for food allergy testing  Referral to allergy  Epipen as needed - Nausea/Weight loss You need to eat. This is likely coming from your nausea, decreasing your appetite. Start taking the Diglecgis. *If not able to eat within the next 3-4 days, make follow up appointment -Drink Booste or Ensure shakes and eat full meals regularly   -Digclegis:     Day 1: Take two tablets at bedtime. Day 2: If symptoms persist into the afternoon, continue with two tablets at bedtime. Day 3: If symptoms are still not controlled, take one tablet in the morning and two tablets at bedtime. Day 4: If symptoms persist, take one tablet in the morning, one tablet in the mid-afternoon, and two tablets at bedtime. *Max 4 pills in 24 hours - Dizziness Your blood volume is likely too low, causing your heart to beat faster and you to feel dizzy. This is because you are not eating, and your body can't keep hold of the fluids you are taking in. We are sending you to the MAU for IVF  -Follow up in MAU   Take care and seek immediate care sooner if you develop any concerns.   Dr. Bess Kinds, MD [facility]

## 2023-03-21 NOTE — Assessment & Plan Note (Addendum)
Patient comes in for continued vomiting/nausea with eating solids.  Patient able to tolerate fluids and fruits, but gets upset stomach with eating solid food.  Patient is losing weight, down 2 pounds from last visit 10 days ago.  Patient was using Zofran, but stopped secondary to constipation.  Discussed starting diplegia's, with dosing regimen, patient in agreement with plan.  Patient noted to have orthostatic hypotension, sent to ED for IV fluid. - Diclegis per regimen - Follow-up 1 week if symptoms persist

## 2023-03-21 NOTE — MAU Note (Signed)
.  Erin Wilkins is a 27 y.o. at [redacted]w[redacted]d here in MAU reporting: went to [facility] appointment and they did orthostatic b/p and they were :Orthostatics: Laying: 115/86Pulse:118                      Sitting: 106/81    Pulse:123                       Standing:100/83 Pulse:168  Pt reports she has been feeling like her heart racing and feels dizzy when she does any exertion.  Stated her nausea has improved just taking B6 gummies. Reports she is just not hungry but is still drinking water LMP:  Onset of complaint: one week Pain score: 0 Vitals:   03/21/23 1321  BP: 122/83  Pulse: (!) 109  Resp: 18  Temp: (!) 97.4 F (36.3 C)     FHT:143 Lab orders placed from triage:

## 2023-03-21 NOTE — Assessment & Plan Note (Addendum)
Patient noted to have orthostatic hypotension today in clinic.  Patient with poor p.o. secondary to nausea vomiting with solid foods.  Patient appreciates she drinks fluids, but has not been eating well.  Patient with increase in heart rate diagnostic of orthostatic hypotension, when standing.  Patient feels dizzy and lightheaded/unwell.  Given symptoms and poor p.o., will recommend patient be seen MAU for IVF. Patient walked and checked in to MAU.  -F/u w/ MAU for IVF

## 2023-03-21 NOTE — Progress Notes (Signed)
SUBJECTIVE:   CHIEF COMPLAINT / HPI:   Loss of appetite and dizziness Patient appreciates dizziness and loss of appetite for the last 1 to 2 weeks.  Patient appreciates the vomiting has seemed to subside, unless she tries to eat something solid.  Patient appreciates she can tolerate liquids and fruits.  Patient was using Zofran, but developed constipation and stopped using it.  Patient has not tried Diclegis.  Patient has been seen in the ED x 2 for this.  Patient appreciates dizziness when walking/with activity, and appreciates her heart rate beats fast.  Allergic Reaction Patient appreciates recent allergic reaction where she was drinking some electrolyte drink/juice, with cherry, when she developed throat swelling.  Her mother called EMS, they arrived and gave her Benadryl.  PERTINENT  PMH / PSH:    OBJECTIVE:  BP 109/78   Pulse (!) 111   Temp 97.9 F (36.6 C)   Wt 92 lb 12.8 oz (42.1 kg)   LMP 12/16/2022 (Exact Date)   BMI 18.12 kg/m  Physical Exam Constitutional:      General: She is not in acute distress.    Appearance: Normal appearance. She is not ill-appearing.  Cardiovascular:     Rate and Rhythm: Regular rhythm. Tachycardia present.     Pulses: Normal pulses.     Heart sounds: Normal heart sounds. No murmur heard.    No friction rub. No gallop.  Pulmonary:     Effort: Pulmonary effort is normal. No respiratory distress.     Breath sounds: Normal breath sounds. No stridor. No wheezing, rhonchi or rales.  Skin:    Capillary Refill: Capillary refill takes less than 2 seconds.     Coloration: Skin is not pale.  Neurological:     Mental Status: She is alert.  Psychiatric:        Mood and Affect: Mood normal.        Behavior: Behavior normal.      ASSESSMENT/PLAN:   Assessment & Plan Anaphylaxis, initial encounter Patient has recent episode of anaphylaxis, after drinking an electrolyte beverage with cherry in it.  Patient appreciates that her throat was  swelling, EMS was called and arrived to manage her care.  Patient appears to have allergy, unsure what is the cause likely cherry.  Will write prescription for EpiPen, and discussed how to take it/carry it everywhere. - EpiPen x 2, with precautions/reviewed how to use - Referral to allergist for allergy testing Nausea and vomiting, unspecified vomiting type Patient comes in for continued vomiting/nausea with eating solids.  Patient able to tolerate fluids and fruits, but gets upset stomach with eating solid food.  Patient is losing weight, down 2 pounds from last visit 10 days ago.  Patient was using Zofran, but stopped secondary to constipation.  Discussed starting diplegia's, with dosing regimen, patient in agreement with plan.  Patient noted to have orthostatic hypotension, sent to ED for IV fluid. - Diclegis per regimen - Follow-up 1 week if symptoms persist Orthostatic hypotension Patient noted to have orthostatic hypotension today in clinic.  Patient with poor p.o. secondary to nausea vomiting with solid foods.  Patient appreciates she drinks fluids, but has not been eating well.  Patient with increase in heart rate diagnostic of orthostatic hypotension, when standing.  Patient feels dizzy and lightheaded/unwell.  Given symptoms and poor p.o., will recommend patient be seen MAU for IVF. Patient walked and checked in to MAU.  -F/u w/ MAU for IVF No follow-ups on file. Bess Kinds, MD 03/21/2023, 3:53  PM PGY-3, [facility]

## 2023-03-21 NOTE — Assessment & Plan Note (Addendum)
Patient has recent episode of anaphylaxis, after drinking an electrolyte beverage with cherry in it.  Patient appreciates that her throat was swelling, EMS was called and arrived to manage her care.  Patient appears to have allergy, unsure what is the cause likely cherry.  Will write prescription for EpiPen, and discussed how to take it/carry it everywhere. - EpiPen x 2, with precautions/reviewed how to use - Referral to allergist for allergy testing

## 2023-03-22 LAB — T3: T3, Total: 397 ng/dL — ABNORMAL HIGH (ref 71–180)

## 2023-03-22 LAB — THYROID PEROXIDASE ANTIBODY: Thyroperoxidase Ab SerPl-aCnc: 14 [IU]/mL (ref 0–34)

## 2023-03-22 LAB — THYROID STIMULATING IMMUNOGLOBULIN: Thyroid Stimulating Immunoglob: <0.1 [IU]/L (ref 0.00–0.55)

## 2023-03-28 ENCOUNTER — Ambulatory Visit: Payer: Medicaid Other | Admitting: Obstetrics & Gynecology

## 2023-03-28 VITALS — BP 110/71 | HR 97 | Wt 106.0 lb

## 2023-03-28 DIAGNOSIS — Z3A14 14 weeks gestation of pregnancy: Secondary | ICD-10-CM

## 2023-03-28 DIAGNOSIS — E0590 Thyrotoxicosis, unspecified without thyrotoxic crisis or storm: Secondary | ICD-10-CM | POA: Insufficient documentation

## 2023-03-28 DIAGNOSIS — Z3481 Encounter for supervision of other normal pregnancy, first trimester: Secondary | ICD-10-CM | POA: Diagnosis not present

## 2023-03-28 DIAGNOSIS — O99282 Endocrine, nutritional and metabolic diseases complicating pregnancy, second trimester: Secondary | ICD-10-CM

## 2023-03-28 DIAGNOSIS — O99281 Endocrine, nutritional and metabolic diseases complicating pregnancy, first trimester: Secondary | ICD-10-CM | POA: Diagnosis not present

## 2023-03-28 NOTE — Progress Notes (Addendum)
NOB Xfer from [facility].  Reports no concerns today.  Last PAP 02/19/2023

## 2023-03-28 NOTE — Progress Notes (Signed)
  Subjective:Transfer from [facility] with hyperthyroidism    Erin Wilkins is a [id number] [redacted]w[redacted]d being seen today for her first obstetrical visit.  Her obstetrical history is significant for  hyperthyroidism . Patient does intend to breast feed. Pregnancy history fully reviewed.  Patient reports no complaints.  Vitals:   03/28/23 1453  BP: 110/71  Pulse: 97  Weight: 106 lb (48.1 kg)    HISTORY: OB History  Gravida Para Term Preterm AB Living  5 1 1   3 1   SAB IAB Ectopic Multiple Live Births  1 2   0 1    # Outcome Date GA Lbr Len/2nd Weight Sex Type Anes PTL Lv  5 Current           4 IAB 12/06/19          3 IAB 12/2019          2 Term 03/10/15 [redacted]w[redacted]d 18:39 / 02:00 7 lb 3.2 oz (3.265 kg) M Vag-Spont EPI  LIV  1 SAB            Past Medical History:  Diagnosis Date   Anemia    Irregular uterine bleeding 04/24/2018   UTI (urinary tract infection)    Vaginal candidiasis 07/08/2019   Past Surgical History:  Procedure Laterality Date   NO PAST SURGERIES     Family History  Problem Relation Age of Onset   Hypertension Mother      Exam    Uterus:   14 weeks  Pelvic Exam:    Perineum:    Vulva:    Vagina:     pH:    Cervix:    Adnexa:    Bony Pelvis:   System: Breast:     Skin: normal coloration and turgor, no rashes    Neurologic: oriented, normal mood   Extremities: normal strength, tone, and muscle mass   HEENT Mild exophthalmos    Mouth/Teeth mucous membranes moist, pharynx normal without lesions and dental hygiene good   Neck supple and no masses   Cardiovascular: regular rate and rhythm, no murmurs or gallops   Respiratory:  appears well, vitals normal, no respiratory distress, acyanotic, normal RR, chest clear, no wheezing, crepitations, rhonchi, normal symmetric air entry   Abdomen: soft, non-tender; bowel sounds normal; no masses,  no organomegaly   Urinary:       Assessment:    Pregnancy: [id number] Patient Active Problem List   Diagnosis Date Noted    Hyperthyroidism 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 Prenatal vitamins. Problem list reviewed and updated. Genetic Screening discussed : ordered.  Ultrasound discussed; fetal survey: ordered.  Follow up in 4 weeks. 50% of 30 min visit spent on counseling and coordination of care.  TFT repeat in 4 weeks   Scheryl Darter 03/28/2023

## 2023-03-29 LAB — CBC/D/PLT+RPR+RH+ABO+RUBIGG...
Antibody Screen: NEGATIVE
Basophils Absolute: 0 10*3/uL (ref 0.0–0.2)
Basos: 0 %
EOS (ABSOLUTE): 0.1 10*3/uL (ref 0.0–0.4)
Eos: 1 %
HCV Ab: NONREACTIVE
HIV Screen 4th Generation wRfx: NONREACTIVE
Hematocrit: 31.4 % — ABNORMAL LOW (ref 34.0–46.6)
Hemoglobin: 10.2 g/dL — ABNORMAL LOW (ref 11.1–15.9)
Hepatitis B Surface Ag: NEGATIVE
Immature Grans (Abs): 0.1 10*3/uL (ref 0.0–0.1)
Immature Granulocytes: 1 %
Lymphocytes Absolute: 2.3 10*3/uL (ref 0.7–3.1)
Lymphs: 22 %
MCH: 31.1 pg (ref 26.6–33.0)
MCHC: 32.5 g/dL (ref 31.5–35.7)
MCV: 96 fL (ref 79–97)
Monocytes Absolute: 0.6 10*3/uL (ref 0.1–0.9)
Monocytes: 6 %
Neutrophils Absolute: 7.4 10*3/uL — ABNORMAL HIGH (ref 1.4–7.0)
Neutrophils: 70 %
Platelets: 211 10*3/uL (ref 150–450)
RBC: 3.28 x10E6/uL — ABNORMAL LOW (ref 3.77–5.28)
RDW: 12.6 % (ref 11.7–15.4)
RPR Ser Ql: NONREACTIVE
Rh Factor: POSITIVE
Rubella Antibodies, IGG: 1.85 {index} (ref >0.99)
WBC: 10.4 10*3/uL (ref 3.4–10.8)

## 2023-03-29 LAB — HEMOGLOBIN A1C
Est. average glucose Bld gHb Est-mCnc: 100 mg/dL
Hgb A1c MFr Bld: 5.1 % (ref 4.8–5.6)

## 2023-03-29 LAB — HCV INTERPRETATION

## 2023-03-30 LAB — CULTURE, OB URINE

## 2023-03-30 LAB — URINE CULTURE, OB REFLEX: Organism ID, Bacteria: NO GROWTH

## 2023-04-01 ENCOUNTER — Other Ambulatory Visit: Payer: Medicaid Other

## 2023-04-11 ENCOUNTER — Other Ambulatory Visit: Payer: Medicaid Other

## 2023-04-19 LAB — PANORAMA PRENATAL TEST FULL PANEL:PANORAMA TEST PLUS 5 ADDITIONAL MICRODELETIONS: FETAL FRACTION: 8.3

## 2023-04-21 LAB — HORIZON CUSTOM: REPORT SUMMARY: NEGATIVE

## 2023-04-23 ENCOUNTER — Encounter: Payer: Medicaid Other | Admitting: Obstetrics and Gynecology

## 2023-04-29 ENCOUNTER — Encounter: Payer: Medicaid Other | Admitting: Obstetrics and Gynecology

## 2023-05-02 ENCOUNTER — Ambulatory Visit (INDEPENDENT_AMBULATORY_CARE_PROVIDER_SITE_OTHER): Payer: Medicaid Other | Admitting: Obstetrics

## 2023-05-02 VITALS — BP 111/76 | HR 91 | Wt 111.6 lb

## 2023-05-02 DIAGNOSIS — Z8639 Personal history of other endocrine, nutritional and metabolic disease: Secondary | ICD-10-CM

## 2023-05-02 DIAGNOSIS — Z3A19 19 weeks gestation of pregnancy: Secondary | ICD-10-CM

## 2023-05-02 DIAGNOSIS — O0990 Supervision of high risk pregnancy, unspecified, unspecified trimester: Secondary | ICD-10-CM

## 2023-05-02 DIAGNOSIS — O99281 Endocrine, nutritional and metabolic diseases complicating pregnancy, first trimester: Secondary | ICD-10-CM

## 2023-05-02 DIAGNOSIS — O99282 Endocrine, nutritional and metabolic diseases complicating pregnancy, second trimester: Secondary | ICD-10-CM

## 2023-05-02 DIAGNOSIS — O0992 Supervision of high risk pregnancy, unspecified, second trimester: Secondary | ICD-10-CM

## 2023-05-02 DIAGNOSIS — E0590 Thyrotoxicosis, unspecified without thyrotoxic crisis or storm: Secondary | ICD-10-CM

## 2023-05-02 MED ORDER — FERROUS SULFATE 325 (65 FE) MG PO TABS: 325.0000 mg | ORAL_TABLET | ORAL | 5 refills | Status: DC

## 2023-05-02 MED ORDER — VITAFOL GUMMIES 3.33-0.333-34.8 MG PO CHEW: 3.0000 | CHEWABLE_TABLET | Freq: Every day | ORAL | 11 refills | Status: AC

## 2023-05-02 NOTE — Progress Notes (Signed)
Pt. Presents for rob. Pt. Complains of rash around panty line that is itchy.

## 2023-05-02 NOTE — Progress Notes (Addendum)
Subjective:  Erin Wilkins is a 27 y.o. [phone] at [redacted]w[redacted]d being seen today for ongoing prenatal care.  She is currently monitored for the following issues for this low-risk pregnancy and has Birth control counseling; Nausea & vomiting; Supervision of normal pregnancy in first trimester; Routine screening for STI (sexually transmitted infection); Lower abdominal pain; Vaginal trauma; Groin pain, chronic, left; Epigastric pain; Anaphylactic syndrome; Orthostatic hypotension; and Hyperthyroidism affecting pregnancy in first trimester on their problem list.  Patient reports no complaints.  Contractions: Not present. Vag. Bleeding: None.  Movement: Present. Denies leaking of fluid.   The following portions of the patient's history were reviewed and updated as appropriate: allergies, current medications, past family history, past medical history, past social history, past surgical history and problem list. Problem list updated.  Objective:   Vitals:   05/02/23 0959  BP: 111/76  Pulse: 91  Weight: 111 lb 9.6 oz (50.6 kg)    Fetal Status: Fetal Heart Rate (bpm): 152   Movement: Present     General:  Alert, oriented and cooperative. Patient is in no acute distress.  Skin: Skin is warm and dry. No rash noted.   Cardiovascular: Normal heart rate noted  Respiratory: Normal respiratory effort, no problems with respiration noted  Abdomen: Soft, gravid, appropriate for gestational age. Pain/Pressure: Present (pressure)     Pelvic:  Cervical exam deferred        Extremities: Normal range of motion.  Edema: None  Mental Status: Normal mood and affect. Normal behavior. Normal judgment and thought content.   Urinalysis:      Assessment and Plan:  Pregnancy: G5P1031 at [redacted]w[redacted]d  1. Supervision of high risk pregnancy, antepartum (Primary) Rx: - Prenatal Vit-Fe Phos-FA-Omega (VITAFOL GUMMIES) 3.33-0.333-34.8 MG CHEW; Chew 3 tablets by mouth daily before breakfast.  Dispense: 90 tablet; Refill: 11 - [place]  MFM OB DETAIL +14 WK; Future - Basic metabolic panel  2. Hyperthyroidism affecting pregnancy in first trimester - taking Methimazole ( Tapazole ) 5 mg po bid starting 03/21/2023, at ~ 13 weeks  Rx: - AMB referral to maternal fetal medicine - Ambulatory referral to Endocrinology - ferrous sulfate 325 (65 FE) MG tablet; Take 1 tablet (325 mg total) by mouth every other day.  Dispense: 60 tablet; Refill: 5  3. Hx of hypokalemia - taking potassium 40 mEq po daily - check BMP today    Preterm labor symptoms and general obstetric precautions including but not limited to vaginal bleeding, contractions, leaking of fluid and fetal movement were reviewed in detail with the patient. Please refer to After Visit Summary for other counseling recommendations.   Return in about 4 weeks (around 05/30/2023) for ROB.   Brock Bad, MD 05/02/2023

## 2023-05-03 LAB — BASIC METABOLIC PANEL
BUN/Creatinine Ratio: 20 (ref 9–23)
BUN: 10 mg/dL (ref 6–20)
CO2: 17 mmol/L — ABNORMAL LOW (ref 20–29)
Calcium: 8.8 mg/dL (ref 8.7–10.2)
Chloride: 108 mmol/L — ABNORMAL HIGH (ref 96–106)
Creatinine, Ser: 0.51 mg/dL — ABNORMAL LOW (ref 0.57–1.00)
Glucose: 78 mg/dL (ref 70–99)
Potassium: 4.5 mmol/L (ref 3.5–5.2)
Sodium: 140 mmol/L (ref 134–144)
eGFR: 131 mL/min/{1.73_m2} (ref >59)

## 2023-05-08 NOTE — L&D Delivery Note (Addendum)
 OB/GYN Faculty Practice Delivery Note  Opie Liao is a 28 y.o. [phone] s/p NSVD at [redacted]w[redacted]d. She was admitted for SOL.   ROM: 13h 49m with clear fluid GBS Status: neg Maximum Maternal Temperature: 98.9  Labor Progress: SOL with SROM, Pitocin  augmentation  Delivery Date/Time: 09/10/2023 @1249  Delivery: Called to room and patient was complete and pushing. Head delivered LOA. Nuchal cord present x1. Shoulder and body delivered with somersault maneuver. Nuchal then reduced. Infant with spontaneous cry, placed on mother's abdomen, dried and stimulated. Cord clamped x 2 after 1-minute delay, and cut by grandmother. Cord blood drawn. Placenta delivered spontaneously with gentle cord traction. Fundus firm with massage and Pitocin . Labia, perineum, vagina, and cervix inspected with 1st degree laceration. Repaired in the usual fashion.   Placenta: intact to L&D Complications: none Lacerations: 1st degree EBL: 232 Analgesia: epidural  Infant: girl "Demi"  APGARs 8/9  weight pending  Wilford Hanks, Student-MidWife  09/10/2023 1:13 PM

## 2023-05-09 ENCOUNTER — Encounter: Payer: Self-pay | Admitting: *Deleted

## 2023-05-14 ENCOUNTER — Ambulatory Visit: Payer: Medicaid Other

## 2023-05-17 ENCOUNTER — Ambulatory Visit: Payer: Self-pay | Admitting: Allergy

## 2023-05-23 ENCOUNTER — Ambulatory Visit: Payer: Medicaid Other | Admitting: Obstetrics and Gynecology

## 2023-05-23 ENCOUNTER — Encounter: Payer: Self-pay | Admitting: Obstetrics and Gynecology

## 2023-05-23 VITALS — BP 108/73 | HR 92 | Wt 117.2 lb

## 2023-05-23 DIAGNOSIS — L308 Other specified dermatitis: Secondary | ICD-10-CM

## 2023-05-23 DIAGNOSIS — O0992 Supervision of high risk pregnancy, unspecified, second trimester: Secondary | ICD-10-CM

## 2023-05-23 DIAGNOSIS — O0990 Supervision of high risk pregnancy, unspecified, unspecified trimester: Secondary | ICD-10-CM

## 2023-05-23 DIAGNOSIS — E0590 Thyrotoxicosis, unspecified without thyrotoxic crisis or storm: Secondary | ICD-10-CM

## 2023-05-23 DIAGNOSIS — Z3A22 22 weeks gestation of pregnancy: Secondary | ICD-10-CM

## 2023-05-23 DIAGNOSIS — O99282 Endocrine, nutritional and metabolic diseases complicating pregnancy, second trimester: Secondary | ICD-10-CM

## 2023-05-23 MED ORDER — HYDROCORTISONE 0.5 % EX CREA: 1.0000 | TOPICAL_CREAM | Freq: Two times a day (BID) | CUTANEOUS | 1 refills | Status: DC

## 2023-05-23 NOTE — Progress Notes (Signed)
  HIGH-RISK PREGNANCY OFFICE VISIT Patient name: Erin Wilkins MRN [id number]  Date of birth: 1995/06/05 Chief Complaint:   No chief complaint on file.  History of Present Illness:   Mystie Delhierro is a 28 y.o. [phone] female at [redacted]w[redacted]d with an Estimated Date of Delivery: 09/22/23 being seen today for ongoing management of a high-risk pregnancy complicated by  hyperthyroidism on Methimazole 5 mg BID Today she reports  itchy skin on abdomen. "I'm not sure if they are stretch marks or not."  Contractions: Not present. Vag. Bleeding: None.  Movement: Present. denies leaking of fluid.  Review of Systems:   Pertinent items are noted in HPI Denies abnormal vaginal discharge w/ itching/odor/irritation, headaches, visual changes, shortness of breath, chest pain, abdominal pain, severe nausea/vomiting, or problems with urination or bowel movements unless otherwise stated above. Pertinent History Reviewed:  Reviewed past medical,surgical, social, obstetrical and family history.  Reviewed problem list, medications and allergies. Physical Assessment:   Vitals:   05/23/23 1614  BP: 108/73  Pulse: 92  Weight: 117 lb 3.2 oz (53.2 kg)  Body mass index is 22.89 kg/m.           Physical Examination:   General appearance: alert, well appearing, and in no distress, oriented to person, place, and time, and normal appearing weight  Mental status: alert, oriented to person, place, and time, normal mood, behavior, speech, dress, motor activity, and thought processes  Skin: warm & dry   Extremities: Edema: None    Cardiovascular: normal heart rate noted  Respiratory: normal respiratory effort, no distress  Abdomen: gravid, soft, non-tender, dry, scaly skin, with excoriations  Pelvic: Cervical exam deferred         Fetal Status: Fetal Heart Rate (bpm): 144 Fundal Height: 22 cm Movement: Present    Fetal Surveillance Testing today: none   No results found for this or any previous visit (from the past 24  hours).  Assessment & Plan:  1) High-risk pregnancy G5P1031 at [redacted]w[redacted]d with an Estimated Date of Delivery: 09/22/23   2) Supervision of high risk pregnancy, antepartum (Primary)   3) Hyperthyroidism affecting pregnancy in first trimester - Ambulatory referral to Endocrinology - Taking Methimazole 5 mg BID  4) Pruritic dermatitis - Advised via MyChart message to use Aveeno oatmeal bathing products to soothe itchy skin along with hydrocortisone cream - prescription for: hydrocortisone cream 0.5 %; Apply 1 Application topically 2 (two) times daily. Apply to affected areas twice daily  Dispense: 56 g; Refill: 1  5) [redacted] weeks gestation of pregnancy    Meds:  Meds ordered this encounter  Medications   hydrocortisone cream 0.5 %    Sig: Apply 1 Application topically 2 (two) times daily. Apply to affected areas twice daily    Dispense:  56 g    Refill:  1    Supervising Provider:   Reva Bores [[id number]]    Labs/procedures today: none  Treatment Plan:  see endocrinology  Reviewed: Preterm labor symptoms and general obstetric precautions including but not limited to vaginal bleeding, contractions, leaking of fluid and fetal movement were reviewed in detail with the patient.  All questions were answered. Has home bp cuff. Check bp weekly, let us know if >140/90.   Follow-up: Return in about 4 weeks (around 06/20/2023) for Return OB visit.  Orders Placed This Encounter  Procedures   Ambulatory referral to Endocrinology   Raelyn Mora MSN, CNM 05/23/2023 4:48 PM

## 2023-05-23 NOTE — Progress Notes (Signed)
Externship with x-rays. Any concerns? Dental office- Nitrous oxide exposure? C/0 very dry abdominal skin.

## 2023-05-31 ENCOUNTER — Ambulatory Visit (HOSPITAL_BASED_OUTPATIENT_CLINIC_OR_DEPARTMENT_OTHER): Payer: Medicaid Other | Admitting: Obstetrics and Gynecology

## 2023-05-31 ENCOUNTER — Ambulatory Visit: Payer: Medicaid Other | Admitting: Student

## 2023-05-31 ENCOUNTER — Ambulatory Visit: Payer: Medicaid Other | Attending: Obstetrics

## 2023-05-31 ENCOUNTER — Ambulatory Visit: Payer: Medicaid Other

## 2023-05-31 ENCOUNTER — Other Ambulatory Visit: Payer: Self-pay

## 2023-05-31 DIAGNOSIS — O99282 Endocrine, nutritional and metabolic diseases complicating pregnancy, second trimester: Secondary | ICD-10-CM | POA: Diagnosis not present

## 2023-05-31 DIAGNOSIS — E0590 Thyrotoxicosis, unspecified without thyrotoxic crisis or storm: Secondary | ICD-10-CM | POA: Insufficient documentation

## 2023-05-31 DIAGNOSIS — Z3A23 23 weeks gestation of pregnancy: Secondary | ICD-10-CM | POA: Diagnosis not present

## 2023-05-31 DIAGNOSIS — O0990 Supervision of high risk pregnancy, unspecified, unspecified trimester: Secondary | ICD-10-CM | POA: Diagnosis not present

## 2023-05-31 NOTE — Progress Notes (Signed)
  Maternal-Fetal Medicine-Consultation  I had the pleasure of seeing [name] today at [facility]. She is G5 P1031 at 23w 5d gestation and is here for fetal anatomy scan. Past medical history is significant for hyperthyroidism.    In November 2024, at her visit to [facility], she was prescribed antithyroid medication, and the patient takes methimazole 5 mg twice daily.  She had severe nausea and vomiting in early pregnancy and TSH level was significantly low, and T3 and T4 were increased.  Thyroid-stimulating immunoglobulin (TSI) is not increased.  Patient reports occasional palpitations. No symptoms of of palpitations or weight loss before pregnancy.  She has never had thyroid ultrasound. She is waiting to see an endocrinologist. She does not have hypertension or diabetes or any other chronic medical conditions.  Past surgical history: Nil of note. Prenatal: On cell-free fetal DNA screening, the risks of fetal aneuploidies are not increased.  Ultrasound We performed a fetal anatomical survey.  Amniotic fluid is normal and good fetal activity seen.  Fetal biometry is consistent with the previously established dates.  Fetal heart rate was 144 bpm/min.  No markers of aneuploidies or obvious fetal structural defects are seen.  Our concerns include: Gestational thyrotoxicosis versus hyperthyroidism Patient has severe vomiting the first trimester pregnancy (hyperemesis).  This can lead to suppression of TSH by by increased hCG secretion.  T4 and T3 levels can also be elevated in up to half of patients.  TSI levels were normal, and this is not in consistent with Graves' disease.  -Gestational hyperthyroidism usually resolves with advancing gestation.  An hour patient does not have severe symptoms of hyperthyroidism.  -I did not recommend discontinuing methimazole now.  Although I strongly suspect gestational hyperthyroidism, I counseled the patient on hyperthyroidism in  pregnancy.   -Antithyroid drugs form the first-line of treatment and hypothyroidism in pregnancy.  The risk of congenital malformations with methimazole is very low.  Alternative medication includes propylthiouracil (PTU).  This drug can be associated with liver toxicity.  -Beta-blockers should be added if patient has symptoms of tachycardia.  Hypothyroidism can lead to fetal growth restriction, congestive heart failure and fetal hydrops.  Preterm delivery rate is slightly increased.  I discussed the importance of antithyroid medications if hyperthyroidism is confirmed. Thyroid ultrasound should be considered if TRAb is absent.  -Methimazole should be discontinued or adjusted at [redacted] weeks gestation to keep the free thyroxine levels in the upper limit of normal.  -I reassured the patient that vaginal delivery can be safely attempted.  Recommendations -Check TRAb levels. -Endocrinology consultation and if hyperthyroidism is ruled out, methimazole should be discontinued. -An appointment was made for her to return in 4 weeks for fetal growth assessment. -Weekly BPP from [redacted] weeks gestation if patient still takes methimazole. -In-basket message sent to her Ob providers to initiate endocrinology referral.  Thank you for consultation.  If you have any questions or concerns, please contact me the [facility].  Consultation including face-to-face (more than 50%) counseling 30 minutes.

## 2023-06-04 ENCOUNTER — Encounter: Payer: Self-pay | Admitting: Obstetrics and Gynecology

## 2023-06-20 ENCOUNTER — Other Ambulatory Visit: Payer: Medicaid Other

## 2023-06-20 ENCOUNTER — Encounter: Payer: Medicaid Other | Admitting: Obstetrics and Gynecology

## 2023-06-25 ENCOUNTER — Other Ambulatory Visit: Payer: Medicaid Other

## 2023-06-25 ENCOUNTER — Encounter: Payer: Medicaid Other | Admitting: Advanced Practice Midwife

## 2023-07-03 ENCOUNTER — Telehealth: Payer: Self-pay

## 2023-07-03 NOTE — Telephone Encounter (Signed)
 Pt called in stating that yesterday she started feeling pressure on right side. Pt reports that she is peeing more at night. Denies pain with urination, leaking fluid, vaginal bleeding, and reports good fetal movement. Pt states that she does not feel like baby is coming out, pressure just feels similar to menstrual cycle. Advised pt to monitor symptoms and if they change report to hospital or call office, pt agreed.

## 2023-07-04 ENCOUNTER — Other Ambulatory Visit: Payer: Self-pay

## 2023-07-04 DIAGNOSIS — E0590 Thyrotoxicosis, unspecified without thyrotoxic crisis or storm: Secondary | ICD-10-CM

## 2023-07-09 ENCOUNTER — Other Ambulatory Visit: Payer: Medicaid Other

## 2023-07-09 DIAGNOSIS — E0590 Thyrotoxicosis, unspecified without thyrotoxic crisis or storm: Secondary | ICD-10-CM | POA: Diagnosis not present

## 2023-07-09 DIAGNOSIS — O99281 Endocrine, nutritional and metabolic diseases complicating pregnancy, first trimester: Secondary | ICD-10-CM | POA: Diagnosis not present

## 2023-07-10 ENCOUNTER — Ambulatory Visit (INDEPENDENT_AMBULATORY_CARE_PROVIDER_SITE_OTHER): Payer: Medicaid Other | Admitting: Obstetrics and Gynecology

## 2023-07-10 ENCOUNTER — Other Ambulatory Visit: Payer: Medicaid Other

## 2023-07-10 VITALS — BP 96/63 | HR 96 | Wt 126.0 lb

## 2023-07-10 DIAGNOSIS — O0993 Supervision of high risk pregnancy, unspecified, third trimester: Secondary | ICD-10-CM

## 2023-07-10 DIAGNOSIS — O99283 Endocrine, nutritional and metabolic diseases complicating pregnancy, third trimester: Secondary | ICD-10-CM | POA: Diagnosis not present

## 2023-07-10 DIAGNOSIS — O0990 Supervision of high risk pregnancy, unspecified, unspecified trimester: Secondary | ICD-10-CM

## 2023-07-10 DIAGNOSIS — E0590 Thyrotoxicosis, unspecified without thyrotoxic crisis or storm: Secondary | ICD-10-CM

## 2023-07-10 DIAGNOSIS — Z1339 Encounter for screening examination for other mental health and behavioral disorders: Secondary | ICD-10-CM | POA: Diagnosis not present

## 2023-07-10 DIAGNOSIS — Z3A29 29 weeks gestation of pregnancy: Secondary | ICD-10-CM

## 2023-07-10 NOTE — Progress Notes (Signed)
   PRENATAL VISIT NOTE  Subjective:  Bemnet Trovato is a 28 y.o. [phone] at [redacted]w[redacted]d being seen today for ongoing prenatal care.  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  Patient doing well with no acute concerns today. She reports no complaints.  Contractions: Irritability. Vag. Bleeding: None.  Movement: Present. Denies leaking of fluid.   The following portions of the patient's history were reviewed and updated as appropriate: allergies, current medications, past family history, past medical history, past social history, past surgical history and problem list. Problem list updated.  Objective:   Vitals:   07/10/23 0944  BP: 96/63  Pulse: 96  Weight: 126 lb (57.2 kg)    Fetal Status: Fetal Heart Rate (bpm): 140 Fundal Height: 29 cm Movement: Present     General:  Alert, oriented and cooperative. Patient is in no acute distress.  Skin: Skin is warm and dry. No rash noted.   Cardiovascular: Normal heart rate noted  Respiratory: Normal respiratory effort, no problems with respiration noted  Abdomen: Soft, gravid, appropriate for gestational age.  Pain/Pressure: Absent     Pelvic: Cervical exam deferred        Extremities: Normal range of motion.  Edema: None  Mental Status:  Normal mood and affect. Normal behavior. Normal judgment and thought content.   Assessment and Plan:  Pregnancy: G5P1031 at [redacted]w[redacted]d  1. Supervision of high risk pregnancy, antepartum (Primary) Continue routine prenatal care Pt has hyperpigmented discoloration of the abdomen which had been itchy, but this has now receded. Pt will need to reschedule 2 hour GTT because she was not fasting  - Glucose Tolerance, 2  Hours w/1 Hour - RPR - CBC - HIV antibody (with reflex)  2. [redacted] weeks gestation of pregnancy  - Glucose Tolerance, 2 Hours w/1 Hour - RPR - CBC - HIV antibody (with reflex)  3. Hyperthyroidism affecting pregnancy in third trimester Pt sees endocrinology on Friday.  Her pre visit labs were normal. Pt has not taken methimazole for a month  Preterm labor symptoms and general obstetric precautions including but not limited to vaginal bleeding, contractions, leaking of fluid and fetal movement were reviewed in detail with the patient.  Please refer to After Visit Summary for other counseling recommendations.   Return in about 2 weeks (around 07/24/2023) for [facility], in person.   Mariel Aloe, MD Faculty Attending Center [facility]

## 2023-07-10 NOTE — Progress Notes (Signed)
 ROB, Pt ate this AM, needs to reschedule 2 hr GTT (07/12/23 @ 8 am).   Declines TDAP vaccine.   C/o brown rash all over stomach and nipples are dry and cracking, painful 3/10.

## 2023-07-11 ENCOUNTER — Ambulatory Visit

## 2023-07-11 LAB — T3, FREE: T3, Free: 2.3 pg/mL (ref 2.3–4.2)

## 2023-07-11 LAB — TSH: TSH: 0.54 m[IU]/L

## 2023-07-11 LAB — TRAB (TSH RECEPTOR BINDING ANTIBODY): TRAB: <1 IU/L (ref <=2.00)

## 2023-07-11 LAB — THYROID STIMULATING IMMUNOGLOBULIN: TSI: <89 %{baseline} (ref <140)

## 2023-07-11 LAB — T4, FREE: Free T4: 1 ng/dL (ref 0.8–1.8)

## 2023-07-12 ENCOUNTER — Other Ambulatory Visit

## 2023-07-12 ENCOUNTER — Ambulatory Visit (INDEPENDENT_AMBULATORY_CARE_PROVIDER_SITE_OTHER): Payer: Medicaid Other | Admitting: "Endocrinology

## 2023-07-12 ENCOUNTER — Encounter: Payer: Self-pay | Admitting: "Endocrinology

## 2023-07-12 VITALS — BP 110/60 | HR 106 | Ht 60.0 in | Wt 129.0 lb

## 2023-07-12 DIAGNOSIS — Z8639 Personal history of other endocrine, nutritional and metabolic disease: Secondary | ICD-10-CM

## 2023-07-12 DIAGNOSIS — Z3493 Encounter for supervision of normal pregnancy, unspecified, third trimester: Secondary | ICD-10-CM | POA: Diagnosis not present

## 2023-07-12 NOTE — Progress Notes (Signed)
 Outpatient Endocrinology Note Erin [place], MD  07/12/23   Erin Wilkins [birth year] [id number]  Referring Provider: Raelyn Mora, CNM Primary Care Provider: Darral Dash, DO Subjective  No chief complaint on file.   Assessment & Plan  Diagnoses and all orders for this visit:  History of hyperthyroidism -     TSH -     T3, free -     T4, free  Pregnant and not yet delivered in third trimester   Erin Wilkins is currently taking no thyroid medication. Diagnosed of hyperthyroidism around 03/2023. TSI/TRAb -ve Took methimazole? around XBJ'47 - WGN'56. Patient currently clinically and biochemically euthyroid. [redacted]  weeks pregnant currently.  Discussed the etiology for hyperthyroidism. Educated on thyroid axis.  Recommend the following: repeat labs in 6 weeks. Repeat labs sooner if symptoms of hyper or hypothyroidism develop.   I have reviewed current medications, nurse's notes, allergies, vital signs, past medical and surgical history, family medical history, and social history for this encounter. Counseled patient on symptoms, examination findings, lab findings, imaging results, treatment decisions and monitoring and prognosis. The patient understood the recommendations and agrees with the treatment plan. All questions regarding treatment plan were fully answered.   Return in about 6 weeks (around 08/23/2023) for visit + labs before next visit.   Erin [place], MD  07/12/23   I have reviewed current medications, nurse's notes, allergies, vital signs, past medical and surgical history, family medical history, and social history for this encounter. Counseled patient on symptoms, examination findings, lab findings, imaging results, treatment decisions and monitoring and prognosis. The patient understood the recommendations and agrees with the treatment plan. All questions regarding treatment plan were fully answered.   History of Present Illness Erin Wilkins is a 28 y.o. year old female who presents to our clinic with hyperthyroidism in first trimester diagnosed in 03/2023.    [redacted] weeks pregnant with and child  Diagnosed in Nov 2025 off hyperthyroidism  Stopped methimazole a month ago around 06/2023 and feels good   Symptoms suggestive of HYPOTHYROIDISM:  fatigue Yes weight gain Yes cold intolerance  No constipation  No  Symptoms suggestive of HYPERTHYROIDISM:  weight loss  No heat intolerance No hyperdefecation  No palpitations  No  Compressive symptoms:  dysphagia  No dysphonia  No positional dyspnea (especially with simultaneous arms elevation)  No  Smokes  No On biotin  No Personal history of head/neck surgery/irradiation  No  Adverse Drug Effects from Methimazole (MMI): none  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  Component     Latest Ref Rng 07/09/2023  T4,Free(Direct)     0.8 - 1.8 ng/dL 1.0   Triiodothyronine,Free,Serum     2.3 - 4.2 pg/mL 2.3   TSH     mIU/L 0.54   TRAB     <=2.00 IU/L <1.00   TSI     <140 % baseline <89      Physical Exam  BP 110/60 (BP Location: Left Arm, Patient Position: Sitting)   Pulse (!) 106   Ht 5' (1.524 m)   Wt 129 lb (58.5 kg)   LMP 12/16/2022 (Exact Date)   SpO2 99%   BMI 25.19 kg/m  Constitutional: well developed, well nourished Head: normocephalic, atraumatic, no exophthalmos Eyes: sclera anicteric, no redness Neck: no thyromegaly, no thyroid tenderness; no nodules palpated Lungs: normal respiratory effort Neurology: alert and oriented, no fine hand tremor Skin: dry, no appreciable rashes Musculoskeletal: no appreciable defects Psychiatric: normal mood and affect  Allergies Allergies  Allergen Reactions   Cherry Flavoring Agent (Non-Screening) Anaphylaxis   Keflex [Cephalexin] Hives   Metronidazole Nausea And Vomiting    To PO form (while pregnant)   Peanut (Diagnostic) Hives   Tylenol [Acetaminophen] Hives   Amoxicillin Hives and Rash     Rxn about 1 yr ago   Ancef [Cefazolin] Rash    Perioral rash   Ibuprofen Rash   Penicillins Hives and Rash    Has patient had a PCN reaction causing immediate rash, facial/tongue/throat swelling, SOB or lightheadedness with hypotension: No Has patient had a PCN reaction causing severe rash involving mucus membranes or skin necrosis: No Has patient had a PCN reaction that required hospitalization: No Has patient had a PCN reaction occurring within the last 10 years: Yes If all of the above answers are "NO", then may proceed with Cephalosporin use.     Current Medications Patient's Medications  New Prescriptions   No medications on file  Previous Medications   EPINEPHRINE (EPIPEN 2-PAK) 0.3 MG/0.3 ML IJ SOAJ INJECTION    Inject 0.3 mg into the muscle as needed for anaphylaxis.   HYDROCORTISONE CREAM 0.5 %    Apply 1 Application topically 2 (two) times daily. Apply to affected areas twice daily   PRENATAL VIT-FE PHOS-FA-OMEGA (VITAFOL GUMMIES) 3.33-0.333-34.8 MG CHEW    Chew 3 tablets by mouth daily before breakfast.  Modified Medications   No medications on file  Discontinued Medications   No medications on file    Past Medical History Past Medical History:  Diagnosis Date   Anemia    Birth control counseling 01/18/2011   Irregular uterine bleeding 04/24/2018   Lower abdominal pain 06/19/2021   Routine screening for STI (sexually transmitted infection) 06/09/2020   UTI (urinary tract infection)    Vaginal candidiasis 07/08/2019    Past Surgical History Past Surgical History:  Procedure Laterality Date   NO PAST SURGERIES      Family History family history includes Hypertension in her mother.  Social History Social History   Socioeconomic History   Marital status: Single    Spouse name: Boyfriend - Christian   Number of children: 0   Years of education: 12   Highest education level: Not on file  Occupational History   Occupation: Research officer, political party:  [employer]  Tobacco Use   Smoking status: Never    Passive exposure: Never   Smokeless tobacco: Never  Vaping Use   Vaping status: Never Used  Substance and Sexual Activity   Alcohol use: No   Drug use: No   Sexual activity: Not Currently  Other Topics Concern   Not on file  Social History Narrative   Ema Hebner lives with her mother and older sister in an apartment. She plans to return to living there following delivery until she is financially stable. FOB and boyfriend Ephriam Knuckles is involved and supportive. Britley graduated from highschool spring 2016 and was working at [employer] prior to pregnancy. Plans to return to work following delivery, though at a different establishment.   Social Drivers of Corporate investment banker Strain: Not on file  Food Insecurity: Not on file  Transportation Needs: Not on file  Physical Activity: Not on file  Stress: Not on file  Social Connections: Not on file  Intimate Partner Violence: Not on file    Laboratory Investigations Lab Results  Component Value Date   TSH 0.54 07/09/2023   TSH <0.010 (L) 03/21/2023   TSH 0.770  09/07/2019   FREET4 1.0 07/09/2023   FREET4 2.06 (H) 03/21/2023     Lab Results  Component Value Date   TSI <89 07/09/2023     No components found for: "TRAB"   No results found for: "CHOL" No results found for: "HDL" No results found for: "LDLCALC" No results found for: "TRIG" No results found for: "CHOLHDL" Lab Results  Component Value Date   CREATININE 0.51 (L) 05/02/2023   No results found for: "GFR"    Component Value Date/Time   NA 140 05/02/2023 1127   K 4.5 05/02/2023 1127   CL 108 (H) 05/02/2023 1127   CO2 17 (L) 05/02/2023 1127   GLUCOSE 78 05/02/2023 1127   GLUCOSE 85 03/21/2023 1427   BUN 10 05/02/2023 1127   CREATININE 0.51 (L) 05/02/2023 1127   CALCIUM 8.8 05/02/2023 1127   PROT 7.5 03/21/2023 1427   PROT 7.4 11/05/2017 1030   ALBUMIN 3.7 03/21/2023 1427   ALBUMIN 4.6 11/05/2017  1030   AST 23 03/21/2023 1427   ALT 25 03/21/2023 1427   ALKPHOS 44 03/21/2023 1427   BILITOT 0.6 03/21/2023 1427   BILITOT 0.4 11/05/2017 1030   GFRNONAA >60 03/21/2023 1427   GFRAA 96 09/07/2019 1354      Latest Ref Rng & Units 05/02/2023   11:27 AM 03/21/2023    2:27 PM 02/08/2023    1:10 AM  BMP  Glucose 70 - 99 mg/dL 78  85  88   BUN 6 - 20 mg/dL 10  15  12    Creatinine 0.57 - 1.00 mg/dL 1.61  0.96  0.45   BUN/Creat Ratio 9 - 23 20     Sodium 134 - 144 mmol/L 140  134  132   Potassium 3.5 - 5.2 mmol/L 4.5  3.1  3.1   Chloride 96 - 106 mmol/L 108  101  101   CO2 20 - 29 mmol/L 17  20  21    Calcium 8.7 - 10.2 mg/dL 8.8  9.3  8.5        Component Value Date/Time   WBC 10.4 03/28/2023 1553   WBC 11.9 (H) 03/21/2023 1427   RBC 3.28 (L) 03/28/2023 1553   RBC 4.26 03/21/2023 1427   HGB 10.2 (L) 03/28/2023 1553   HCT 31.4 (L) 03/28/2023 1553   PLT 211 03/28/2023 1553   MCV 96 03/28/2023 1553   MCH 31.1 03/28/2023 1553   MCH 30.8 03/21/2023 1427   MCHC 32.5 03/28/2023 1553   MCHC 36.1 (H) 03/21/2023 1427   RDW 12.6 03/28/2023 1553   LYMPHSABS 2.3 03/28/2023 1553   MONOABS 0.5 03/11/2020 1730   EOSABS 0.1 03/28/2023 1553   BASOSABS 0.0 03/28/2023 1553      Parts of this note may have been dictated using voice recognition software. There may be variances in spelling and vocabulary which are unintentional. Not all errors are proofread. Please notify the Thereasa Parkin if any discrepancies are noted or if the meaning of any statement is not clear.

## 2023-07-17 ENCOUNTER — Ambulatory Visit (INDEPENDENT_AMBULATORY_CARE_PROVIDER_SITE_OTHER): Admitting: Student

## 2023-07-17 VITALS — BP 100/70 | HR 100 | Temp 98.1°F | Wt 130.0 lb

## 2023-07-17 DIAGNOSIS — R21 Rash and other nonspecific skin eruption: Secondary | ICD-10-CM | POA: Diagnosis not present

## 2023-07-17 DIAGNOSIS — L299 Pruritus, unspecified: Secondary | ICD-10-CM

## 2023-07-17 MED ORDER — CETIRIZINE HCL 10 MG PO TABS: 10.0000 mg | ORAL_TABLET | Freq: Every day | ORAL | 0 refills | Status: DC

## 2023-07-17 MED ORDER — TRIAMCINOLONE ACETONIDE 0.5 % EX OINT: 1.0000 | TOPICAL_OINTMENT | Freq: Two times a day (BID) | CUTANEOUS | 0 refills | Status: DC

## 2023-07-17 NOTE — Assessment & Plan Note (Signed)
 2 separate rashes today.  Dry skin in stray noted around the abdomen look benign.  Will prescribe course of steroid ointment to be applied twice a day.  Encourage patient to use Vaseline after showering.  Told her to avoid new soaps or laundry detergents.  Papules that are present on abdomen look most consistent with mild polymorphic eruption of pregnancy.  Will also treat with steroids and encourage patient to try Zyrtec 10 mg daily to help with pruritus.

## 2023-07-17 NOTE — Patient Instructions (Signed)
 I am prescribing a steroid ointment you can apply to the itchy spots and dry skin twice a day.  You can put Vaseline on top.  Please avoid changing laundry detergents or soaps.  You should also take a zyrtec once a day to help with the itchiness.  If the rash starts to look worse or spreads please come back to be seen

## 2023-07-17 NOTE — Progress Notes (Signed)
    SUBJECTIVE:   CHIEF COMPLAINT / HPI:   Erin Wilkins is a 28 y.o. female  presenting for skin rash on abdomen. She changed laundry soap and soap recently. She was mixing different oils to put on her skin.  The dry skin has been present for several weeks.  She also also noticed little red spots that started around her bellybutton and then resolved and then she just got a new red spot on the left side of her bellybutton that is itchy.  She denies having itchiness on the palms or soles of her feet  PERTINENT  PMH / PSH: Reviewed and updated   OBJECTIVE:   BP 100/70   Pulse 100   Temp 98.1 F (36.7 C)   Wt 130 lb (59 kg)   LMP 12/16/2022 (Exact Date)   SpO2 96%   BMI 25.39 kg/m   well-appearing, no acute distress Cardio: Regular rate, regular rhythm, no murmurs on exam. Pulm: Clear, no wheezing, no crackles. No increased work of breathing  Skin: 2-3 small <0.5 cm papules present at 2 o'clock position on abdomen. No signs of blistering, excoriations, or secondary infection.  Dry skin noted with striae along the underside of her breast and under her belly extending into her thighs.  This is not itchy no excoriations present.       Fetal heart tones appropriate for GA.      07/10/2023    9:40 AM 03/21/2023   11:23 AM 02/19/2023    2:48 PM  PHQ9 SCORE ONLY  PHQ-9 Total Score 3 5 0      ASSESSMENT/PLAN:   Rash 2 separate rashes today.  Dry skin in stray noted around the abdomen look benign.  Will prescribe course of steroid ointment to be applied twice a day.  Encourage patient to use Vaseline after showering.  Told her to avoid new soaps or laundry detergents.  Papules that are present on abdomen look most consistent with mild polymorphic eruption of pregnancy.  Will also treat with steroids and encourage patient to try Zyrtec 10 mg daily to help with pruritus.     Glendale Chard, DO [facility]

## 2023-07-19 ENCOUNTER — Other Ambulatory Visit

## 2023-07-29 ENCOUNTER — Telehealth: Payer: Self-pay

## 2023-07-29 NOTE — Telephone Encounter (Signed)
 TC to pt to discuss pt having sharp pains when walking in the vaginal area like "lightening crotch". Does not happen as long as she is sitting just hits her when she is walking and moving around. Encouraged pt it could be uterine growth, ligament pain. Does not sound like contractions. No other complaints or symptoms. Advised pt that she can try heat and tylenol to see if this relieves her symptoms. Informed pt to let us know if this does not get better.

## 2023-07-31 ENCOUNTER — Ambulatory Visit

## 2023-07-31 DIAGNOSIS — Z111 Encounter for screening for respiratory tuberculosis: Secondary | ICD-10-CM

## 2023-07-31 NOTE — Progress Notes (Signed)
 Patient is here for a PPD placement.  PPD placed in left forearm @ 1000 am.  Patient states that her employer will read the test on Friday.   Veronda Prude, RN

## 2023-08-01 ENCOUNTER — Ambulatory Visit

## 2023-08-01 NOTE — Progress Notes (Deleted)
    SUBJECTIVE:   CHIEF COMPLAINT / HPI:   Umbilical rash in pregnancy Seen 07/17/2023 for similar concern, prescribed topical steroid ointment and recommended dry skin regimen.  Thought to be secondary to atopic eruption versus newly polymorphic eruption of pregnancy.  Currently 32 weeks and 4 days gestation[facility]  Has not seen OB/GYN since onset of symptoms.  PERTINENT  PMH / PSH: ***  OBJECTIVE:   LMP 12/16/2022 (Exact Date)  ***  General: NAD, pleasant, able to participate in exam Cardiac: RRR, no murmurs. Respiratory: CTAB, normal effort, No wheezes, rales or rhonchi Abdomen: Bowel sounds present, nontender, nondistended Extremities: no edema or cyanosis. Skin: warm and dry, no rashes noted Neuro: alert, no obvious focal deficits Psych: Normal affect and mood  ASSESSMENT/PLAN:   No problem-specific Assessment & Plan notes found for this encounter.     Dr. Elberta Fortis, DO [place] [facility]    {    This will disappear when note is signed, click to select method of visit    :1}

## 2023-08-07 ENCOUNTER — Ambulatory Visit: Admitting: Obstetrics and Gynecology

## 2023-08-07 VITALS — BP 103/68 | HR 87 | Wt 132.0 lb

## 2023-08-07 DIAGNOSIS — O99713 Diseases of the skin and subcutaneous tissue complicating pregnancy, third trimester: Secondary | ICD-10-CM | POA: Diagnosis not present

## 2023-08-07 DIAGNOSIS — Z3A33 33 weeks gestation of pregnancy: Secondary | ICD-10-CM

## 2023-08-07 DIAGNOSIS — L299 Pruritus, unspecified: Secondary | ICD-10-CM

## 2023-08-07 DIAGNOSIS — O99283 Endocrine, nutritional and metabolic diseases complicating pregnancy, third trimester: Secondary | ICD-10-CM | POA: Diagnosis not present

## 2023-08-07 DIAGNOSIS — O0990 Supervision of high risk pregnancy, unspecified, unspecified trimester: Secondary | ICD-10-CM

## 2023-08-07 DIAGNOSIS — E0590 Thyrotoxicosis, unspecified without thyrotoxic crisis or storm: Secondary | ICD-10-CM | POA: Diagnosis not present

## 2023-08-07 MED ORDER — HYDROXYZINE HCL 25 MG PO TABS: 25.0000 mg | ORAL_TABLET | Freq: Four times a day (QID) | ORAL | 2 refills | Status: DC | PRN

## 2023-08-07 MED ORDER — ONDANSETRON 4 MG PO TBDP: 4.0000 mg | ORAL_TABLET | Freq: Four times a day (QID) | ORAL | 0 refills | Status: DC | PRN

## 2023-08-07 NOTE — Progress Notes (Signed)
   PRENATAL VISIT NOTE  Subjective:  Erin Wilkins is a 28 y.o. [phone] at [redacted]w[redacted]d being seen today for ongoing prenatal care.  She is currently monitored for the following issues for this high-risk pregnancy and has Nausea & vomiting; Supervision of high risk pregnancy, antepartum; Vaginal trauma; Groin pain, chronic, left; Epigastric pain; Anaphylactic syndrome; Orthostatic hypotension; Hyperthyroidism affecting pregnancy in third trimester; and Rash on their problem list.  Patient doing well with no acute concerns today. She reports no complaints.  Contractions: Irregular. Vag. Bleeding: None.  Movement: Present. Denies leaking of fluid.   The following portions of the patient's history were reviewed and updated as appropriate: allergies, current medications, past family history, past medical history, past social history, past surgical history and problem list. Problem list updated.  Objective:   Vitals:   08/07/23 1548  BP: 103/68  Pulse: 87  Weight: 132 lb (59.9 kg)    Fetal Status: Fetal Heart Rate (bpm): 140 Fundal Height: 33 cm Movement: Present     General:  Alert, oriented and cooperative. Patient is in no acute distress.  Skin: Skin is warm and dry. No rash noted.   Cardiovascular: Normal heart rate noted  Respiratory: Normal respiratory effort, no problems with respiration noted  Abdomen: Soft, gravid, appropriate for gestational age.  Pain/Pressure: Present     Pelvic: Cervical exam deferred        Extremities: Normal range of motion.     Mental Status:  Normal mood and affect. Normal behavior. Normal judgment and thought content.   Assessment and Plan:  Pregnancy: G5P1031 at [redacted]w[redacted]d  1. [redacted] weeks gestation of pregnancy (Primary)   2. Hyperthyroidism affecting pregnancy in third trimester Pt not on methimazole, has follow up labs and visit with endo on 4/17 and 4/24  3. Supervision of high risk pregnancy, antepartum Continue routine prenatal care Pt will try to  come in for lab only 2 hour GTT Will premedicate with zofran due to nausea experienced last time  - ondansetron (ZOFRAN-ODT) 4 MG disintegrating tablet; Take 1 tablet (4 mg total) by mouth every 6 (six) hours as needed for nausea.  Dispense: 3 tablet; Refill: 0  4. Pruritus of pregnancy in third trimester Pt has a pruritic rash on abdomen that is waxing and waning, will treat symptoms with atarax  - hydrOXYzine (ATARAX) 25 MG tablet; Take 1 tablet (25 mg total) by mouth every 6 (six) hours as needed for itching.  Dispense: 30 tablet; Refill: 2  Preterm labor symptoms and general obstetric precautions including but not limited to vaginal bleeding, contractions, leaking of fluid and fetal movement were reviewed in detail with the patient.  Please refer to After Visit Summary for other counseling recommendations.   Return in about 2 weeks (around 08/21/2023) for [facility], in person.   Mariel Aloe, MD Faculty Attending [facility]

## 2023-08-07 NOTE — Progress Notes (Signed)
 Pt has rash on abdomen - has been using kenalog some.   Please discuss GTT testing - pt has not been able to complete test.

## 2023-08-09 ENCOUNTER — Other Ambulatory Visit: Payer: Self-pay

## 2023-08-13 ENCOUNTER — Inpatient Hospital Stay (HOSPITAL_COMMUNITY)
Admission: AD | Admit: 2023-08-13 | Discharge: 2023-08-13 | Disposition: A | Discharge status: Home / Self Care | Attending: Obstetrics and Gynecology | Admitting: Obstetrics and Gynecology

## 2023-08-13 ENCOUNTER — Encounter (HOSPITAL_COMMUNITY): Payer: Self-pay | Admitting: Obstetrics and Gynecology

## 2023-08-13 DIAGNOSIS — Z3A34 34 weeks gestation of pregnancy: Secondary | ICD-10-CM | POA: Diagnosis not present

## 2023-08-13 DIAGNOSIS — R143 Flatulence: Secondary | ICD-10-CM | POA: Insufficient documentation

## 2023-08-13 DIAGNOSIS — O0990 Supervision of high risk pregnancy, unspecified, unspecified trimester: Secondary | ICD-10-CM

## 2023-08-13 DIAGNOSIS — R142 Eructation: Secondary | ICD-10-CM | POA: Insufficient documentation

## 2023-08-13 DIAGNOSIS — R109 Unspecified abdominal pain: Secondary | ICD-10-CM | POA: Diagnosis present

## 2023-08-13 DIAGNOSIS — O99613 Diseases of the digestive system complicating pregnancy, third trimester: Secondary | ICD-10-CM | POA: Insufficient documentation

## 2023-08-13 DIAGNOSIS — O26893 Other specified pregnancy related conditions, third trimester: Secondary | ICD-10-CM | POA: Diagnosis not present

## 2023-08-13 DIAGNOSIS — Z3689 Encounter for other specified antenatal screening: Secondary | ICD-10-CM | POA: Diagnosis not present

## 2023-08-13 DIAGNOSIS — R140 Abdominal distension (gaseous): Secondary | ICD-10-CM | POA: Insufficient documentation

## 2023-08-13 DIAGNOSIS — R197 Diarrhea, unspecified: Secondary | ICD-10-CM | POA: Diagnosis present

## 2023-08-13 DIAGNOSIS — O0993 Supervision of high risk pregnancy, unspecified, third trimester: Secondary | ICD-10-CM | POA: Insufficient documentation

## 2023-08-13 HISTORY — DX: Hypothyroidism, unspecified: E03.9

## 2023-08-13 LAB — URINALYSIS, ROUTINE W REFLEX MICROSCOPIC
Bilirubin Urine: NEGATIVE
Glucose, UA: NEGATIVE mg/dL
Hgb urine dipstick: NEGATIVE
Ketones, ur: NEGATIVE mg/dL
Nitrite: NEGATIVE
Protein, ur: 30 mg/dL — AB
Specific Gravity, Urine: 1.024 (ref 1.005–1.030)
pH: 5 (ref 5.0–8.0)

## 2023-08-13 NOTE — MAU Provider Note (Signed)
 Chief Complaint:  Diarrhea and Abdominal Pain   HPI      Erin Wilkins is a 28 y.o. [phone] at [redacted]w[redacted]d who presents to maternity admissions reporting she has had some watery stools over the last few weeks but this is not abnormal for her. Her main complaint is that she states when she burps " it tastes like eggs". She denies heartburn or any reflux and offers no GU compalints   Denies any abdominal pain, N/V .Offers  no OB C/o LOF, VB, CTX and reports good FM's.   Pregnancy Course: [facility]  Past Medical History:  Diagnosis Date   Anemia    Birth control counseling 01/18/2011   Hypothyroidism    Irregular uterine bleeding 04/24/2018   Lower abdominal pain 06/19/2021   Routine screening for STI (sexually transmitted infection) 06/09/2020   UTI (urinary tract infection)    Vaginal candidiasis 07/08/2019   OB History  Gravida Para Term Preterm AB Living  5 1 1  3 1   SAB IAB Ectopic Multiple Live Births  1 2  0 1    # Outcome Date GA Lbr Len/2nd Weight Sex Type Anes PTL Lv  5 Current           4 IAB 12/06/19          3 IAB 12/2019          2 Term 03/10/15 [redacted]w[redacted]d 18:39 / 02:00 3265 g M Vag-Spont EPI  LIV  1 SAB            Past Surgical History:  Procedure Laterality Date   NO PAST SURGERIES     Family History  Problem Relation Age of Onset   Hypertension Mother    Social History   Tobacco Use   Smoking status: Never    Passive exposure: Never   Smokeless tobacco: Never  Vaping Use   Vaping status: Never Used  Substance Use Topics   Alcohol use: No   Drug use: No   Allergies  Allergen Reactions   Cherry Flavoring Agent (Non-Screening) Anaphylaxis   Keflex [Cephalexin] Hives   Metronidazole Nausea And Vomiting    To PO form (while pregnant)   Peanut (Diagnostic) Hives   Tylenol [Acetaminophen] Hives   Amoxicillin Hives and Rash    Rxn about 1 yr ago   Ancef [Cefazolin] Rash    Perioral rash   Ibuprofen Rash   Penicillins Hives and Rash    Has patient had  a PCN reaction causing immediate rash, facial/tongue/throat swelling, SOB or lightheadedness with hypotension: No Has patient had a PCN reaction causing severe rash involving mucus membranes or skin necrosis: No Has patient had a PCN reaction that required hospitalization: No Has patient had a PCN reaction occurring within the last 10 years: Yes If all of the above answers are "NO", then may proceed with Cephalosporin use.    Medications Prior to Admission  Medication Sig Dispense Refill Last Dose/Taking   Prenatal Vit-Fe Phos-FA-Omega (VITAFOL GUMMIES) 3.33-0.333-34.8 MG CHEW Chew 3 tablets by mouth daily before breakfast. 90 tablet 11 08/13/2023   cetirizine (ZYRTEC ALLERGY) 10 MG tablet Take 1 tablet (10 mg total) by mouth daily. 90 tablet 0    EPINEPHrine (EPIPEN 2-PAK) 0.3 mg/0.3 mL IJ SOAJ injection Inject 0.3 mg into the muscle as needed for anaphylaxis. 1 each 1    hydrocortisone cream 0.5 % Apply 1 Application topically 2 (two) times daily. Apply to affected areas twice daily 56 g 1    hydrOXYzine (  ATARAX) 25 MG tablet Take 1 tablet (25 mg total) by mouth every 6 (six) hours as needed for itching. 30 tablet 2    ondansetron (ZOFRAN-ODT) 4 MG disintegrating tablet Take 1 tablet (4 mg total) by mouth every 6 (six) hours as needed for nausea. 3 tablet 0    triamcinolone ointment (KENALOG) 0.5 % Apply 1 Application topically 2 (two) times daily. 30 g 0     I have reviewed patient's Past Medical Hx, Surgical Hx, Family Hx, Social Hx, medications and allergies.   ROS  Pertinent items noted in HPI and remainder of comprehensive ROS otherwise negative.   PHYSICAL EXAM  Patient Vitals for the past 24 hrs:  BP Temp Pulse Resp SpO2 Height Weight  08/13/23 1936 112/66 -- -- -- -- -- --  08/13/23 1932 -- 98 F (36.7 C) 92 17 100 % 5\' 1"  (1.549 m) 60.8 kg    Constitutional: Well-developed, well-nourished female in no acute distress.  Cardiovascular: normal rate & rhythm, warm and  well-perfused Respiratory: normal effort, no problems with respiration noted GI: Abd soft, non-tender, gravid MS: Extremities nontender, no edema, normal ROM Neurologic: Alert and oriented x 4.  GU: no CVA tenderness Pelvic: Deferred     Fetal Tracing: @ 2030 Cat 1, reactive Baseline: 135 Variability:moderate  Accelerations: present Decelerations: absent Toco: no ctx   Labs: Results for orders placed or performed during the hospital encounter of 08/13/23 (from the past 24 hours)  Urinalysis, Routine w reflex microscopic -Urine, Clean Catch     Status: Abnormal   Collection Time: 08/13/23  8:09 PM  Result Value Ref Range   Color, Urine YELLOW YELLOW   APPearance HAZY (A) CLEAR   Specific Gravity, Urine 1.024 1.005 - 1.030   pH 5.0 5.0 - 8.0   Glucose, UA NEGATIVE NEGATIVE mg/dL   Hgb urine dipstick NEGATIVE NEGATIVE   Bilirubin Urine NEGATIVE NEGATIVE   Ketones, ur NEGATIVE NEGATIVE mg/dL   Protein, ur 30 (A) NEGATIVE mg/dL   Nitrite NEGATIVE NEGATIVE   Leukocytes,Ua LARGE (A) NEGATIVE   RBC / HPF 0-5 0 - 5 RBC/hpf   WBC, UA 11-20 0 - 5 WBC/hpf   Bacteria, UA FEW (A) NONE SEEN   Squamous Epithelial / HPF 6-10 0 - 5 /HPF   Mucus PRESENT    OB Urine culture sent. Asymptomatic      MDM & MAU COURSE  MDM:  Moderate   I have reviewed the patient chart and performed the physical exam . I have ordered & interpreted the lab results and reviewed and interpreted the NST Medications ordered as stated below.  A/P as described below.  Counseling and education provided and patient agreeable  with plan as described below. Verbalized understanding.    MAU Course:    Orders Placed This Encounter  Procedures   Culture, OB Urine    Standing Status:   Standing    Number of Occurrences:   1   Urinalysis, Routine w reflex microscopic -Urine, Clean Catch    Standing Status:   Standing    Number of Occurrences:   1    Specimen Source:   Urine, Clean Catch [76]   Discharge  patient Discharge disposition: 01-Home or Self Care; Discharge patient date: 08/13/2023    Standing Status:   Standing    Number of Occurrences:   1    Discharge disposition:   01-Home or Self Care [1]    Discharge patient date:   08/13/2023  ASSESSMENT    1. Supervision of high risk pregnancy, antepartum  2. Flatulence/gas pain/belching (Primary)  3. [redacted] weeks gestation of pregnancy  4. NST (non-stress test) reactive    PLAN  Discharge home in stable condition with return precautions.   See AVS for full description of information given to the patient including both verbal and written. Patient verbalized understanding and agrees with the plan as described above.  Future Appointments  Date Time Provider Department Center  08/14/2023  8:00 AM [facility] None  08/21/2023  2:50 PM Dunn, Gillian Scarce, MD [facility] None  08/22/2023  8:00 AM [facility] None  08/29/2023  8:00 AM Altamese , MD [facility] None      Follow-up Information     [facility] Follow up.   Specialty: Obstetrics and Gynecology Why: If symptoms worsen or fail to resolve, As scheduled for ongoing prenatal care Contact information: [address], [address] [place] [address] [phone]                Allergies as of 08/13/2023       Reactions   Cherry Flavoring Agent (non-screening) Anaphylaxis   Keflex [cephalexin] Hives   Metronidazole Nausea And Vomiting   To PO form (while pregnant)   Peanut (diagnostic) Hives   Tylenol [acetaminophen] Hives   Amoxicillin Hives, Rash   Rxn about 1 yr ago   Ancef [cefazolin] Rash   Perioral rash   Ibuprofen Rash   Penicillins Hives, Rash   Has patient had a PCN reaction causing immediate rash, facial/tongue/throat swelling, SOB or lightheadedness with hypotension: No Has patient had a PCN reaction causing severe rash involving mucus membranes or skin necrosis: No Has  patient had a PCN reaction that required hospitalization: No Has patient had a PCN reaction occurring within the last 10 years: Yes If all of the above answers are "NO", then may proceed with Cephalosporin use.        Medication List     TAKE these medications    cetirizine 10 MG tablet Commonly known as: ZyrTEC Allergy Take 1 tablet (10 mg total) by mouth daily.   EPINEPHrine 0.3 mg/0.3 mL Soaj injection Commonly known as: EpiPen 2-Pak Inject 0.3 mg into the muscle as needed for anaphylaxis.   hydrocortisone cream 0.5 % Apply 1 Application topically 2 (two) times daily. Apply to affected areas twice daily   hydrOXYzine 25 MG tablet Commonly known as: ATARAX Take 1 tablet (25 mg total) by mouth every 6 (six) hours as needed for itching.   ondansetron 4 MG disintegrating tablet Commonly known as: ZOFRAN-ODT Take 1 tablet (4 mg total) by mouth every 6 (six) hours as needed for nausea.   triamcinolone ointment 0.5 % Commonly known as: KENALOG Apply 1 Application topically 2 (two) times daily.   Vitafol Gummies 3.33-0.333-34.8 MG Chew Chew 3 tablets by mouth daily before breakfast.        Marcell Barlow, MSN, [facility], Center for [employer]

## 2023-08-13 NOTE — Discharge Instructions (Signed)

## 2023-08-13 NOTE — MAU Note (Signed)
.  Erin Wilkins is a 28 y.o. at [redacted]w[redacted]d here in MAU reporting having 3 watery stools today. She has some R side pain today which improves after she has a BM. Reports loose BMs for several wks but today watery. Denies VB or LOF and reports good FM. No n/v. Reports when she burps she has an egg taste and she has not eaten any eggs. No pain currently but pain was a 9 before she had a BM  LMP: n/a Onset of complaint: early morning Pain score: 0 Vitals:   08/13/23 1932 08/13/23 1936  BP:  112/66  Pulse: 92   Resp: 17   Temp: 98 F (36.7 C)   SpO2: 100%      FHT: 152  Lab orders placed from triage: u/a

## 2023-08-14 ENCOUNTER — Other Ambulatory Visit

## 2023-08-14 DIAGNOSIS — Z3A34 34 weeks gestation of pregnancy: Secondary | ICD-10-CM

## 2023-08-14 LAB — CULTURE, OB URINE: Culture: NO GROWTH

## 2023-08-15 LAB — CBC
Hematocrit: 27.3 % — ABNORMAL LOW (ref 34.0–46.6)
Hemoglobin: 8.6 g/dL — ABNORMAL LOW (ref 11.1–15.9)
MCH: 28.2 pg (ref 26.6–33.0)
MCHC: 31.5 g/dL (ref 31.5–35.7)
MCV: 90 fL (ref 79–97)
Platelets: 304 10*3/uL (ref 150–450)
RBC: 3.05 x10E6/uL — ABNORMAL LOW (ref 3.77–5.28)
RDW: 13.3 % (ref 11.7–15.4)
WBC: 10.8 10*3/uL (ref 3.4–10.8)

## 2023-08-15 LAB — GLUCOSE TOLERANCE, 2 HOURS W/ 1HR
Glucose, 1 hour: 162 mg/dL (ref 70–179)
Glucose, 2 hour: 146 mg/dL (ref 70–152)
Glucose, Fasting: 76 mg/dL (ref 70–91)

## 2023-08-15 LAB — RPR: RPR Ser Ql: NONREACTIVE

## 2023-08-15 LAB — HIV ANTIBODY (ROUTINE TESTING W REFLEX): HIV Screen 4th Generation wRfx: NONREACTIVE

## 2023-08-19 ENCOUNTER — Inpatient Hospital Stay (HOSPITAL_COMMUNITY)
Admission: AD | Admit: 2023-08-19 | Discharge: 2023-08-19 | Disposition: A | Discharge status: Home / Self Care | Payer: Self-pay | Attending: Obstetrics & Gynecology | Admitting: Obstetrics & Gynecology

## 2023-08-19 DIAGNOSIS — Z711 Person with feared health complaint in whom no diagnosis is made: Secondary | ICD-10-CM

## 2023-08-19 DIAGNOSIS — Z3A35 35 weeks gestation of pregnancy: Secondary | ICD-10-CM | POA: Diagnosis not present

## 2023-08-19 DIAGNOSIS — M7989 Other specified soft tissue disorders: Secondary | ICD-10-CM | POA: Insufficient documentation

## 2023-08-19 DIAGNOSIS — O99893 Other specified diseases and conditions complicating puerperium: Secondary | ICD-10-CM | POA: Insufficient documentation

## 2023-08-19 DIAGNOSIS — R609 Edema, unspecified: Secondary | ICD-10-CM

## 2023-08-19 DIAGNOSIS — R202 Paresthesia of skin: Secondary | ICD-10-CM | POA: Insufficient documentation

## 2023-08-19 NOTE — MAU Provider Note (Cosign Needed Addendum)
 Chief Complaint:  Arm Swelling   HPI   Erin Wilkins is a 28 y.o. [phone] at [redacted]w[redacted]d who presents to maternity admissions reporting swelling and tingling to the right hand.   Pt reports that she woke up this morning and noticed some swelling and tingling in her right arm and hand. She has noticed that some of the swelling has gone away since this morning and the tingling comes and goes. She denies any decrease in strength or numbness. Negative Phalen's or Tinel's sign. Denies any swelling anywhere else. Denies sleeping on that arm.She denies any pain. Reports that she did have a very large meal last night that was very salty. Reports that she did have heart burn after eating that meal.   Pregnancy Course: receiving prenatal care. No other concerns  Past Medical History:  Diagnosis Date   Anemia    Birth control counseling 01/18/2011   Hypothyroidism    Irregular uterine bleeding 04/24/2018   Lower abdominal pain 06/19/2021   Routine screening for STI (sexually transmitted infection) 06/09/2020   UTI (urinary tract infection)    Vaginal candidiasis 07/08/2019   OB History  Gravida Para Term Preterm AB Living  5 1 1  3 1   SAB IAB Ectopic Multiple Live Births  1 2  0 1    # Outcome Date GA Lbr Len/2nd Weight Sex Type Anes PTL Lv  5 Current           4 IAB 12/06/19          3 IAB 12/2019          2 Term 03/10/15 [redacted]w[redacted]d 18:39 / 02:00 3265 g M Vag-Spont EPI  LIV  1 SAB            Past Surgical History:  Procedure Laterality Date   NO PAST SURGERIES     Family History  Problem Relation Age of Onset   Hypertension Mother    Social History   Tobacco Use   Smoking status: Never    Passive exposure: Never   Smokeless tobacco: Never  Vaping Use   Vaping status: Never Used  Substance Use Topics   Alcohol use: No   Drug use: No   Allergies  Allergen Reactions   Cherry Flavoring Agent (Non-Screening) Anaphylaxis   Keflex [Cephalexin] Hives   Metronidazole Nausea And  Vomiting    To PO form (while pregnant)   Peanut (Diagnostic) Hives   Tylenol [Acetaminophen] Hives   Amoxicillin Hives and Rash    Rxn about 1 yr ago   Ancef [Cefazolin] Rash    Perioral rash   Ibuprofen Rash   Penicillins Hives and Rash    Has patient had a PCN reaction causing immediate rash, facial/tongue/throat swelling, SOB or lightheadedness with hypotension: No Has patient had a PCN reaction causing severe rash involving mucus membranes or skin necrosis: No Has patient had a PCN reaction that required hospitalization: No Has patient had a PCN reaction occurring within the last 10 years: Yes If all of the above answers are "NO", then may proceed with Cephalosporin use.    No medications prior to admission.    I have reviewed patient's Past Medical Hx, Surgical Hx, Family Hx, Social Hx, medications and allergies.   ROS  Pertinent items noted in HPI and remainder of comprehensive ROS otherwise negative.   PHYSICAL EXAM  Patient Vitals for the past 24 hrs:  BP Temp Pulse Resp Height Weight  08/19/23 1331 110/68 98.4 F (36.9 C) 88  18 5\' 1"  (1.549 m) 61.2 kg    Constitutional: Well-developed, well-nourished female in no acute distress.  Cardiovascular: normal rate & rhythm, warm and well-perfused Respiratory: normal effort, no problems with respiration noted MS: Extremities nontender, minimal edema to right lower arm and hand, normal ROM, normal strength  Neurologic: Alert and oriented x 4.       Labs: No results found for this or any previous visit (from the past 24 hours).  Imaging:  No results found.  MDM & MAU COURSE  MDM:  MAU Course: Orders Placed This Encounter  Procedures   Discharge patient Discharge disposition: 01-Home or Self Care; Discharge patient date: 08/19/2023   No orders of the defined types were placed in this encounter.   ASSESSMENT   1. Physically well but worried   2. Swelling   3. [redacted] weeks gestation of pregnancy     PLAN   Discharge home in stable condition with return precautions.   - Educated the patient on proper hydration and how increase salt intake can cause swelling. - Educated on signs and symptoms of carpal tunnel - Educated on if pain starts to occur or she loses strength to come back on contact her doctor.    Follow-up Information     [facility] Follow up.   Why: follow up, as scheduled Contact information: [address] [place]  [address] [phone]                Allergies as of 08/19/2023       Reactions   Cherry Flavoring Agent (non-screening) Anaphylaxis   Keflex [cephalexin] Hives   Metronidazole Nausea And Vomiting   To PO form (while pregnant)   Peanut (diagnostic) Hives   Tylenol [acetaminophen] Hives   Amoxicillin Hives, Rash   Rxn about 1 yr ago   Ancef [cefazolin] Rash   Perioral rash   Ibuprofen Rash   Penicillins Hives, Rash   Has patient had a PCN reaction causing immediate rash, facial/tongue/throat swelling, SOB or lightheadedness with hypotension: No Has patient had a PCN reaction causing severe rash involving mucus membranes or skin necrosis: No Has patient had a PCN reaction that required hospitalization: No Has patient had a PCN reaction occurring within the last 10 years: Yes If all of the above answers are "NO", then may proceed with Cephalosporin use.       Jersey Espinoza Maurie Southern) Marlys Singh, MSN, CNM  Center for [facility]  08/19/2023 6:30 PM

## 2023-08-19 NOTE — MAU Note (Signed)
.  [name] is a 28 y.o. at [redacted]w[redacted]d here in MAU reporting: woke up today with right arm swollen with a little bit of tingling. Does not sleep on that side. No headache, or visual changes. Good fetal movement. no ctx reported.   LMP:  Onset of complaint: this morning. Pain score: 0 Vitals:   08/19/23 1331  BP: 110/68  Pulse: 88  Resp: 18  Temp: 98.4 F (36.9 C)     FHT: 134  Lab orders placed from triage:

## 2023-08-21 ENCOUNTER — Other Ambulatory Visit (HOSPITAL_COMMUNITY)
Admission: RE | Admit: 2023-08-21 | Discharge: 2023-08-21 | Disposition: A | Discharge status: Home / Self Care | Source: Ambulatory Visit | Attending: Obstetrics and Gynecology | Admitting: Obstetrics and Gynecology

## 2023-08-21 ENCOUNTER — Ambulatory Visit: Admitting: Obstetrics and Gynecology

## 2023-08-21 VITALS — BP 109/71 | HR 103 | Wt 138.0 lb

## 2023-08-21 DIAGNOSIS — Z3A35 35 weeks gestation of pregnancy: Secondary | ICD-10-CM | POA: Insufficient documentation

## 2023-08-21 DIAGNOSIS — O99283 Endocrine, nutritional and metabolic diseases complicating pregnancy, third trimester: Secondary | ICD-10-CM | POA: Diagnosis not present

## 2023-08-21 DIAGNOSIS — O0990 Supervision of high risk pregnancy, unspecified, unspecified trimester: Secondary | ICD-10-CM | POA: Insufficient documentation

## 2023-08-21 DIAGNOSIS — N898 Other specified noninflammatory disorders of vagina: Secondary | ICD-10-CM | POA: Insufficient documentation

## 2023-08-21 DIAGNOSIS — E0590 Thyrotoxicosis, unspecified without thyrotoxic crisis or storm: Secondary | ICD-10-CM

## 2023-08-21 DIAGNOSIS — O99013 Anemia complicating pregnancy, third trimester: Secondary | ICD-10-CM | POA: Diagnosis not present

## 2023-08-21 NOTE — Progress Notes (Deleted)
 Chief Complaint  Patient presents with   Routine Prenatal Visit    Subjective:   Erin Wilkins is a 28 y.o. G5P1031 at [redacted]w[redacted]d by {Ob dating:14516} being seen today for her first obstetrical visit.  Her obstetrical history is significant for {ob risk factors:10154}. Patient {does/does not:19097} intend to breast feed. Pregnancy history fully reviewed.  Patient reports {sx:14538}.  HISTORY: OB History  Gravida Para Term Preterm AB Living  5 1 1  0 3 1  SAB IAB Ectopic Multiple Live Births  1 2 0 0 1    # Outcome Date GA Lbr Len/2nd Weight Sex Type Anes PTL Lv  5 Current           4 IAB 12/06/19          3 IAB 12/2019          2 Term 03/10/15 [redacted]w[redacted]d 18:39 / 02:00 7 lb 3.2 oz (3.265 kg) M Vag-Spont EPI  LIV     Name: Bastyr,BOY Sundae     Apgar1: 8  Apgar5: 9  1 SAB              Last pap smear: Lab Results  Component Value Date   DIAGPAP  02/19/2023    - Negative for intraepithelial lesion or malignancy (NILM)   HPVHIGH Negative 02/19/2023   ***  Past Medical History:  Diagnosis Date   Anemia    Birth control counseling 01/18/2011   Hypothyroidism    Irregular uterine bleeding 04/24/2018   Lower abdominal pain 06/19/2021   Routine screening for STI (sexually transmitted infection) 06/09/2020   UTI (urinary tract infection)    Vaginal candidiasis 07/08/2019   Past Surgical History:  Procedure Laterality Date   NO PAST SURGERIES     Family History  Problem Relation Age of Onset   Hypertension Mother    Social History   Tobacco Use   Smoking status: Never    Passive exposure: Never   Smokeless tobacco: Never  Vaping Use   Vaping status: Never Used  Substance Use Topics   Alcohol use: No   Drug use: No   Allergies  Allergen Reactions   Cherry Flavoring Agent (Non-Screening) Anaphylaxis   Keflex [Cephalexin] Hives   Metronidazole Nausea And Vomiting    To PO form (while pregnant)   Peanut (Diagnostic) Hives   Tylenol [Acetaminophen] Hives    Amoxicillin Hives and Rash    Rxn about 1 yr ago   Ancef [Cefazolin] Rash    Perioral rash   Ibuprofen Rash   Penicillins Hives and Rash    Has patient had a PCN reaction causing immediate rash, facial/tongue/throat swelling, SOB or lightheadedness with hypotension: No Has patient had a PCN reaction causing severe rash involving mucus membranes or skin necrosis: No Has patient had a PCN reaction that required hospitalization: No Has patient had a PCN reaction occurring within the last 10 years: Yes If all of the above answers are "NO", then may proceed with Cephalosporin use.    Current Outpatient Medications on File Prior to Visit  Medication Sig Dispense Refill   cetirizine (ZYRTEC ALLERGY) 10 MG tablet Take 1 tablet (10 mg total) by mouth daily. 90 tablet 0   EPINEPHrine (EPIPEN 2-PAK) 0.3 mg/0.3 mL IJ SOAJ injection Inject 0.3 mg into the muscle as needed for anaphylaxis. 1 each 1   hydrocortisone cream 0.5 % Apply 1 Application topically 2 (two) times daily. Apply to affected areas twice daily 56 g 1   hydrOXYzine (  ATARAX) 25 MG tablet Take 1 tablet (25 mg total) by mouth every 6 (six) hours as needed for itching. 30 tablet 2   ondansetron (ZOFRAN-ODT) 4 MG disintegrating tablet Take 1 tablet (4 mg total) by mouth every 6 (six) hours as needed for nausea. 3 tablet 0   Prenatal Vit-Fe Phos-FA-Omega (VITAFOL GUMMIES) 3.33-0.333-34.8 MG CHEW Chew 3 tablets by mouth daily before breakfast. 90 tablet 11   triamcinolone ointment (KENALOG) 0.5 % Apply 1 Application topically 2 (two) times daily. 30 g 0   No current facility-administered medications on file prior to visit.     Exam   Vitals:   08/21/23 1447  BP: 109/71  Pulse: (!) 103  Weight: 138 lb (62.6 kg)   Fetal Heart Rate (bpm): 140  General:  Alert, oriented and cooperative. Patient is in no acute distress.  Breast: Symmetric, no masses or nipple discharge *** deferred  Cardiovascular: Normal heart rate noted   Respiratory: Normal respiratory effort, no problems with respiration noted  Abdomen: Soft, gravid, appropriate for gestational age.  Pain/Pressure: Present     Pelvic: {Blank single:19197::"Cervical exam performed in the presence of a chaperone","Cervical exam deferred"}        Extremities: Normal range of motion.     Mental Status: Normal mood and affect. Normal behavior. Normal judgment and thought content.    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 Supervision of high risk pregnancy, antepartum (Primary) ***  2. Hyperthyroidism affecting pregnancy in third trimester ***  3. Anemia during pregnancy in third trimester ***   Initial labs drawn. Continue prenatal vitamins. Genetic Screening discussed: NIPS, carrier screening and AFP, {requests/ordered/declines:14581}. Ultrasound discussed; fetal anatomic survey: {requests/ordered/declines:14581}. Problem list reviewed and updated. The nature of [facility] [facility] with multiple MDs and other Advanced Practice Providers was explained to patient; also emphasized that residents, students are part of our team. Routine obstetric precautions reviewed. No follow-ups on file.  Marci Setter, MD, FACOG Obstetrician & Gynecologist, [facility] for [facility], [facility]

## 2023-08-21 NOTE — Progress Notes (Signed)
   PRENATAL VISIT NOTE  Subjective:  Erin Wilkins is a 28 y.o. [phone] at [redacted]w[redacted]d being seen today for ongoing prenatal care.  She is currently monitored for the following issues for this high-risk pregnancy and has Nausea & vomiting; Supervision of high risk pregnancy, antepartum; Vaginal trauma; Groin pain, chronic, left; Epigastric pain; Anaphylactic syndrome; Orthostatic hypotension; Hyperthyroidism affecting pregnancy in third trimester; and Rash on their problem list.  Patient reports craving ice.  Contractions: Irregular. Vag. Bleeding: None.  Movement: Present. Denies leaking of fluid.   Hgb 8.6 on recent labs. Feels fatigue and craves ice/clay (not eating clay). Also reports some vaginal discharge.  The following portions of the patient's history were reviewed and updated as appropriate: allergies, current medications, past family history, past medical history, past social history, past surgical history and problem list.   Objective:   Vitals:   08/21/23 1447  BP: 109/71  Pulse: (!) 103  Weight: 138 lb (62.6 kg)   Body mass index is 26.07 kg/m. Total weight gain: 38 lb (17.2 kg)   Fetal Status: Fetal Heart Rate (bpm): 140 Fundal Height: 35 cm Movement: Present     General:  Alert, oriented and cooperative. Patient is in no acute distress.  Skin: Skin is warm and dry. No rash noted.   Cardiovascular: Normal heart rate noted  Respiratory: Normal respiratory effort, no problems with respiration noted  Abdomen: Soft, gravid, appropriate for gestational age.  Pain/Pressure: Present     Pelvic: Cervical exam deferred        Extremities: Normal range of motion.     Mental Status: Normal mood and affect. Normal behavior. Normal judgment and thought content.   Assessment and Plan:  Pregnancy: G5P1031 at [redacted]w[redacted]d 1. Supervision of high risk pregnancy, antepartum (Primary) Swab today given discharge symptoms Doing well GBS next visit - Cervicovaginal ancillary only( [facility])  2. Hyperthyroidism affecting pregnancy in third trimester No meds currently Endocrine appt tomorrow to reassess, will keep us  updated  3. Anemia during pregnancy in third trimester Likely iron deficiency. Recommend iron panel to confirm. Then reviewed IV iron infusions including risks/benefits. Risks of infusion reaction including rash, shortness of breath, anaphylaxis reviewed, however very well tolerated - Iron, TIBC and Ferritin Panel  4. Vaginal discharge Self swab - Cervicovaginal ancillary only( [place])  5. [redacted] weeks gestation of pregnancy - Cervicovaginal ancillary only( [place])  Preterm labor symptoms and general obstetric precautions including but not limited to vaginal bleeding, contractions, leaking of fluid and fetal movement were reviewed in detail with the patient. Please refer to After Visit Summary for other counseling recommendations.   Return in about 1 week (around 08/28/2023).  Future Appointments  Date Time Provider Department Center  08/22/2023  8:00 AM [facility] ENDO/NEURO LAB [facility] None  08/29/2023  8:00 AM Jorge Newcomer, MD [facility] None    Marci Setter, MD

## 2023-08-21 NOTE — Progress Notes (Signed)
 Pt complains of pain/tenderness at [employer]. Pt thinks she may have BV- will do self swab today. Pt hgb was 8.6 on 08/14/23.

## 2023-08-21 NOTE — Addendum Note (Signed)
 Addended by: Compton Brigance J on: 08/21/2023 03:22 PM   Modules accepted: Orders

## 2023-08-22 ENCOUNTER — Other Ambulatory Visit: Payer: Self-pay | Admitting: Obstetrics and Gynecology

## 2023-08-22 ENCOUNTER — Other Ambulatory Visit

## 2023-08-22 ENCOUNTER — Telehealth: Payer: Self-pay | Admitting: Pharmacy Technician

## 2023-08-22 ENCOUNTER — Encounter: Payer: Self-pay | Admitting: Obstetrics and Gynecology

## 2023-08-22 DIAGNOSIS — D509 Iron deficiency anemia, unspecified: Secondary | ICD-10-CM | POA: Insufficient documentation

## 2023-08-22 LAB — CERVICOVAGINAL ANCILLARY ONLY
Bacterial Vaginitis (gardnerella): NEGATIVE
Candida Glabrata: NEGATIVE
Candida Vaginitis: NEGATIVE
Chlamydia: NEGATIVE
Comment: NEGATIVE
Comment: NEGATIVE
Comment: NEGATIVE
Comment: NEGATIVE
Comment: NEGATIVE
Comment: NORMAL
Neisseria Gonorrhea: NEGATIVE
Trichomonas: NEGATIVE

## 2023-08-22 LAB — IRON,TIBC AND FERRITIN PANEL
Ferritin: 10 ng/mL — ABNORMAL LOW (ref 15–150)
Iron Saturation: 9 % — CL (ref 15–55)
Iron: 37 ug/dL (ref 27–159)
Total Iron Binding Capacity: 434 ug/dL (ref 250–450)
UIBC: 397 ug/dL (ref 131–425)

## 2023-08-22 NOTE — Telephone Encounter (Signed)
 Auth Submission: NO AUTH NEEDED Site of care: Site of care: [facility] Payer: [employer] Medication & CPT/J Code(s) submitted: Venofer (Iron Sucrose) J1756 Route of submission (phone, fax, portal):  Phone # Fax # Auth type: Buy/Bill PB Units/visits requested: 5 DOSES Reference number:  Approval from: 08/22/23 to 12/22/23

## 2023-08-26 ENCOUNTER — Encounter: Payer: Self-pay | Admitting: Obstetrics and Gynecology

## 2023-08-27 ENCOUNTER — Ambulatory Visit (INDEPENDENT_AMBULATORY_CARE_PROVIDER_SITE_OTHER)

## 2023-08-27 VITALS — BP 105/68 | HR 98 | Temp 98.8°F | Resp 20 | Ht 61.0 in | Wt 137.2 lb

## 2023-08-27 DIAGNOSIS — D508 Other iron deficiency anemias: Secondary | ICD-10-CM | POA: Diagnosis not present

## 2023-08-27 DIAGNOSIS — O99013 Anemia complicating pregnancy, third trimester: Secondary | ICD-10-CM | POA: Diagnosis not present

## 2023-08-27 DIAGNOSIS — Z3A36 36 weeks gestation of pregnancy: Secondary | ICD-10-CM

## 2023-08-27 DIAGNOSIS — D509 Iron deficiency anemia, unspecified: Secondary | ICD-10-CM

## 2023-08-27 MED ORDER — IRON SUCROSE 20 MG/ML IV SOLN
200.0000 mg | Freq: Once | INTRAVENOUS | Status: AC
  Administered 2023-08-27: 200 mg via INTRAVENOUS
  Filled 2023-08-27: qty 10

## 2023-08-27 MED ORDER — DIPHENHYDRAMINE HCL 25 MG PO CAPS
25.0000 mg | ORAL_CAPSULE | Freq: Once | ORAL | Status: AC
  Administered 2023-08-27: 25 mg via ORAL
  Filled 2023-08-27: qty 1

## 2023-08-27 NOTE — Patient Instructions (Signed)

## 2023-08-27 NOTE — Progress Notes (Signed)
 Diagnosis: Acute Anemia  Provider:  Chilton Greathouse MD  Procedure: IV Push  IV Type: Peripheral, IV Location: L Antecubital  Venofer (Iron Sucrose), Dose: 200 mg  Post Infusion IV Care: Observation period completed and Peripheral IV Discontinued  Discharge: Condition: Good, Destination: Home . AVS Provided  Performed by:  Nat Math, RN

## 2023-08-28 ENCOUNTER — Ambulatory Visit: Admitting: Obstetrics and Gynecology

## 2023-08-28 ENCOUNTER — Encounter: Payer: Self-pay | Admitting: Obstetrics and Gynecology

## 2023-08-28 VITALS — BP 112/66 | HR 104 | Wt 136.2 lb

## 2023-08-28 DIAGNOSIS — Z3A36 36 weeks gestation of pregnancy: Secondary | ICD-10-CM | POA: Diagnosis not present

## 2023-08-28 DIAGNOSIS — E0590 Thyrotoxicosis, unspecified without thyrotoxic crisis or storm: Secondary | ICD-10-CM

## 2023-08-28 DIAGNOSIS — O99283 Endocrine, nutritional and metabolic diseases complicating pregnancy, third trimester: Secondary | ICD-10-CM | POA: Diagnosis not present

## 2023-08-28 DIAGNOSIS — O0990 Supervision of high risk pregnancy, unspecified, unspecified trimester: Secondary | ICD-10-CM | POA: Diagnosis not present

## 2023-08-28 DIAGNOSIS — O99013 Anemia complicating pregnancy, third trimester: Secondary | ICD-10-CM

## 2023-08-28 NOTE — Progress Notes (Signed)
 Ankles swollen.   Pt states she started experiencing pressure in her "butt area" two days ago. Sharp pains in vagina area and lower abdomen for "about a week".   Pt also states she "cant hold my pee" and has urinated on herself the past 3 nights when she stands from a lying position.   Pt states light pink spotting started yesterday.  Pt has a rash on her abdomen around the belly button.

## 2023-08-28 NOTE — Addendum Note (Signed)
 Addended by: Whitfield Hamper on: 08/28/2023 04:02 PM   Modules accepted: Orders

## 2023-08-28 NOTE — Progress Notes (Signed)
   PRENATAL VISIT NOTE  Subjective:  Erin Wilkins is a 28 y.o. [phone] at [redacted]w[redacted]d being seen today for ongoing prenatal care.  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  Patient reports  pink spotting yesterday, denies pain, feeling regular movement  . Pelvic pressure  Contractions: Irritability. Vag. Bleeding: Scant.  Movement: Increased. Denies leaking of fluid.   The following portions of the patient's history were reviewed and updated as appropriate: allergies, current medications, past family history, past medical history, past social history, past surgical history and problem list.   Objective:   Vitals:   08/28/23 1437  BP: 112/66  Pulse: (!) 104  Weight: 136 lb 3.2 oz (61.8 kg)    Fetal Status: Fetal Heart Rate (bpm): 135   Movement: Increased     General:  Alert, oriented and cooperative. Patient is in no acute distress.  Skin: Skin is warm and dry. No rash noted.   Cardiovascular: Normal heart rate noted  Respiratory: Normal respiratory effort, no problems with respiration noted  Abdomen: Soft, gravid, appropriate for gestational age.  Pain/Pressure: Present     Pelvic: Cervical exam deferred        Extremities: Normal range of motion.  Edema: Trace  Mental Status: Normal mood and affect. Normal behavior. Normal judgment and thought content.   Assessment and Plan:  Pregnancy: G5P1031 at [redacted]w[redacted]d 1. Supervision of high risk pregnancy, antepartum (Primary) BP and FHR normal Doing well, feeling regular movement    2. Hyperthyroidism affecting pregnancy in third trimester Not on meds currently  Endocrine appt tomorrow  AM   3.  Anemia during pregnancy in third trimester S/p iv iron , next one tomorrow   4. [redacted] weeks gestation of pregnancy Swab collected today Labor precautions Supportive measures discussed regarding pressure  Preterm labor symptoms and general obstetric precautions including but not limited to vaginal bleeding, contractions, leaking of fluid and fetal movement were reviewed in detail with the patient. Please refer to After Visit Summary for other counseling recommendations.   Return in about 1 week (around 09/04/2023) for OB VISIT (MD or APP).  Future Appointments  Date Time Provider Department Center  08/29/2023  8:00 AM Jorge Newcomer, MD [facility] None  08/29/2023  9:00 AM [facility] None  09/02/2023  9:00 AM [facility] None  09/04/2023  9:15 AM [facility] None  09/06/2023  9:00 AM [facility] None    Susi Eric, FNP

## 2023-08-29 ENCOUNTER — Other Ambulatory Visit: Payer: Self-pay | Admitting: Obstetrics and Gynecology

## 2023-08-29 ENCOUNTER — Ambulatory Visit: Admitting: "Endocrinology

## 2023-08-29 ENCOUNTER — Ambulatory Visit

## 2023-08-29 ENCOUNTER — Encounter: Payer: Self-pay | Admitting: Obstetrics and Gynecology

## 2023-08-29 VITALS — BP 109/71 | HR 85 | Temp 98.4°F | Resp 18 | Ht 61.0 in | Wt 137.0 lb

## 2023-08-29 DIAGNOSIS — Z3A36 36 weeks gestation of pregnancy: Secondary | ICD-10-CM | POA: Diagnosis not present

## 2023-08-29 DIAGNOSIS — O99013 Anemia complicating pregnancy, third trimester: Secondary | ICD-10-CM

## 2023-08-29 DIAGNOSIS — D508 Other iron deficiency anemias: Secondary | ICD-10-CM

## 2023-08-29 DIAGNOSIS — D509 Iron deficiency anemia, unspecified: Secondary | ICD-10-CM

## 2023-08-29 MED ORDER — SODIUM CHLORIDE 0.9 % IV BOLUS
250.0000 mL | Freq: Once | INTRAVENOUS | Status: AC
  Administered 2023-08-29: 250 mL via INTRAVENOUS
  Filled 2023-08-29: qty 250

## 2023-08-29 MED ORDER — IRON SUCROSE 20 MG/ML IV SOLN
200.0000 mg | Freq: Once | INTRAVENOUS | Status: AC
  Administered 2023-08-29: 200 mg via INTRAVENOUS
  Filled 2023-08-29: qty 10

## 2023-08-29 MED ORDER — DIPHENHYDRAMINE HCL 25 MG PO CAPS
25.0000 mg | ORAL_CAPSULE | Freq: Once | ORAL | Status: AC
  Administered 2023-08-29: 25 mg via ORAL
  Filled 2023-08-29: qty 1

## 2023-08-29 NOTE — Progress Notes (Signed)
 Diagnosis: Iron Deficiency Anemia  Provider:  Chilton Greathouse MD  Procedure: IV Push  IV Type: Peripheral, IV Location: R Antecubital  Venofer (Iron Sucrose), Dose: 200 mg  Post Infusion IV Care: Observation period completed and Peripheral IV Discontinued  Discharge: Condition: Good, Destination: Home . AVS Declined  Performed by:  Rico Ala, LPN

## 2023-09-01 LAB — CULTURE, BETA STREP (GROUP B ONLY): Strep Gp B Culture: NEGATIVE

## 2023-09-02 ENCOUNTER — Encounter (HOSPITAL_COMMUNITY): Payer: Self-pay | Admitting: Obstetrics and Gynecology

## 2023-09-02 ENCOUNTER — Ambulatory Visit

## 2023-09-02 ENCOUNTER — Inpatient Hospital Stay (HOSPITAL_COMMUNITY)
Admission: AD | Admit: 2023-09-02 | Discharge: 2023-09-02 | Disposition: A | Discharge status: Home / Self Care | Payer: Self-pay | Attending: Obstetrics and Gynecology | Admitting: Obstetrics and Gynecology

## 2023-09-02 DIAGNOSIS — O479 False labor, unspecified: Secondary | ICD-10-CM | POA: Insufficient documentation

## 2023-09-02 DIAGNOSIS — M5459 Other low back pain: Secondary | ICD-10-CM | POA: Diagnosis present

## 2023-09-02 DIAGNOSIS — O471 False labor at or after 37 completed weeks of gestation: Secondary | ICD-10-CM | POA: Insufficient documentation

## 2023-09-02 DIAGNOSIS — Z0371 Encounter for suspected problem with amniotic cavity and membrane ruled out: Secondary | ICD-10-CM | POA: Diagnosis present

## 2023-09-02 DIAGNOSIS — Z3A37 37 weeks gestation of pregnancy: Secondary | ICD-10-CM

## 2023-09-02 DIAGNOSIS — R102 Pelvic and perineal pain: Secondary | ICD-10-CM | POA: Diagnosis present

## 2023-09-02 LAB — POCT FERN TEST: POCT Fern Test: NEGATIVE

## 2023-09-02 MED ORDER — DIPHENHYDRAMINE HCL 25 MG PO CAPS
25.0000 mg | ORAL_CAPSULE | Freq: Once | ORAL | Status: AC
Start: 2023-09-02 — End: ?

## 2023-09-02 MED ORDER — SODIUM CHLORIDE 0.9 % IV BOLUS
250.0000 mL | Freq: Once | INTRAVENOUS | Status: AC
Start: 2023-09-02 — End: ?
  Filled 2023-09-02: qty 250

## 2023-09-02 MED ORDER — IRON SUCROSE 20 MG/ML IV SOLN
200.0000 mg | Freq: Once | INTRAVENOUS | Status: AC
Start: 2023-09-02 — End: ?

## 2023-09-02 NOTE — MAU Provider Note (Signed)
 History     CSN: [id number]  Arrival date and time: 09/02/23 1527   None     Chief Complaint  Patient presents with   pelvic pressure   Back Pain   Vaginal Pain   Patient presents at [redacted]w[redacted]d with concern that her water may have broken. She reports waking up around 3 am with wetness in her underwear, and she is uncertain whether it was her waters or if she had peed herself. She reports some incontinence of urine over the last week related to positioning of her baby, especially when she stands from a lying or sitting position. Since around 0300, she has also been experiencing very mild, constant back and abdominal cramping. There is no discernable pattern, and she does not think she is contracting. She denies vaginal bleeding, chest pain, shortness of breath at this time.   Back Pain  Vaginal Pain Associated symptoms include back pain.    OB History     Gravida  5   Para  1   Term  1   Preterm      AB  3   Living  1      SAB  1   IAB  2   Ectopic      Multiple  0   Live Births  1           Past Medical History:  Diagnosis Date   Anemia    Birth control counseling 01/18/2011   Hypothyroidism    Irregular uterine bleeding 04/24/2018   Lower abdominal pain 06/19/2021   Routine screening for STI (sexually transmitted infection) 06/09/2020   UTI (urinary tract infection)    Vaginal candidiasis 07/08/2019    Past Surgical History:  Procedure Laterality Date   NO PAST SURGERIES      Family History  Problem Relation Age of Onset   Hypertension Mother     Social History   Tobacco Use   Smoking status: Never    Passive exposure: Never   Smokeless tobacco: Never  Vaping Use   Vaping status: Never Used  Substance Use Topics   Alcohol use: No   Drug use: No    Allergies:  Allergies  Allergen Reactions   Cherry Flavoring Agent (Non-Screening) Anaphylaxis   Keflex  [Cephalexin ] Hives   Metronidazole  Nausea And Vomiting    To PO form (while  pregnant)   Peanut (Diagnostic) Hives   Tylenol  [Acetaminophen ] Hives   Amoxicillin  Hives and Rash    Rxn about 1 yr ago   Ancef  [Cefazolin ] Rash    Perioral rash   Ibuprofen  Rash   Penicillins Hives and Rash    Has patient had a PCN reaction causing immediate rash, facial/tongue/throat swelling, SOB or lightheadedness with hypotension: No Has patient had a PCN reaction causing severe rash involving mucus membranes or skin necrosis: No Has patient had a PCN reaction that required hospitalization: No Has patient had a PCN reaction occurring within the last 10 years: Yes If all of the above answers are "NO", then may proceed with Cephalosporin use.     Medications Prior to Admission  Medication Sig Dispense Refill Last Dose/Taking   cetirizine  (ZYRTEC  ALLERGY) 10 MG tablet Take 1 tablet (10 mg total) by mouth daily. (Patient not taking: Reported on 08/28/2023) 90 tablet 0    EPINEPHrine  (EPIPEN  2-PAK) 0.3 mg/0.3 mL IJ SOAJ injection Inject 0.3 mg into the muscle as needed for anaphylaxis. 1 each 1    hydrocortisone  cream 0.5 % Apply 1 Application  topically 2 (two) times daily. Apply to affected areas twice daily 56 g 1    hydrOXYzine  (ATARAX ) 25 MG tablet Take 1 tablet (25 mg total) by mouth every 6 (six) hours as needed for itching. (Patient not taking: Reported on 08/28/2023) 30 tablet 2    ondansetron  (ZOFRAN -ODT) 4 MG disintegrating tablet Take 1 tablet (4 mg total) by mouth every 6 (six) hours as needed for nausea. (Patient not taking: Reported on 08/28/2023) 3 tablet 0    Prenatal Vit-Fe Phos-FA-Omega (VITAFOL  GUMMIES) 3.33-0.333-34.8 MG CHEW Chew 3 tablets by mouth daily before breakfast. 90 tablet 11    triamcinolone  ointment (KENALOG ) 0.5 % Apply 1 Application topically 2 (two) times daily. 30 g 0     Review of Systems  Genitourinary:  Positive for vaginal pain.  Musculoskeletal:  Positive for back pain.   Physical Exam   Blood pressure (!) 104/56, pulse 81, temperature 98.1 F  (36.7 C), resp. rate 16, height 5\' 1"  (1.549 m), weight 61.1 kg, last menstrual period 12/16/2022, SpO2 100%, unknown if currently breastfeeding.  Physical Exam Constitutional:      Appearance: Normal appearance. She is normal weight.  Cardiovascular:     Rate and Rhythm: Normal rate and regular rhythm.  Pulmonary:     Effort: Pulmonary effort is normal.     Breath sounds: Normal breath sounds.  Abdominal:     General: Bowel sounds are normal.     Palpations: Abdomen is soft.     Tenderness: There is no abdominal tenderness. There is no guarding.  Skin:    General: Skin is warm and dry.  Neurological:     Mental Status: She is alert.    MAU Course  Procedures  MDM Patient presenting with mild cramping/uterine irritability and ROM vs. Urinary incontinence. Korene Pert testing is ordered, will monitor for strength and regularity of contractions and proceed with labor check.   Assessment and Plan   Assessment & Plan False labor Fern test negative, contractions irregular and mild, cervical dilation 2/60/-2, unchanged from recent check in office. Patient is stable for discharge from MAU, recommend routine outpatient follow up [redacted] weeks gestation of pregnancy FWB Cat I    Rayma Calandra 09/02/2023, 5:18 PM

## 2023-09-02 NOTE — Assessment & Plan Note (Signed)
 Fern test negative, contractions irregular and mild, cervical dilation 2/60/-2, unchanged from recent check in office. Patient is stable for discharge from MAU, recommend routine outpatient follow up

## 2023-09-02 NOTE — MAU Note (Signed)
 Erin Wilkins is a 28 y.o. at [redacted]w[redacted]d here in MAU reporting: started last night.  Feeling pressure around her butt. Pressure in lower abd and lower back. Went to the bathroom, still feeling pressure. Was 2cm when last checked. No bleeding ? Leaking, ? Or peed on herself during the night (0300)- has not had more leaking since then.  Has lost her mucous plug. Pain in lower back and sharp pain in vagina( worse when she is walking.  Onset of complaint: last night Pain score: vag moderate, back mild Vitals:   09/02/23 1639  BP: (!) 104/56  Pulse: 81  Resp: 16  Temp: 98.1 F (36.7 C)  SpO2: 100%     [id number]:WRUEAV dress on, reports +FM Lab orders placed from triage:  fern

## 2023-09-02 NOTE — Assessment & Plan Note (Signed)
 FWB Cat I

## 2023-09-04 ENCOUNTER — Ambulatory Visit

## 2023-09-04 MED ORDER — IRON SUCROSE 20 MG/ML IV SOLN: 200.0000 mg | Freq: Once | INTRAVENOUS | Status: AC

## 2023-09-04 MED ORDER — DIPHENHYDRAMINE HCL 25 MG PO CAPS
25.0000 mg | ORAL_CAPSULE | Freq: Once | ORAL | Status: AC
Start: 2023-09-04 — End: ?

## 2023-09-05 ENCOUNTER — Encounter: Admitting: Obstetrics and Gynecology

## 2023-09-06 ENCOUNTER — Ambulatory Visit (INDEPENDENT_AMBULATORY_CARE_PROVIDER_SITE_OTHER)

## 2023-09-06 VITALS — BP 95/67 | HR 86 | Temp 98.7°F | Resp 18 | Ht 61.0 in | Wt 135.8 lb

## 2023-09-06 DIAGNOSIS — Z3A37 37 weeks gestation of pregnancy: Secondary | ICD-10-CM | POA: Diagnosis not present

## 2023-09-06 DIAGNOSIS — D508 Other iron deficiency anemias: Secondary | ICD-10-CM | POA: Diagnosis not present

## 2023-09-06 DIAGNOSIS — O99013 Anemia complicating pregnancy, third trimester: Secondary | ICD-10-CM

## 2023-09-06 DIAGNOSIS — O99019 Anemia complicating pregnancy, unspecified trimester: Secondary | ICD-10-CM

## 2023-09-06 MED ORDER — IRON SUCROSE 20 MG/ML IV SOLN
200.0000 mg | Freq: Once | INTRAVENOUS | Status: AC
  Administered 2023-09-06: 200 mg via INTRAVENOUS
  Filled 2023-09-06: qty 10

## 2023-09-06 MED ORDER — SODIUM CHLORIDE 0.9 % IV BOLUS
250.0000 mL | Freq: Once | INTRAVENOUS | Status: AC
  Administered 2023-09-06: 250 mL via INTRAVENOUS
  Filled 2023-09-06: qty 250

## 2023-09-06 MED ORDER — DIPHENHYDRAMINE HCL 25 MG PO CAPS
25.0000 mg | ORAL_CAPSULE | Freq: Once | ORAL | Status: AC
  Administered 2023-09-06: 25 mg via ORAL
  Filled 2023-09-06: qty 1

## 2023-09-06 NOTE — Progress Notes (Signed)
 Diagnosis: Iron  Deficiency Anemia  Provider:  Praveen Mannam MD  Procedure: IV Push  IV Type: Peripheral, IV Location: R Antecubital  Venofer  (Iron  Sucrose), Dose: 200 mg  Post Infusion IV Care: Patient declined observation and Peripheral IV Discontinued  Discharge: Condition: Good, Destination: Home . AVS Declined  Performed by:  Keeara Frees, RN

## 2023-09-09 ENCOUNTER — Ambulatory Visit (INDEPENDENT_AMBULATORY_CARE_PROVIDER_SITE_OTHER): Admitting: Obstetrics & Gynecology

## 2023-09-09 ENCOUNTER — Encounter (HOSPITAL_COMMUNITY): Payer: Self-pay | Admitting: Obstetrics and Gynecology

## 2023-09-09 ENCOUNTER — Inpatient Hospital Stay (HOSPITAL_COMMUNITY)
Admission: AD | Admit: 2023-09-09 | Discharge: 2023-09-12 | DRG: 807 | Disposition: A | Discharge status: Home / Self Care | Payer: Self-pay | Attending: Family Medicine | Admitting: Family Medicine

## 2023-09-09 ENCOUNTER — Encounter: Payer: Self-pay | Admitting: Obstetrics & Gynecology

## 2023-09-09 VITALS — BP 103/67 | HR 95 | Wt 138.2 lb

## 2023-09-09 DIAGNOSIS — Z8249 Family history of ischemic heart disease and other diseases of the circulatory system: Secondary | ICD-10-CM

## 2023-09-09 DIAGNOSIS — O99284 Endocrine, nutritional and metabolic diseases complicating childbirth: Secondary | ICD-10-CM | POA: Diagnosis present

## 2023-09-09 DIAGNOSIS — D509 Iron deficiency anemia, unspecified: Secondary | ICD-10-CM | POA: Diagnosis present

## 2023-09-09 DIAGNOSIS — Z3A38 38 weeks gestation of pregnancy: Secondary | ICD-10-CM

## 2023-09-09 DIAGNOSIS — E161 Other hypoglycemia: Secondary | ICD-10-CM | POA: Diagnosis not present

## 2023-09-09 DIAGNOSIS — O4292 Full-term premature rupture of membranes, unspecified as to length of time between rupture and onset of labor: Principal | ICD-10-CM | POA: Diagnosis present

## 2023-09-09 DIAGNOSIS — Z3483 Encounter for supervision of other normal pregnancy, third trimester: Secondary | ICD-10-CM

## 2023-09-09 DIAGNOSIS — O9902 Anemia complicating childbirth: Secondary | ICD-10-CM | POA: Diagnosis present

## 2023-09-09 DIAGNOSIS — Z0371 Encounter for suspected problem with amniotic cavity and membrane ruled out: Principal | ICD-10-CM

## 2023-09-09 DIAGNOSIS — Z880 Allergy status to penicillin: Secondary | ICD-10-CM

## 2023-09-09 DIAGNOSIS — E0590 Thyrotoxicosis, unspecified without thyrotoxic crisis or storm: Secondary | ICD-10-CM | POA: Diagnosis present

## 2023-09-09 DIAGNOSIS — O479 False labor, unspecified: Secondary | ICD-10-CM | POA: Diagnosis not present

## 2023-09-09 DIAGNOSIS — E162 Hypoglycemia, unspecified: Secondary | ICD-10-CM | POA: Diagnosis not present

## 2023-09-09 LAB — POCT FERN TEST
POCT Fern Test: POSITIVE
POCT Fern Test: POSITIVE

## 2023-09-09 NOTE — MAU Note (Signed)
 MAU Triage Note  .Erin Wilkins is a 28 y.o. at [redacted]w[redacted]d here in MAU reporting: came by EMS.  Gush of fluid at 1030pm. Clear. No bleeding. +FM.    Pain score: 5/10 lower abdominal and radiates to lower back.    Vitals:   09/09/23 2313  BP: 119/73  Pulse: 70  Resp: 18  Temp: 98.6 F (37 C)  SpO2: 97%     FHT: 124 Lab orders placed from triage: Korene Pert slide.

## 2023-09-09 NOTE — Progress Notes (Signed)
   PRENATAL VISIT NOTE  Subjective:  Erin Wilkins is a 28 y.o. [phone] at [redacted]w[redacted]d being seen today for ongoing prenatal care.  She is currently monitored for the following issues for this low-risk pregnancy and has Nausea & vomiting; Supervision of high risk pregnancy, antepartum; Vaginal trauma; Groin pain, chronic, left; Epigastric pain; Anaphylactic syndrome; Orthostatic hypotension; Hyperthyroidism affecting pregnancy in third trimester; Rash; Iron  deficiency anemia during pregnancy; False labor; and [redacted] weeks gestation of pregnancy on their problem list.  Patient reports occasional contractions.  Contractions: Irregular. Vag. Bleeding: None.  Movement: Increased. Denies leaking of fluid.   The following portions of the patient's history were reviewed and updated as appropriate: allergies, current medications, past family history, past medical history, past social history, past surgical history and problem list.   Objective:   Vitals:   09/09/23 1606  BP: 103/67  Pulse: 95  Weight: 138 lb 3.2 oz (62.7 kg)    Fetal Status: Fetal Heart Rate (bpm): 143   Movement: Increased     General:  Alert, oriented and cooperative. Patient is in no acute distress.  Skin: Skin is warm and dry. No rash noted.   Cardiovascular: Normal heart rate noted  Respiratory: Normal respiratory effort, no problems with respiration noted  Abdomen: Soft, gravid, appropriate for gestational age.  Pain/Pressure: Present     Pelvic: Cervical exam deferred        Extremities: Normal range of motion.  Edema: None  Mental Status: Normal mood and affect. Normal behavior. Normal judgment and thought content.   Assessment and Plan:  Pregnancy: G5P1031 at [redacted]w[redacted]d 1. [redacted] weeks gestation of pregnancy (Primary) [redacted]w[redacted]d   Term labor symptoms and general obstetric precautions including but not limited to vaginal bleeding, contractions, leaking of fluid and fetal movement were reviewed in detail with the patient. Please refer  to After Visit Summary for other counseling recommendations.   Return in about 1 week (around 09/16/2023).  Future Appointments  Date Time Provider Department Center  09/10/2023 10:00 AM [facility] None  09/12/2023 10:15 AM [facility] None  10/02/2023  2:20 PM Motwani, Komal, MD [facility] None    Onnie Bilis, MD

## 2023-09-09 NOTE — Progress Notes (Signed)
 Pt states pain and pressure that resulted in an ER visit last week. She was told contractions were too far apart but she was dilated 2.5 cm  Pt states thin white vaginal discharge the past two days. Pt prefers not to use pill treatment due to nausea, prefers gel treatment.

## 2023-09-10 ENCOUNTER — Other Ambulatory Visit: Payer: Self-pay

## 2023-09-10 ENCOUNTER — Inpatient Hospital Stay (HOSPITAL_COMMUNITY): Admitting: Anesthesiology

## 2023-09-10 ENCOUNTER — Encounter (HOSPITAL_COMMUNITY): Payer: Self-pay | Admitting: Family Medicine

## 2023-09-10 ENCOUNTER — Other Ambulatory Visit (HOSPITAL_COMMUNITY)
Admission: RE | Admit: 2023-09-10 | Discharge: 2023-09-10 | Disposition: A | Discharge status: Home / Self Care | Source: Ambulatory Visit

## 2023-09-10 ENCOUNTER — Ambulatory Visit

## 2023-09-10 DIAGNOSIS — O99284 Endocrine, nutritional and metabolic diseases complicating childbirth: Secondary | ICD-10-CM | POA: Diagnosis not present

## 2023-09-10 DIAGNOSIS — Z880 Allergy status to penicillin: Secondary | ICD-10-CM | POA: Diagnosis not present

## 2023-09-10 DIAGNOSIS — E0590 Thyrotoxicosis, unspecified without thyrotoxic crisis or storm: Secondary | ICD-10-CM | POA: Diagnosis not present

## 2023-09-10 DIAGNOSIS — O4202 Full-term premature rupture of membranes, onset of labor within 24 hours of rupture: Secondary | ICD-10-CM | POA: Diagnosis not present

## 2023-09-10 DIAGNOSIS — O9902 Anemia complicating childbirth: Secondary | ICD-10-CM | POA: Diagnosis not present

## 2023-09-10 DIAGNOSIS — O26893 Other specified pregnancy related conditions, third trimester: Secondary | ICD-10-CM | POA: Diagnosis not present

## 2023-09-10 DIAGNOSIS — Z8249 Family history of ischemic heart disease and other diseases of the circulatory system: Secondary | ICD-10-CM | POA: Diagnosis not present

## 2023-09-10 DIAGNOSIS — Z3A38 38 weeks gestation of pregnancy: Secondary | ICD-10-CM | POA: Diagnosis not present

## 2023-09-10 DIAGNOSIS — D509 Iron deficiency anemia, unspecified: Secondary | ICD-10-CM | POA: Diagnosis not present

## 2023-09-10 DIAGNOSIS — O4292 Full-term premature rupture of membranes, unspecified as to length of time between rupture and onset of labor: Secondary | ICD-10-CM | POA: Diagnosis not present

## 2023-09-10 LAB — T4, FREE: Free T4: 0.73 ng/dL (ref 0.61–1.12)

## 2023-09-10 LAB — TYPE AND SCREEN
ABO/RH(D): O POS
Antibody Screen: NEGATIVE

## 2023-09-10 LAB — CBC
HCT: 29 % — ABNORMAL LOW (ref 36.0–46.0)
Hemoglobin: 9.5 g/dL — ABNORMAL LOW (ref 12.0–15.0)
MCH: 29.6 pg (ref 26.0–34.0)
MCHC: 32.8 g/dL (ref 30.0–36.0)
MCV: 90.3 fL (ref 80.0–100.0)
Platelets: 299 10*3/uL (ref 150–400)
RBC: 3.21 MIL/uL — ABNORMAL LOW (ref 3.87–5.11)
RDW: 18.6 % — ABNORMAL HIGH (ref 11.5–15.5)
WBC: 11.2 10*3/uL — ABNORMAL HIGH (ref 4.0–10.5)
nRBC: 0 % (ref 0.0–0.2)

## 2023-09-10 LAB — TSH: TSH: 1.664 u[IU]/mL (ref 0.350–4.500)

## 2023-09-10 LAB — RPR: RPR Ser Ql: NONREACTIVE

## 2023-09-10 MED ORDER — LACTATED RINGERS IV SOLN: 500.0000 mL | INTRAVENOUS | Status: DC | PRN

## 2023-09-10 MED ORDER — ONDANSETRON HCL 4 MG/2ML IJ SOLN: 4.0000 mg | Freq: Four times a day (QID) | INTRAMUSCULAR | Status: DC | PRN

## 2023-09-10 MED ORDER — DIBUCAINE (PERIANAL) 1 % EX OINT: 1.0000 | TOPICAL_OINTMENT | CUTANEOUS | Status: DC | PRN

## 2023-09-10 MED ORDER — ONDANSETRON HCL 4 MG PO TABS: 4.0000 mg | ORAL_TABLET | ORAL | Status: DC | PRN

## 2023-09-10 MED ORDER — LACTATED RINGERS IV SOLN: 500.0000 mL | Freq: Once | INTRAVENOUS | Status: DC

## 2023-09-10 MED ORDER — SENNOSIDES-DOCUSATE SODIUM 8.6-50 MG PO TABS
2.0000 | ORAL_TABLET | ORAL | Status: DC
  Administered 2023-09-10: 2 via ORAL
  Filled 2023-09-10: qty 2

## 2023-09-10 MED ORDER — SOD CITRATE-CITRIC ACID 500-334 MG/5ML PO SOLN: 30.0000 mL | ORAL | Status: DC | PRN

## 2023-09-10 MED ORDER — MEASLES, MUMPS & RUBELLA VAC IJ SOLR: 0.5000 mL | Freq: Once | INTRAMUSCULAR | Status: DC

## 2023-09-10 MED ORDER — FENTANYL-BUPIVACAINE-NACL 0.5-0.125-0.9 MG/250ML-% EP SOLN: 12.0000 mL/h | EPIDURAL | Status: DC | PRN

## 2023-09-10 MED ORDER — METHYLERGONOVINE MALEATE 0.2 MG/ML IJ SOLN: 0.2000 mg | INTRAMUSCULAR | Status: DC | PRN

## 2023-09-10 MED ORDER — EPHEDRINE 5 MG/ML INJ: 10.0000 mg | INTRAVENOUS | Status: DC | PRN

## 2023-09-10 MED ORDER — FERROUS SULFATE 325 (65 FE) MG PO TABS
325.0000 mg | ORAL_TABLET | ORAL | Status: DC
  Administered 2023-09-10 – 2023-09-12 (×2): 325 mg via ORAL
  Filled 2023-09-10 (×2): qty 1

## 2023-09-10 MED ORDER — OXYTOCIN BOLUS FROM INFUSION
333.0000 mL | Freq: Once | INTRAVENOUS | Status: AC
  Administered 2023-09-10: 333 mL via INTRAVENOUS

## 2023-09-10 MED ORDER — LIDOCAINE HCL (PF) 1 % IJ SOLN: 30.0000 mL | INTRAMUSCULAR | Status: DC | PRN

## 2023-09-10 MED ORDER — BISACODYL 10 MG RE SUPP: 10.0000 mg | Freq: Every day | RECTAL | Status: DC | PRN

## 2023-09-10 MED ORDER — PHENYLEPHRINE 80 MCG/ML (10ML) SYRINGE FOR IV PUSH (FOR BLOOD PRESSURE SUPPORT): 80.0000 ug | PREFILLED_SYRINGE | INTRAVENOUS | Status: DC | PRN

## 2023-09-10 MED ORDER — LIDOCAINE HCL (PF) 1 % IJ SOLN
INTRAMUSCULAR | Status: DC | PRN
  Administered 2023-09-10: 8 mL via EPIDURAL

## 2023-09-10 MED ORDER — COCONUT OIL OIL: 1.0000 | TOPICAL_OIL | Status: DC | PRN

## 2023-09-10 MED ORDER — OXYTOCIN-SODIUM CHLORIDE 30-0.9 UT/500ML-% IV SOLN
1.0000 m[IU]/min | INTRAVENOUS | Status: DC
  Administered 2023-09-10: 2 m[IU]/min via INTRAVENOUS

## 2023-09-10 MED ORDER — FLEET ENEMA RE ENEM: 1.0000 | ENEMA | Freq: Every day | RECTAL | Status: DC | PRN

## 2023-09-10 MED ORDER — TERBUTALINE SULFATE 1 MG/ML IJ SOLN: 0.2500 mg | Freq: Once | INTRAMUSCULAR | Status: DC | PRN

## 2023-09-10 MED ORDER — METHYLERGONOVINE MALEATE 0.2 MG PO TABS: 0.2000 mg | ORAL_TABLET | ORAL | Status: DC | PRN

## 2023-09-10 MED ORDER — ONDANSETRON HCL 4 MG/2ML IJ SOLN: 4.0000 mg | INTRAMUSCULAR | Status: DC | PRN

## 2023-09-10 MED ORDER — DIPHENHYDRAMINE HCL 25 MG PO CAPS: 25.0000 mg | ORAL_CAPSULE | Freq: Four times a day (QID) | ORAL | Status: DC | PRN

## 2023-09-10 MED ORDER — DIPHENHYDRAMINE HCL 50 MG/ML IJ SOLN: 12.5000 mg | INTRAMUSCULAR | Status: DC | PRN

## 2023-09-10 MED ORDER — OXYTOCIN-SODIUM CHLORIDE 30-0.9 UT/500ML-% IV SOLN
2.5000 [IU]/h | INTRAVENOUS | Status: DC
  Filled 2023-09-10: qty 500

## 2023-09-10 MED ORDER — SODIUM CHLORIDE 0.9 % IV BOLUS
250.0000 mL | Freq: Once | INTRAVENOUS | Status: AC
  Filled 2023-09-10: qty 250

## 2023-09-10 MED ORDER — PRENATAL MULTIVITAMIN CH
1.0000 | ORAL_TABLET | Freq: Every day | ORAL | Status: DC
  Administered 2023-09-10 – 2023-09-12 (×3): 1 via ORAL
  Filled 2023-09-10 (×3): qty 1

## 2023-09-10 MED ORDER — TETANUS-DIPHTH-ACELL PERTUSSIS 5-2.5-18.5 LF-MCG/0.5 IM SUSY: 0.5000 mL | PREFILLED_SYRINGE | Freq: Once | INTRAMUSCULAR | Status: DC

## 2023-09-10 MED ORDER — FENTANYL-BUPIVACAINE-NACL 0.5-0.125-0.9 MG/250ML-% EP SOLN
12.0000 mL/h | EPIDURAL | Status: DC | PRN
  Administered 2023-09-10: 12 mL/h via EPIDURAL
  Filled 2023-09-10: qty 250

## 2023-09-10 MED ORDER — SIMETHICONE 80 MG PO CHEW: 80.0000 mg | CHEWABLE_TABLET | ORAL | Status: DC | PRN

## 2023-09-10 MED ORDER — DIPHENHYDRAMINE HCL 25 MG PO CAPS
25.0000 mg | ORAL_CAPSULE | Freq: Once | ORAL | Status: AC
Start: 2023-09-10 — End: ?

## 2023-09-10 MED ORDER — BENZOCAINE-MENTHOL 20-0.5 % EX AERO
1.0000 | INHALATION_SPRAY | CUTANEOUS | Status: DC | PRN
  Administered 2023-09-10: 1 via TOPICAL
  Filled 2023-09-10: qty 56

## 2023-09-10 MED ORDER — WITCH HAZEL-GLYCERIN EX PADS: 1.0000 | MEDICATED_PAD | CUTANEOUS | Status: DC | PRN

## 2023-09-10 MED ORDER — LACTATED RINGERS IV SOLN: INTRAVENOUS | Status: DC

## 2023-09-10 MED ORDER — OXYCODONE HCL 5 MG PO TABS
5.0000 mg | ORAL_TABLET | ORAL | Status: DC | PRN
  Administered 2023-09-10 – 2023-09-11 (×2): 5 mg via ORAL
  Filled 2023-09-10 (×2): qty 1

## 2023-09-10 NOTE — Discharge Summary (Signed)
 Postpartum Discharge Summary     Patient Name: Erin Wilkins DOB: [birth year] MRN: [id number]  Date of admission: 09/09/2023 Delivery date:09/10/2023 Delivering provider: Wilford Hanks Date of discharge: 09/12/2023  Admitting diagnosis: PROM (premature rupture of membranes) [O42.90] Intrauterine pregnancy: [redacted]w[redacted]d     Secondary diagnosis:  Active Problems:   Hyperthyroidism affecting pregnancy in third trimester   Iron  deficiency anemia during pregnancy  Additional problems: none    Discharge diagnosis: Term Pregnancy Delivered and Anemia                                              Post partum procedures:none Augmentation: Pitocin  Complications: None  Hospital course: Onset of Labor With Vaginal Delivery      28 y.o. yo [id number] at [redacted]w[redacted]d was admitted in Latent Labor with SROM on 09/09/2023. Labor course was uncomplicated.  Membrane Rupture Time/Date: 11:30 PM,09/09/2023  Delivery Method:Vaginal, Spontaneous Operative Delivery:N/A Episiotomy: None Lacerations:  1st degree Patient had an uncomplicated postpartum course.  She is ambulating, tolerating a regular diet, passing flatus, and urinating well. Patient is discharged home in stable condition on 09/12/23.  Newborn Data: Birth date:09/10/2023 Birth time:12:49 PM Gender:Female-Demi Sky Living status:Living Apgars:8 ,9  Weight:3200 g (7lb 0.9oz)  Magnesium Sulfate received: No BMZ received: No Rhophylac:N/A MMR:N/A T-DaP:declined prenatally Flu: No RSV Vaccine received: No Transfusion:No  Immunizations received: Immunization History  Administered Date(s) Administered   H1N1 03/04/2008   Hepatitis A 07/04/2007   Hpv-Unspecified 07/04/2007, 01/16/2008   Influenza Split 05/17/2011, 03/25/2012   Influenza Whole 02/08/2009   Influenza,inj,Quad PF,6+ Mos 02/06/2013, 03/10/2015, 01/10/2016, 02/28/2017, 03/04/2018   Meningococcal polysaccharide vaccine (MPSV4) 07/04/2007   PPD Test 02/05/2017, 12/21/2019, 07/17/2021,  07/31/2023   Td 07/04/2007   Tdap 12/31/2014    Physical exam  Vitals:   09/10/23 2330 09/11/23 0500 09/11/23 1415 09/11/23 2117  BP: 117/64 105/61 112/64 105/63  Pulse: 64 74 70 92  Resp: 18 18 18 17   Temp: 98.6 F (37 C) 97.8 F (36.6 C) 97.9 F (36.6 C) 98.4 F (36.9 C)  TempSrc: Oral Oral Oral Oral  SpO2: 100% 99% 100% 100%  Weight:      Height:       General: alert and cooperative Lochia: appropriate Uterine Fundus: firm Incision: N/A DVT Evaluation: No evidence of DVT seen on physical exam. Labs: Lab Results  Component Value Date   WBC 11.2 (H) 09/10/2023   HGB 9.5 (L) 09/10/2023   HCT 29.0 (L) 09/10/2023   MCV 90.3 09/10/2023   PLT 299 09/10/2023      Latest Ref Rng & Units 05/02/2023   11:27 AM  CMP  Glucose 70 - 99 mg/dL 78   BUN 6 - 20 mg/dL 10   Creatinine 2.95 - 1.00 mg/dL 6.21   Sodium 308 - 657 mmol/L 140   Potassium 3.5 - 5.2 mmol/L 4.5   Chloride 96 - 106 mmol/L 108   CO2 20 - 29 mmol/L 17   Calcium 8.7 - 10.2 mg/dL 8.8    Edinburgh Score:    09/11/2023    5:58 PM  Edinburgh Postnatal Depression Scale Screening Tool  I have been able to laugh and see the funny side of things. 0  I have looked forward with enjoyment to things. 0  I have blamed myself unnecessarily when things went wrong. 1  I have been anxious or  worried for no good reason. 0  I have felt scared or panicky for no good reason. 0  Things have been getting on top of me. 1  I have been so unhappy that I have had difficulty sleeping. 0  I have felt sad or miserable. 0  I have been so unhappy that I have been crying. 0  The thought of harming myself has occurred to me. 0  Edinburgh Postnatal Depression Scale Total 2   Edinburgh Postnatal Depression Scale Total: 2   After visit meds:  Allergies as of 09/12/2023       Reactions   Cherry Flavoring Agent (non-screening) Anaphylaxis   Keflex  [cephalexin ] Hives   Metronidazole  Nausea And Vomiting   To PO form (while pregnant)    Peanut (diagnostic) Hives   Tylenol  [acetaminophen ] Hives   Amoxicillin  Hives, Rash   Rxn about 1 yr ago   Ancef  [cefazolin ] Rash   Perioral rash   Ibuprofen  Rash   Penicillins Hives, Rash   Has patient had a PCN reaction causing immediate rash, facial/tongue/throat swelling, SOB or lightheadedness with hypotension: No Has patient had a PCN reaction causing severe rash involving mucus membranes or skin necrosis: No Has patient had a PCN reaction that required hospitalization: No Has patient had a PCN reaction occurring within the last 10 years: Yes If all of the above answers are "NO", then may proceed with Cephalosporin use.        Medication List     STOP taking these medications    cetirizine  10 MG tablet Commonly known as: ZyrTEC  Allergy   EPINEPHrine  0.3 mg/0.3 mL Soaj injection Commonly known as: EpiPen  2-Pak   hydrocortisone  cream 0.5 %   hydrOXYzine  25 MG tablet Commonly known as: ATARAX    ondansetron  4 MG disintegrating tablet Commonly known as: ZOFRAN -ODT   triamcinolone  ointment 0.5 % Commonly known as: KENALOG        TAKE these medications    cyclobenzaprine  5 MG tablet Commonly known as: FLEXERIL  Take 1 tablet (5 mg total) by mouth 3 (three) times daily as needed for muscle spasms.   ferrous sulfate  325 (65 FE) MG tablet Take 1 tablet (325 mg total) by mouth every other day. Start taking on: Sep 14, 2023   senna-docusate 8.6-50 MG tablet Commonly known as: Senokot-S Take 2 tablets by mouth at bedtime.   Vitafol  Gummies 3.33-0.333-34.8 MG Chew Chew 3 tablets by mouth daily before breakfast.         Discharge home in stable condition Infant Feeding: Bottle and Breast Infant Disposition:home with mother Discharge instruction: per After Visit Summary and Postpartum booklet. Activity: Advance as tolerated. Pelvic rest for 6 weeks.  Diet: routine diet Future Appointments: Future Appointments  Date Time Provider Department Center   09/24/2023 10:00 AM [facility] NURSE [facility] None  10/02/2023  2:20 PM Jorge Newcomer, MD [facility] None  10/22/2023  8:55 AM Leftwich-Kirby, Darren Em, CNM [facility] None   Follow up Visit:  Jolayne Natter, CNM  P [facility] Please schedule this patient for Postpartum visit in: 6 weeks with the following provider: Any provider In-Person For C/S patients schedule nurse incision check in weeks 2 weeks: no High risk pregnancy complicated by: hyperthyroidism Delivery mode:  SVD Anticipated Birth Control:  Nexplanon  (@ PP visit) PP Procedures needed: none Schedule Integrated BH visit: no   09/11/2023 Jolayne Natter, CNM  09/12/2023 1:19 PM Kraig Peru MSN, CNM Advanced Practice Provider, Center for [facility]

## 2023-09-10 NOTE — Anesthesia Procedure Notes (Signed)
 Epidural Patient location during procedure: OB Start time: 09/10/2023 3:53 AM End time: 09/10/2023 4:00 AM  Staffing Anesthesiologist: Rhenda Cedars, MD  Preanesthetic Checklist Completed: patient identified, IV checked, site marked, risks and benefits discussed, surgical consent, monitors and equipment checked, pre-op evaluation and timeout performed  Epidural Patient position: sitting Prep: DuraPrep and site prepped and draped Patient monitoring: continuous pulse ox and blood pressure Approach: midline Location: L3-L4 Injection technique: LOR air  Needle:  Needle type: Tuohy  Needle gauge: 17 G Needle length: 9 cm and 9 Needle insertion depth: 5 cm Catheter type: closed end flexible Catheter size: 19 Gauge Catheter at skin depth: 10 cm Test dose: negative  Assessment Events: blood not aspirated, no cerebrospinal fluid, injection not painful, no injection resistance, no paresthesia and negative IV test

## 2023-09-10 NOTE — Progress Notes (Signed)
 Erin Wilkins is a 28 y.o. [id number] at [redacted]w[redacted]d by LMP admitted for SOL  Subjective: Patient reports being comfortable in bed with her epidural. She reports that Dr. Scherrie Curt discussed labor augmentation interventions and she is agreeable to that now.   Objective: BP 117/82   Pulse 65   Temp 98 F (36.7 C) (Oral)   Resp 18   Ht 5\' 1"  (1.549 m)   Wt 62.1 kg   LMP 12/16/2022 (Exact Date)   SpO2 100%   BMI 25.89 kg/m     FHT:  FHR: 125 bpm, variability: min-moderate,  accelerations:  Present,  decelerations:  Absent UC:   regular, every 2-4 minutes SVE:   Dilation: 7 Effacement (%): 100 Station: -2 Exam by:: Basilio Both RN  Labs: Lab Results  Component Value Date   WBC 11.2 (H) 09/10/2023   HGB 9.5 (L) 09/10/2023   HCT 29.0 (L) 09/10/2023   MCV 90.3 09/10/2023   PLT 299 09/10/2023    Assessment / Plan: Protracted active phase  Labor:  Patient amenable to pitocin  augmentation. Will start pitocin  2x2 until contractions are 2-3 minutes apart.  Preeclampsia:   N/A Fetal Wellbeing:  Category I Pain Control:  Epidural I/D:  n/a Anticipated MOD:  NSVD  Wilford Hanks, Student-MidWife 09/10/2023, 11:12 AM

## 2023-09-10 NOTE — H&P (Addendum)
 OBSTETRIC ADMISSION HISTORY AND PHYSICAL  Erin Wilkins is a 28 y.o. female [phone] with IUP at [redacted]w[redacted]d by LMP presenting for SOL following SROM 2230 5/5. She reports +FMs, No LOF, no VB, no blurry vision, headaches or peripheral edema, and RUQ pain.  She plans on breast and bottle feeding. She request Nexplanon  for birth control. 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   Orthostatic hypotension 03/21/2023   Epigastric pain 10/24/2022   Groin pain, chronic, left 05/14/2022   Vaginal trauma 08/22/2021   Supervision of high risk pregnancy, antepartum 09/16/2014   Nausea & vomiting 08/04/2014   NURSING  PROVIDER  Office Location [facility] Xfer to [facility] Dating by LMP  cw US  at 6w      Anatomy U/S WNL  Initiated care at  [employer]  English               LAB RESULTS   Support Person  MOTHER Genetics NIPS:   collected 11/21    LR Horizon neg      AFP:                  NT/IT (FT only)        Carrier Screen Horizon:  Rhogam  O/Positive/-- (11/21 1553) A1C/GTT Early:             Third trimester:  Flu Vaccine  DECLINED      TDaP Vaccine  Declined Blood Type O/Positive/-- (11/21 1553)  Covid Vaccine  NO Antibody Negative (11/21 1553)  Abrysvo RSV Vaccine   Rubella 1.85 (11/21 1553)  Feeding Plan  UNSURE/BREASTFEED RPR Non Reactive (11/21 1553)  Contraception  NEXPLANON  HBsAg Negative (11/21 1553)  Circumcision  NA HIV Non Reactive (11/21 1553)  Pediatrician   UNSURE  HCVAb Non Reactive (11/21 1553)  Prenatal Classes            Pap       Diagnosis  Date Value Ref Range Status  02/19/2023     Final    - Negative for intraepithelial lesion or malignancy (NILM)    BTLConsent   GC/CT Initial:             36wks:  VBAC  Consent   GBS For PCN allergy, check sensitivities   Aspirin indicated?        DME Rx Galerius.Gant ] BP cuff [ ]  Weight Scale Waterbirth  [ ]  Class [ ]  Consent [ ]  CNM visit  PHQ9 & GAD7 [x]  new OB [x]  28 weeks  [  ] 36 weeks Induction  [ ]  Orders Entered     Past Medical History: Past Medical History:  Diagnosis Date   Anemia    Birth control counseling 01/18/2011   Hypothyroidism    Irregular uterine bleeding 04/24/2018   Lower abdominal pain 06/19/2021   Routine screening for STI (sexually transmitted infection) 06/09/2020   UTI (urinary tract infection)  Vaginal candidiasis 07/08/2019    Past Surgical History: Past Surgical History:  Procedure Laterality Date   NO PAST SURGERIES      Obstetrical History: OB History     Gravida  5   Para  1   Term  1   Preterm      AB  3   Living  1      SAB  1   IAB  2   Ectopic      Multiple  0   Live Births  1           Social History Social History   Socioeconomic History   Marital status: Single    Spouse name: Boyfriend - Christian   Number of children: 0   Years of education: 12   Highest education level: Not on file  Occupational History   Occupation: Research officer, political party: [employer]  Tobacco Use   Smoking status: Never    Passive exposure: Never   Smokeless tobacco: Never  Vaping Use   Vaping status: Never Used  Substance and Sexual Activity   Alcohol use: No   Drug use: No   Sexual activity: Not Currently  Other Topics Concern   Not on file  Social History Narrative   Erin Wilkins lives with her mother and older sister in an apartment. She plans to return to living there following delivery until she is financially stable. FOB  and boyfriend Erin Wilkins is involved and supportive. Shannette graduated from highschool spring 2016 and was working at [employer] prior to pregnancy. Plans to return to work following delivery, though at a different establishment.   Social Drivers of Corporate investment banker Strain: Not on file  Food Insecurity: Not on file  Transportation Needs: Not on file  Physical Activity: Not on file  Stress: Not on file  Social Connections: Not on file    Family History: Family History  Problem Relation Age of Onset   Hypertension Mother     Allergies: Allergies  Allergen Reactions   Cherry Flavoring Agent (Non-Screening) Anaphylaxis   Keflex  [Cephalexin ] Hives   Metronidazole  Nausea And Vomiting    To PO form (while pregnant)   Peanut (Diagnostic) Hives   Tylenol  [Acetaminophen ] Hives   Amoxicillin  Hives and Rash    Rxn about 1 yr ago   Ancef  [Cefazolin ] Rash    Perioral rash   Ibuprofen  Rash   Penicillins Hives and Rash    Has patient had a PCN reaction causing immediate rash, facial/tongue/throat swelling, SOB or lightheadedness with hypotension: No Has patient had a PCN reaction causing severe rash involving mucus membranes or skin necrosis: No Has patient had a PCN reaction that required hospitalization: No Has patient had a PCN reaction occurring within the last 10 years: Yes If all of the above answers are "NO", then may proceed with Cephalosporin use.     Medications Prior to Admission  Medication Sig Dispense Refill Last Dose/Taking   Prenatal Vit-Fe Phos-FA-Omega (VITAFOL  GUMMIES) 3.33-0.333-34.8 MG CHEW Chew 3 tablets by mouth daily before breakfast. 90 tablet 11 09/10/2023 Morning   cetirizine  (ZYRTEC  ALLERGY) 10 MG tablet Take 1 tablet (10 mg total) by mouth daily. (Patient not taking: Reported on 08/28/2023) 90 tablet 0 More than a month   EPINEPHrine  (EPIPEN  2-PAK) 0.3 mg/0.3 mL IJ SOAJ injection Inject 0.3 mg into the muscle as needed for anaphylaxis. 1 each 1 More than a  month   hydrocortisone  cream 0.5 % Apply 1  Application topically 2 (two) times daily. Apply to affected areas twice daily (Patient not taking: Reported on 09/09/2023) 56 g 1 More than a month   hydrOXYzine  (ATARAX ) 25 MG tablet Take 1 tablet (25 mg total) by mouth every 6 (six) hours as needed for itching. (Patient not taking: Reported on 08/28/2023) 30 tablet 2 More than a month   ondansetron  (ZOFRAN -ODT) 4 MG disintegrating tablet Take 1 tablet (4 mg total) by mouth every 6 (six) hours as needed for nausea. (Patient not taking: Reported on 08/28/2023) 3 tablet 0 More than a month   triamcinolone  ointment (KENALOG ) 0.5 % Apply 1 Application topically 2 (two) times daily. (Patient not taking: Reported on 09/09/2023) 30 g 0 More than a month     Review of Systems   All systems reviewed and negative except as stated in HPI  Blood pressure 112/72, pulse 73, temperature 98.6 F (37 C), temperature source Oral, resp. rate 18, height 5\' 1"  (1.549 m), weight 62.1 kg, last menstrual period 12/16/2022, SpO2 98%, unknown if currently breastfeeding. General appearance: alert, cooperative, and appears stated age Lungs: clear to auscultation bilaterally Heart: regular rate and rhythm Abdomen: soft, non-tender; bowel sounds normal Pelvic: normal female genitalia  Extremities: Homans sign is negative, no sign of DVT Presentation: cephalic Fetal monitoringBaseline: 135 bpm, Variability: Good {> 6 bpm), Accelerations: Reactive, and Decelerations: Absent Uterine activity q28mins      Prenatal labs: ABO, Rh: --/--/PENDING (05/06 0043) Antibody: PENDING (05/06 0043) Rubella: 1.85 (11/21 1553) RPR: Non Reactive (04/09 1108)  HBsAg: Negative (11/21 1553)  HIV: Non Reactive (04/09 1108)  GBS: Negative/-- (04/23 1604)    Lab Results  Component Value Date   GBS Negative 08/28/2023   GTT nrl  Genetic screening  LR female NIPS, Horizon neg x 4 Anatomy US  nrl   Immunization History  Administered Date(s)  Administered   H1N1 03/04/2008   Hepatitis A 07/04/2007   Hpv-Unspecified 07/04/2007, 01/16/2008   Influenza Split 05/17/2011, 03/25/2012   Influenza Whole 02/08/2009   Influenza,inj,Quad PF,6+ Mos 02/06/2013, 03/10/2015, 01/10/2016, 02/28/2017, 03/04/2018   Meningococcal polysaccharide vaccine (MPSV4) 07/04/2007   PPD Test 02/05/2017, 12/21/2019, 07/17/2021, 07/31/2023   Td 07/04/2007   Tdap 12/31/2014    Prenatal Transfer Tool  Maternal Diabetes: No Genetic Screening: Normal Maternal Ultrasounds/Referrals: Normal Fetal Ultrasounds or other Referrals:  None Maternal Substance Abuse:  No Significant Maternal Medications:  None Significant Maternal Lab Results: Group B Strep negative Number of Prenatal Visits:greater than 3 verified prenatal visits Maternal Vaccinations:declined Other Comments:  None   Results for orders placed or performed during the hospital encounter of 09/09/23 (from the past 24 hours)  Fern Test   Collection Time: 09/09/23 11:16 PM  Result Value Ref Range   POCT Fern Test Positive = ruptured amniotic membanes   Fern Test   Collection Time: 09/09/23 11:24 PM  Result Value Ref Range   POCT Fern Test Positive = ruptured amniotic membanes   Type and screen [facility]   Collection Time: 09/10/23 12:43 AM  Result Value Ref Range   ABO/RH(D) PENDING    Antibody Screen PENDING    Sample Expiration      09/13/2023,2359 Performed at [facility], 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 [id number] at [redacted]w[redacted]d here for SOL following SROM 2230 5/5.  #Labor: expectant management vs pitocin  augmentation if ctx space.  #Pain: Per patient request #FWB: Cat 1 #GBS status:  negative #Feeding: both  #Reproductive Life planning: Nexplanon   #Anaphylactic syndrome: cherry electrolyte energy drink  #Hyperthyroidism: s/p Endo consult on 3/7, thought to be euthyroid, no longer on methimazole , didn't follow up repeat thyroid  labs  -f/u admission thyroid  labs   #IDA: s/p venofer  OP -f/u admission hgb   Ebony Goldstein, MD  09/10/2023, 1:01 AM

## 2023-09-10 NOTE — MAU Note (Signed)

## 2023-09-10 NOTE — Anesthesia Preprocedure Evaluation (Signed)
 Anesthesia Evaluation  Patient identified by MRN, date of birth, ID band Patient awake    Reviewed: Allergy & Precautions, H&P , NPO status , Patient's Chart, lab work & pertinent test results, reviewed documented beta blocker date and time   Airway Mallampati: II  TM Distance: >3 FB Neck ROM: full    Dental no notable dental hx. 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  Reproductive/Obstetrics (+) Pregnancy                             Anesthesia Physical Anesthesia Plan  ASA: 3  Anesthesia Plan: Epidural   Post-op Pain Management: Minimal or no pain anticipated   Induction: Intravenous  PONV Risk Score and Plan: 2 and Treatment may vary due to age or medical condition  Airway Management Planned: Natural Airway  Additional Equipment: Fetal Monitoring  Intra-op Plan:   Post-operative Plan:   Informed Consent: I have reviewed the patients History and Physical, chart, labs and discussed the procedure including the risks, benefits and alternatives for the proposed anesthesia with the patient or authorized representative who has indicated his/her understanding and acceptance.     Dental advisory given  Plan Discussed with: Anesthesiologist  Anesthesia Plan Comments: (Patient identified.  Risks/Benefits/Options discussed with patient including but not limited to bleeding, infection, nerve damage, paralysis, failed block, incomplete pain control, headache, blood pressure changes, nausea, vomiting, reactions to medication both or allergic, itching and postpartum back pain. Confirmed with bedside nurse the patient's most recent platelet count. Confirmed with patient that they are not currently taking any anticoagulation, have any bleeding history or any family history of bleeding disorders. Patient expressed understanding and wished to proceed. All questions were answered. )        Anesthesia Quick Evaluation

## 2023-09-10 NOTE — Addendum Note (Signed)
 Addended by: Whitfield Hamper on: 09/10/2023 04:19 PM   Modules accepted: Orders

## 2023-09-10 NOTE — Lactation Note (Signed)
 This note was copied from a baby's chart. Lactation Consultation Note  Patient Name: Erin Wilkins [id number] Date: 09/10/2023 Age:28 hours Reason for consult: Initial assessment;Early term 37-38.6wks;Maternal endocrine disorder  P2- MOB plans to offer both breast milk and formula to infant. Infant had just finished 20 mL of formula as LC walked into the room, so LC was unable to assist with a latch. LC encouraged MOB to call for infant's next feeding so LC could perform a latch assessment. MOB reports that infant had latched once so far and it hurt. MOB reports that she had this problem with her first child as well. Her first child would make her nipples bleed all the time. MOB also reported that she was only able to breastfeed her first child for 3 months due to having high lipase in her milk. The child started refusing her milk. LC reviewed how to help make her milk more tasty for infant to take if she encounters high lipase again. (Scald the freshly expressed breast milk to 180 degrees and the cool it back down before storing.) LC encouraged MOB to seek help with our outpatient [facility] team if she feels like she is dealing with the same thing with this child. MOB had hyperthyroidism and is taking 5 mg of Methimazole . Per Dr. Gaynel Keel Medication and Lactation book, this drug is considered an L2 drug. An insert from this book states:  "In a large study of over 134 thyrotoxic lactating mothers and their infants, methimazole  therapy was initiated at 10-30 mg/day for 1 month, and reduced to 5-10 mg/day subsequently. Even at methimazole  does of 20 mg/day, no changes in infant TSH, T4, or T3 were noted in over 12 months of study. The authors conclude that both PTU and methimazole  can be safely administered during lactation."  LC reviewed the first 24 hr birthday nap, day 2 cluster feeding, feeding infant on cue 8-12x in 24 hrs, not allowing infant to go over 3 hrs without a feeding, CDC milk storage guidelines, LC  services handout and engorgement/breast care. LC encouraged MOB to call for further assistance as needed.  Maternal Data Does the patient have breastfeeding experience prior to this delivery?: Yes How long did the patient breastfeed?: 3 months  Feeding Mother's Current Feeding Choice: Breast Milk and Formula Nipple Type: Slow - flow  Lactation Tools Discussed/Used Pump Education: Milk Storage  Interventions Interventions: Breast feeding basics reviewed;Education;LC Services brochure  Discharge Discharge Education: Engorgement and breast care;Warning signs for feeding baby Pump: Referral sent for [facility] Pump  Consult Status Consult Status: Follow-up Date: 09/11/23 Follow-up type: In-patient    Vernette Goo BS, IBCLC 09/10/2023, 4:54 PM

## 2023-09-10 NOTE — Progress Notes (Signed)
 LABOR PROGRESS NOTE  Patient Name: Erin Wilkins, female   DOB: [birth year], 28 y.o.  MRN: [id number]  Cat 1 strip, making great progress, continue expectant management.   Ebony Goldstein, MD

## 2023-09-11 LAB — CERVICOVAGINAL ANCILLARY ONLY
Bacterial Vaginitis (gardnerella): POSITIVE — AB
Candida Glabrata: NEGATIVE
Candida Vaginitis: NEGATIVE
Chlamydia: NEGATIVE
Comment: NEGATIVE
Comment: NEGATIVE
Comment: NEGATIVE
Comment: NEGATIVE
Comment: NEGATIVE
Comment: NORMAL
Neisseria Gonorrhea: NEGATIVE
Trichomonas: NEGATIVE

## 2023-09-11 LAB — T3, FREE: T3, Free: 2.6 pg/mL (ref 2.0–4.4)

## 2023-09-11 NOTE — Anesthesia Postprocedure Evaluation (Signed)
 Anesthesia Post Note  Patient: Erin Wilkins  Procedure(s) Performed: AN AD HOC LABOR EPIDURAL     Patient location during evaluation: Mother Baby Anesthesia Type: Epidural Level of consciousness: awake and alert Pain management: pain level controlled Vital Signs Assessment: post-procedure vital signs reviewed and stable Respiratory status: spontaneous breathing, nonlabored ventilation and respiratory function stable Cardiovascular status: stable Postop Assessment: no headache, no backache and epidural receding Anesthetic complications: no   No notable events documented.  Last Vitals:  Vitals:   09/10/23 2330 09/11/23 0500  BP: 117/64 105/61  Pulse: 64 74  Resp: 18 18  Temp: 37 C 36.6 C  SpO2: 100% 99%    Last Pain:  Vitals:   09/11/23 0500  TempSrc: Oral  PainSc: 0-No pain   Pain Goal:                   Erin Wilkins

## 2023-09-11 NOTE — Patient Instructions (Signed)
 Your appointment with Outpatient Lactation is: Date: May 27,2025 Time: 10:00 am [facility] (First Floor) [address] [place]  Check in under baby's name.  Please bring your baby hungry along with your pump and a bottle of either formula or expressed breast milk. Please also bring your pump flanges and we welcome support people! If you need lactation assistance before your appointment, please call [phone] and press 4 for lactation.

## 2023-09-11 NOTE — Progress Notes (Signed)
 POSTPARTUM PROGRESS NOTE  Subjective: Erin Wilkins is a 28 y.o. [id number] s/p NSVD at [redacted]w[redacted]d.  She reports she is doing well. No acute events overnight. She denies any problems with ambulating, voiding or po intake. Denies nausea or vomiting. She has passed flatus. Pain is well controlled.  Lochia is moderate.  Objective: Blood pressure 105/61, pulse 74, temperature 97.8 F (36.6 C), temperature source Oral, resp. rate 18, height 5\' 1"  (1.549 m), weight 62.1 kg, last menstrual period 12/16/2022, SpO2 99%, unknown if currently breastfeeding.  Physical Exam:  General: alert, cooperative and no distress Chest: no respiratory distress Abdomen: soft, non-tender  Uterine Fundus: firm and at level of umbilicus Extremities: No calf swelling or tenderness  Trace edema  Recent Labs    09/10/23 0043  HGB 9.5*  HCT 29.0*    Assessment/Plan: Shekelia Agopian is a 28 y.o. [id number] s/p NSVD at [redacted]w[redacted]d.  Routine Postpartum Care: Doing well, pain well-controlled.  -- Continue routine care, lactation support  -- Contraception: Nexplanon  OP -- Feeding: Both  Dispo: Plan for discharge 5/8.  Maud Sorenson, MD OB Fellow 09/11/2023 10:57 AM

## 2023-09-11 NOTE — Lactation Note (Signed)
 This note was copied from a baby's chart. Lactation Consultation Note  Patient Name: Girl Romina Pulis ZOXWR'U Date: 09/11/2023 Age:28 hours Reason for consult: Follow-up assessment;Early term 37-38.6wks;Maternal endocrine disorder;Breastfeeding assistance  P2- MOB reports that infant has been nursing very well and taking the formula well. It had been almost 3 hrs since infant last ate, so LC offered to assist with a latch. MOB consented. LC placed infant on the left breast in the football hold. Infant latched immediately. Infant had a strong rhythmic suck with flanged lips. Infant nursed for 15 minutes before falling asleep. MOB then proceeded to offer formula due to being DAT+ and not wanting infant to be placed under lights. MOB denies having any questions or concerns at this time. LC reviewed day 2 cluster feeding and what to expect. LC encouraged MOB to call for further assistance as needed.  Maternal Data Has patient been taught Hand Expression?: Yes Does the patient have breastfeeding experience prior to this delivery?: Yes  Feeding Mother's Current Feeding Choice: Breast Milk and Formula Nipple Type: Slow - flow  LATCH Score Latch: Grasps breast easily, tongue down, lips flanged, rhythmical sucking.  Audible Swallowing: Spontaneous and intermittent  Type of Nipple: Everted at rest and after stimulation  Comfort (Breast/Nipple): Soft / non-tender  Hold (Positioning): Assistance needed to correctly position infant at breast and maintain latch.  LATCH Score: 9   Lactation Tools Discussed/Used Pump Education: Milk Storage  Interventions Interventions: Breast feeding basics reviewed;Assisted with latch;Hand express;Breast compression;Adjust position;Support pillows;Position options;Education;LC Services brochure  Discharge Discharge Education: Engorgement and breast care Pump: Received Stork Pump  Consult Status Consult Status: Follow-up Date: 09/12/23 Follow-up type:  In-patient    Vernette Goo BS, IBCLC 09/11/2023, 6:47 PM

## 2023-09-12 ENCOUNTER — Ambulatory Visit

## 2023-09-12 MED ORDER — CYCLOBENZAPRINE HCL 5 MG PO TABS: 5.0000 mg | ORAL_TABLET | Freq: Three times a day (TID) | ORAL | 0 refills | Status: DC | PRN

## 2023-09-12 MED ORDER — SENNOSIDES-DOCUSATE SODIUM 8.6-50 MG PO TABS: 2.0000 | ORAL_TABLET | Freq: Every day | ORAL | 0 refills | Status: DC

## 2023-09-12 MED ORDER — FERROUS SULFATE 325 (65 FE) MG PO TABS: 325.0000 mg | ORAL_TABLET | ORAL | 0 refills | Status: AC

## 2023-09-12 MED ORDER — CYCLOBENZAPRINE HCL 10 MG PO TABS: 5.0000 mg | ORAL_TABLET | Freq: Once | ORAL | Status: DC

## 2023-09-12 NOTE — Lactation Note (Signed)
 This note was copied from a baby's chart. Lactation Consultation Note  Patient Name: Erin Wilkins ZOXWR'U Date: 09/12/2023 Age:28 hours Reason for consult: Follow-up assessment;Maternal discharge;Early term 37-38.6wks  P2, 38 wks, @ 43 hrs of life. Mom formula and breast by choice. Mom feeding formula with LC arrival. Per mom breasts heavy/filling. Encouraged breasts respond faster/easier with each pregnancy. Important to move milk every 3 hrs. Hand pump provided- highlighted as best way to move milk from swollen/overfull breast.  Encouraged mom to keep working on big mouth latch with baby and use EBM or coconut oil after each feed. Discussed cluster feeding overnight/ early morning brings in our milk supply, shared expectations of milk coming in. Highlighted risk of engorgement. Discussed hand pump/express to soften breasts, motrin  as anti-inflammatory, and ice packs for 10-20 minutes post feed/pumping if still over-full is the best treatments for inflamed/engorged breasts.    Maternal Data Does the patient have breastfeeding experience prior to this delivery?: Yes How long did the patient breastfeed?: 3 months  Feeding Mother's Current Feeding Choice: Breast Milk and Formula  Lactation Tools Discussed/Used Breast pump type: Manual Pump Education: Milk Storage  Interventions Interventions: Hand express;Breast compression;Hand pump;Education;LC Services brochure;CDC milk storage guidelines  Discharge Discharge Education: Engorgement and breast care Pump: Received Stork Pump;Manual;Personal  Consult Status Consult Status: Complete Date: 09/12/23 Follow-up type: In-patient    Erin Wilkins 09/12/2023, 7:53 AM

## 2023-09-12 NOTE — Progress Notes (Signed)
 Patient ID: Erin Wilkins, female   DOB: 03-Jan-1996, 28 y.o.   MRN: [id number]  Post Partum Day Two :S/P SVD  Subjective: Patient up ad lib, denies syncope or dizziness. Reports consuming regular diet without issues and denies N/V. Denies issues with urination and reports bleeding is "like a period."  Patient is breastfeeding and reports going alright, but notes latch has been difficult when breast is full.  Desires Nexplanon  for postpartum contraception.  Pain is being appropriately managed with use of oxycodone .  However, patient reports this medication causes fatigue and this is not desirable for her.   Objective: Vitals:   09/11/23 0500 09/11/23 1415 09/11/23 2117 09/12/23 0524  BP: 105/61 112/64 105/63 113/84  Pulse: 74 70 92 78  Resp: 18 18 17 18   Temp: 97.8 F (36.6 C) 97.9 F (36.6 C) 98.4 F (36.9 C) 98.3 F (36.8 C)  TempSrc: Oral Oral Oral Oral  SpO2: 99% 100% 100% 100%  Weight:      Height:       Recent Labs    09/10/23 0043  HGB 9.5*  HCT 29.0*    Physical Exam:  General: alert, cooperative, and no distress Mood/Affect: Appropriate/Bright Lungs: clear to auscultation, no wheezes, rales or rhonchi, symmetric air entry.  Heart: normal rate and regular rhythm. Breast: not examined. Abdomen:  + bowel sounds, NT Uterine Fundus: Firm at umbilicus Lochia: appropriate Laceration: None Skin: Warm, Dry DVT Evaluation: No evidence of DVT seen on physical exam. No significant calf/ankle edema.  Assessment S/P Vaginal Delivery-Day Two Normal Involution BreastFeeding Tylenol  and Ibuprofen  Allergy Asymptomatic Anemia  Plan: -Discussed usage of flexeril for abdominal cramping. Patient agreeable. Will give initial dose here and limited rx sent.  -Reviewed anemia and iron  supplementation. -Planning outpatient Nexplanon  -Continue current care. -L&D team to be updated on patient status.   Kraig Peru, MSN, CNM 09/12/2023, 10:28 AM

## 2023-09-24 ENCOUNTER — Ambulatory Visit

## 2023-09-26 ENCOUNTER — Ambulatory Visit: Admitting: Student

## 2023-09-26 ENCOUNTER — Telehealth: Payer: Self-pay

## 2023-09-26 NOTE — Telephone Encounter (Signed)
 Patient calls nurse line regarding vaginal odor. She is 2 weeks post partum. She reports that bleeding has stopped, however, has brown discharge with odor.   She denies fever, chills, pelvic/abdominal or back pain. Denies painful urination.   She reports that when she had baby that she had tested positive for BV, however, did not receive treatment.   Scheduled for office visit this afternoon for further evaluation.   Elsie Halo, RN

## 2023-09-27 ENCOUNTER — Ambulatory Visit: Admitting: Student

## 2023-10-01 ENCOUNTER — Telehealth (HOSPITAL_COMMUNITY): Payer: Self-pay | Admitting: *Deleted

## 2023-10-01 NOTE — Telephone Encounter (Signed)
 10/01/2023  Name: Erin Wilkins MRN: [id number] DOB: 07-13-1995  Reason for Call:  Transition of Care Hospital Discharge Call  Contact Status: Patient Contact Status: Complete  Language assistant needed: Interpreter Mode: Interpreter Not Needed        Follow-Up Questions: Do You Have Any Concerns About Your Health As You Heal From Delivery?: No Do You Have Any Concerns About Your Infants Health?: Yes What Concerns Do You Have About Your Baby?: Patient reports she feels that baby is constipated because she seems fussy and grunts when trying to have a bowel movement. Mother reports that infant's stools are soft and she has one almost with every feeding, or at least 4-5 a day. Reassured patient that infant is having the appropriate number of stools and that she should be worried about constipation if they are hard or pebbly. Patient has been having trouble latching the last couple of days but states she has a lactation appoinment today.  Edinburgh Postnatal Depression Scale:  In the Past 7 Days:    PHQ2-9 Depression Scale:     Discharge Follow-up: Edinburgh score requires follow up?: N/A (Patient states that she just completed EPDS last week with a provider, and states that she is doing well emotionally. Patient states that her score is about the same as it was in the hospital.) Patient was advised of the following resources:: Support Group, Breastfeeding Support Group  Post-discharge interventions: Reviewed Newborn Safe Sleep Practices  Starleen Eastern, [place] 10/01/2023 [phone]

## 2023-10-02 ENCOUNTER — Ambulatory Visit: Admitting: "Endocrinology

## 2023-10-03 ENCOUNTER — Encounter: Payer: Self-pay | Admitting: Lactation Services

## 2023-10-04 ENCOUNTER — Encounter: Payer: Self-pay | Admitting: Student

## 2023-10-04 ENCOUNTER — Ambulatory Visit (INDEPENDENT_AMBULATORY_CARE_PROVIDER_SITE_OTHER): Admitting: Student

## 2023-10-04 VITALS — BP 120/62 | HR 81 | Ht 61.0 in | Wt 119.8 lb

## 2023-10-04 DIAGNOSIS — N760 Acute vaginitis: Secondary | ICD-10-CM

## 2023-10-04 DIAGNOSIS — B9689 Other specified bacterial agents as the cause of diseases classified elsewhere: Secondary | ICD-10-CM

## 2023-10-04 MED ORDER — METRONIDAZOLE 0.75 % VA GEL: 1.0000 | Freq: Every day | VAGINAL | 0 refills | Status: AC

## 2023-10-04 NOTE — Patient Instructions (Addendum)
 It was great seeing you today.  Continue to avoid sexual intercourse until 6 weeks after delivery. Metronidazole  vaginal gel sent to pharmacy to treat bacterial vaginosis  I like the brand "Cora" for pads- they are unscented  Change pads frequently: Change sanitary pads often, especially during the heavy bleeding phase.  Avoid tampons: Don't use tampons for the first 6 weeks after delivery.  Keep the perineal area clean: Wash the area gently with warm water after using the restroom.   If you have any questions or concerns, please feel free to call the clinic.   Have a wonderful day,  Dr. Vallorie Gayer [facility] [phone]

## 2023-10-04 NOTE — Progress Notes (Signed)
    SUBJECTIVE:   CHIEF COMPLAINT / HPI:   Vaginal odor S/p SVD on 09/09/2023, 1st degree perineal laceration Uncomplicated postpartum course Plan for nexplanon  at postpartum visit  White-ish, clear discharge Has not engaged in sexual intercourse Noticed a foul odor, fishy odor No pain, itching  Bonding well with baby Denies any depressive or anxious symptoms Breastfeeding once per day  + BV on 09/10/2023, not treated  OBJECTIVE:   BP 120/62   Pulse 81   Ht 5\' 1"  (1.549 m)   Wt 119 lb 12.8 oz (54.3 kg)   LMP 12/16/2022 (Exact Date)   SpO2 95%   BMI 22.64 kg/m   General: NAD, well appearing Cardiac: RRR Neuro: A&O Respiratory: normal WOB on RA.  Extremities: Moving all 4 extremities equally  ASSESSMENT/PLAN:   BV (bacterial vaginosis) Treat with metro-gel 0.75% vaginal gel for 5 days at bedtime Discussed expected postpartum lochia course Abstain from sexual intercourse until 6 weeks postpartum     Vallorie Gayer, DO [facility]

## 2023-10-04 NOTE — Assessment & Plan Note (Signed)
 Treat with metro-gel 0.75% vaginal gel for 5 days at bedtime Discussed expected postpartum lochia course Abstain from sexual intercourse until 6 weeks postpartum

## 2023-10-15 ENCOUNTER — Encounter: Payer: Self-pay | Admitting: *Deleted

## 2023-10-22 ENCOUNTER — Ambulatory Visit: Admitting: Advanced Practice Midwife

## 2023-10-31 ENCOUNTER — Ambulatory Visit: Admitting: Obstetrics and Gynecology

## 2023-11-05 ENCOUNTER — Ambulatory Visit: Admitting: Obstetrics and Gynecology

## 2023-11-26 ENCOUNTER — Ambulatory Visit (INDEPENDENT_AMBULATORY_CARE_PROVIDER_SITE_OTHER)

## 2023-11-26 ENCOUNTER — Ambulatory Visit

## 2023-11-26 ENCOUNTER — Other Ambulatory Visit (HOSPITAL_COMMUNITY)
Admission: RE | Admit: 2023-11-26 | Discharge: 2023-11-26 | Disposition: A | Discharge status: Home / Self Care | Source: Ambulatory Visit | Attending: Family Medicine | Admitting: Family Medicine

## 2023-11-26 VITALS — BP 110/70 | HR 64 | Ht 61.0 in | Wt 118.0 lb

## 2023-11-26 DIAGNOSIS — N898 Other specified noninflammatory disorders of vagina: Secondary | ICD-10-CM | POA: Diagnosis not present

## 2023-11-26 DIAGNOSIS — R399 Unspecified symptoms and signs involving the genitourinary system: Secondary | ICD-10-CM

## 2023-11-26 DIAGNOSIS — Z113 Encounter for screening for infections with a predominantly sexual mode of transmission: Secondary | ICD-10-CM

## 2023-11-26 LAB — POCT WET PREP (WET MOUNT)
Clue Cells Wet Prep Whiff POC: NEGATIVE
Trichomonas Wet Prep HPF POC: ABSENT
WBC, Wet Prep HPF POC: >20

## 2023-11-26 LAB — POCT URINALYSIS DIP (MANUAL ENTRY)
Bilirubin, UA: NEGATIVE
Glucose, UA: NEGATIVE mg/dL
Ketones, POC UA: NEGATIVE mg/dL
Nitrite, UA: NEGATIVE
Protein Ur, POC: >=300 mg/dL — AB
Spec Grav, UA: >=1.03 — AB (ref 1.010–1.025)
Urobilinogen, UA: 0.2 U/dL
pH, UA: 6 (ref 5.0–8.0)

## 2023-11-26 LAB — POCT UA - MICROSCOPIC ONLY
RBC, Urine, Miroscopic: >20 (ref 0–2)
WBC, Ur, HPF, POC: >20 (ref 0–5)

## 2023-11-26 MED ORDER — NITROFURANTOIN MONOHYD MACRO 100 MG PO CAPS: 100.0000 mg | ORAL_CAPSULE | Freq: Two times a day (BID) | ORAL | 0 refills | Status: DC

## 2023-11-26 NOTE — Progress Notes (Cosign Needed Addendum)
    SUBJECTIVE:   CHIEF COMPLAINT / HPI:    Patient reports the following symptoms: dysuria, urinary frequency, and urinary odor and cloudy urine and incomplete emptying Duration: 2 days None of the following: hematuria LMP 11/26/23  BV  Treatment for BV right after without return of normal vaginal odor.  Increased discharge since delivery No new sexual partners, but patient interested in STI testing today Was not able to finish metronidogel Not currently breastfeeding  PERTINENT  PMH / PSH: none  OBJECTIVE:   Ht 5' 1 (1.549 m)   Wt 118 lb (53.5 kg)   LMP 11/26/2023 (Exact Date)   Breastfeeding No   BMI 22.30 kg/m    General: Well appearing and alert patient. No acute distress.  Genital/Rectal: External genitalia is normal in appearance without lesions, swelling, masses or tenderness. No palpable lymph nodes. Vagina is pink and moist without lesions or discharge. Cervix without lesions or erosions. Mild creamy white discharge present throughout the vaginal vault. Mild blood near the cervix consistent with current menses. Chaperoned by Harlene Carte RN  ASSESSMENT/PLAN:   Assessment & Plan Urinary tract infection symptoms UA with small leukocytes, reflex to culture.  Prescribed macrobid  100 mg BID. Will adjust based on culture results. Routine screening for STI (sexually transmitted infection) Ordered GC/chlam, HIV, and RPR Vaginal discharge Wet prep was negative. Will follow STI results and treat accordingly.    Gadiel John Alena Morrison, MD [facility]

## 2023-11-26 NOTE — Patient Instructions (Signed)
 It was wonderful to see you today.  Please bring ALL of your medications with you to every visit.   Today we talked about:  I have sent an antibiotic to your pharmacy. Take this two times a day for five days.  For your blood work, I will send you a MyChart message if the results are abnormal.  Thank you for choosing [facility].   Please call [phone] with any questions about today's appointment.  Please arrive at least 15 minutes prior to your scheduled appointments.   If you need additional refills before your next appointment, please call your pharmacy first.   Charda Janis Alena Morrison, MD  [facility]

## 2023-11-27 LAB — HIV ANTIBODY (ROUTINE TESTING W REFLEX): HIV Screen 4th Generation wRfx: NONREACTIVE

## 2023-11-27 LAB — RPR: RPR Ser Ql: NONREACTIVE

## 2023-11-28 ENCOUNTER — Telehealth: Payer: Self-pay

## 2023-11-28 LAB — GC/CHLAMYDIA PROBE AMP (~~LOC~~) NOT AT ARMC
Chlamydia: NEGATIVE
Comment: NEGATIVE
Comment: NORMAL
Neisseria Gonorrhea: NEGATIVE

## 2023-11-28 NOTE — Telephone Encounter (Signed)
 Patient calls nurse line in regards to Macrobid .   She reports she was only given #6 capsules by the pharmacy. She reports she was just able to pick the prescription up today.   She reports continued dysuria, cloudy urine and abnormal odor. She denies any worsening symptoms.   She denies any fevers, chills, hematuria, abdominal pain, nausea or vomiting.   I called the pharmacy. They report she has not picked this medication up yet. They could not confirm if she had script transferred.   I attempted to call patient back to discuss further, however no answer or option for VM.

## 2024-01-21 ENCOUNTER — Ambulatory Visit

## 2024-01-24 ENCOUNTER — Ambulatory Visit

## 2024-02-05 ENCOUNTER — Ambulatory Visit

## 2024-02-07 ENCOUNTER — Ambulatory Visit

## 2024-02-17 ENCOUNTER — Ambulatory Visit (INDEPENDENT_AMBULATORY_CARE_PROVIDER_SITE_OTHER): Admitting: Family Medicine

## 2024-02-17 VITALS — BP 119/79 | HR 105 | Wt 120.0 lb

## 2024-02-17 DIAGNOSIS — D509 Iron deficiency anemia, unspecified: Secondary | ICD-10-CM

## 2024-02-17 DIAGNOSIS — M79661 Pain in right lower leg: Secondary | ICD-10-CM | POA: Diagnosis not present

## 2024-02-17 DIAGNOSIS — Z111 Encounter for screening for respiratory tuberculosis: Secondary | ICD-10-CM | POA: Diagnosis not present

## 2024-02-17 NOTE — Progress Notes (Signed)
   SUBJECTIVE:   CHIEF COMPLAINT / HPI:  Discussed the use of AI scribe software for clinical note transcription with the patient, who gave verbal consent to proceed.  History of Present Illness Erin Wilkins is a 28 year old female who presents with leg pain and for a TB test required for work.  She experiences right leg pain described as a tightness or squeezing sensation in the calf muscle during walking. This pain began during pregnancy and is intermittent, occurring with weight-bearing activities but absent when sitting. The pain is cramp-like with no swelling, redness, warmth, or history of blood clots. There is no associated fever, difficulty breathing, or chest pain. She has not taken pain medication due to allergies.  Her family history includes high blood pressure in her mother and blood clots in her father, who also had an unspecified heart problem.  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  PERTINENT  PMH / PSH: iron  deficiency anemia in pregnancy  OBJECTIVE:  BP 119/79   Pulse (!) 105   Wt 120 lb (54.4 kg)   SpO2 100%   BMI 22.67 kg/m   Physical Exam GENERAL: Alert, cooperative, well developed, no acute distress. HEENT: Normocephalic, normal oropharynx, moist mucous membranes. CHEST: Breathing and speaking comfortably on exam EXTREMITIES: No cyanosis or edema. Calf normal, no warmth or swelling. 2+ DP pulse on the right. MUSCULOSKELETAL: Knees non-tender to palpation. Thessaly test negative. Mild limp when walking. NEUROLOGICAL: Cranial nerves grossly intact, moves all other extremities without gross motor or sensory deficit.  ASSESSMENT/PLAN:  Assessment and Plan Assessment & Plan Right leg pain   Intermittent pain in the right calf worsens with weight-bearing. Differential diagnosis includes blood clot, fracture, tendonitis. Order an X-ray to assess for a stress fracture or other structural issues. Will also order D-dimer and, if elevated, will  proceed with DVT US  (though reassuringly without CP, SOB and only mild tachycardia likely due to being checked immediately once roomed).  IDA Update iron  studies and CBC today.  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 Quantiferon-GOLD collected today.  Stuart Redo, MD [facility]

## 2024-02-17 NOTE — Patient Instructions (Addendum)
 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 The pain in your right calf occurs during walking and weight-bearing activities but is not present when sitting.  YOUR PLAN: RIGHT LEG PAIN: You have intermittent pain in your right calf that worsens with weight-bearing activities. This pain started during your pregnancy and feels like a tightness or squeezing sensation. -We will order an X-ray to check for a stress fracture or other structural issues. -Will also order a test to see if you may have a blood clot.  Please let me know if you have any other questions.  Dr. Tharon

## 2024-02-18 ENCOUNTER — Ambulatory Visit: Payer: Self-pay | Admitting: Family Medicine

## 2024-02-18 LAB — D-DIMER, QUANTITATIVE: D-DIMER: 0.22 mg{FEU}/L (ref 0.00–0.49)

## 2024-02-20 LAB — CBC WITH DIFFERENTIAL/PLATELET
Basophils Absolute: 0 x10E3/uL (ref 0.0–0.2)
Basos: 1 %
EOS (ABSOLUTE): 0.1 x10E3/uL (ref 0.0–0.4)
Eos: 1 %
Hematocrit: 38.7 % (ref 34.0–46.6)
Hemoglobin: 12.5 g/dL (ref 11.1–15.9)
Immature Grans (Abs): 0 x10E3/uL (ref 0.0–0.1)
Immature Granulocytes: 0 %
Lymphocytes Absolute: 2.7 x10E3/uL (ref 0.7–3.1)
Lymphs: 31 %
MCH: 30.7 pg (ref 26.6–33.0)
MCHC: 32.3 g/dL (ref 31.5–35.7)
MCV: 95 fL (ref 79–97)
Monocytes Absolute: 0.5 x10E3/uL (ref 0.1–0.9)
Monocytes: 6 %
Neutrophils Absolute: 5.3 x10E3/uL (ref 1.4–7.0)
Neutrophils: 61 %
Platelets: 293 x10E3/uL (ref 150–450)
RBC: 4.07 x10E6/uL (ref 3.77–5.28)
RDW: 13.8 % (ref 11.7–15.4)
WBC: 8.6 x10E3/uL (ref 3.4–10.8)

## 2024-02-20 LAB — FERRITIN: Ferritin: 42 ng/mL (ref 15–150)

## 2024-02-20 LAB — IRON AND TIBC
Iron Saturation: 24 % (ref 15–55)
Iron: 70 ug/dL (ref 27–159)
Total Iron Binding Capacity: 297 ug/dL (ref 250–450)
UIBC: 227 ug/dL (ref 131–425)

## 2024-02-20 LAB — QUANTIFERON-TB GOLD PLUS
QuantiFERON Mitogen Value: >10 [IU]/mL
QuantiFERON Nil Value: 0.02 [IU]/mL
QuantiFERON TB1 Ag Value: 0.04 [IU]/mL
QuantiFERON TB2 Ag Value: 0.03 [IU]/mL

## 2024-04-14 ENCOUNTER — Ambulatory Visit: Admitting: Family Medicine

## 2024-04-14 VITALS — BP 128/72 | HR 105 | Ht 61.0 in | Wt 130.8 lb

## 2024-04-14 DIAGNOSIS — Z3201 Encounter for pregnancy test, result positive: Secondary | ICD-10-CM | POA: Diagnosis not present

## 2024-04-14 LAB — POCT HEMOGLOBIN: Hemoglobin: 11.3 g/dL (ref 11–14.6)

## 2024-04-14 LAB — POCT URINE PREGNANCY: Preg Test, Ur: POSITIVE — AB

## 2024-04-14 NOTE — Patient Instructions (Signed)
 It was great to see you today! Thank you for choosing [facility] for your primary care. Erin Wilkins was seen for positive pregnancy test.  Today we addressed: I recommend you go to the MAU to get a ultrasound to rule in or out intrauterine pregnancy vs ectopic pregnancy with your recurrent episodes of bleeding and positive pregnancy tests x2.  For any pregnancy-related emergencies, please go to the Maternity Admissions Unit in the [facility] at [facility]. You will use hospital Entrance C.    You should return to our clinic No follow-ups on file. Please arrive 15 minutes before your appointment to ensure smooth check in process.  We appreciate your efforts in making this happen.  Thank you for allowing me to participate in your care, Erin Melena, DO 04/14/2024, 11:54 AM PGY-2, [facility]

## 2024-04-14 NOTE — Progress Notes (Signed)
    SUBJECTIVE:   CHIEF COMPLAINT / HPI:   Positive Pregnancy Test  Bleeding - Last regular menstrual period on 9/19, had intercourse on 9/24 and took Plan B 9/24, hasn't had her regular cycle since. - Bleeding began mid October and persists monthly at middle of the month (very heavy at the beginning with dark red w/ clots, but then becomes brown) - Intermittent bleeding since September and off of her regular cycle possibly because of the Plan B, but is bleeding for 3-5 days per month, but bleeding for about 5 days currently (brown) - Positive pregnancy test at home on 12/7 - Pain in the L lower abdomen that radiates down the front her of leg and into her lower back - Denies any fevers or large clots  PERTINENT  PMH / PSH: Reviewed  OBJECTIVE:   BP 128/72   Pulse (!) 105   Ht 5' 1 (1.549 m)   Wt 130 lb 12.8 oz (59.3 kg)   SpO2 97%   BMI 24.71 kg/m   General: Awake and Alert in NAD HEENT: NCAT. Sclera anicteric. No rhinorrhea. Cardiovascular: Tachycardic. No M/R/G Respiratory: CTAB, normal WOB on RA. No wheezing, crackles, rhonchi, or diminished breath sounds. Abdomen: Soft, mild TTP over umbilicus, non-distended Extremities: Able to move all extremities. Skin: Warm and dry. No abrasions or rashes noted. Neuro: A&Ox3. No focal neurological deficits.  F GU: Normally developed genitalia with no external lesions or eruptions. Vagina and cervix with ectropion noted and copious light brown discharge with mild prolapse. Chaperoned by CMA Stacey Reaves.   ASSESSMENT/PLAN:   Assessment & Plan Positive pregnancy test Positive pregnancy test x 2 (at home and in office today).  With recurrent episodes of bleeding noted over the last 3 months and increased brown discharge today on exam with ectropion noted on cervix, will send patient over to the MAU to get an ultrasound to rule in vs out intrauterine vs ectopic pregnancy.  - POC Hemoglobin 11.3 - Advised patient to go to the MAU to  get an US  to r/o intrauterine vs ectopic pregnancy w/ possible dating if applicable to determine next steps/options   Kathrine Melena, DO [facility]

## 2024-05-04 ENCOUNTER — Encounter (HOSPITAL_COMMUNITY): Payer: Self-pay | Admitting: Obstetrics and Gynecology

## 2024-05-04 ENCOUNTER — Inpatient Hospital Stay (HOSPITAL_COMMUNITY)
Admission: AD | Admit: 2024-05-04 | Discharge: 2024-05-04 | Disposition: A | Discharge status: Home / Self Care | Attending: Obstetrics & Gynecology | Admitting: Obstetrics & Gynecology

## 2024-05-04 ENCOUNTER — Inpatient Hospital Stay (HOSPITAL_COMMUNITY)

## 2024-05-04 DIAGNOSIS — O26892 Other specified pregnancy related conditions, second trimester: Secondary | ICD-10-CM | POA: Diagnosis not present

## 2024-05-04 DIAGNOSIS — O09892 Supervision of other high risk pregnancies, second trimester: Secondary | ICD-10-CM | POA: Insufficient documentation

## 2024-05-04 DIAGNOSIS — O209 Hemorrhage in early pregnancy, unspecified: Secondary | ICD-10-CM | POA: Insufficient documentation

## 2024-05-04 DIAGNOSIS — R1022 Pelvic and perineal pain left side: Secondary | ICD-10-CM | POA: Diagnosis not present

## 2024-05-04 DIAGNOSIS — Z3A14 14 weeks gestation of pregnancy: Secondary | ICD-10-CM

## 2024-05-04 DIAGNOSIS — B9689 Other specified bacterial agents as the cause of diseases classified elsewhere: Secondary | ICD-10-CM | POA: Diagnosis not present

## 2024-05-04 DIAGNOSIS — O4692 Antepartum hemorrhage, unspecified, second trimester: Secondary | ICD-10-CM

## 2024-05-04 DIAGNOSIS — Z113 Encounter for screening for infections with a predominantly sexual mode of transmission: Secondary | ICD-10-CM | POA: Diagnosis present

## 2024-05-04 DIAGNOSIS — R1032 Left lower quadrant pain: Secondary | ICD-10-CM | POA: Diagnosis present

## 2024-05-04 DIAGNOSIS — O23592 Infection of other part of genital tract in pregnancy, second trimester: Secondary | ICD-10-CM | POA: Diagnosis not present

## 2024-05-04 DIAGNOSIS — Z3201 Encounter for pregnancy test, result positive: Secondary | ICD-10-CM | POA: Insufficient documentation

## 2024-05-04 LAB — ABO/RH: ABO/RH(D): O POS

## 2024-05-04 LAB — URINALYSIS, ROUTINE W REFLEX MICROSCOPIC
Bilirubin Urine: NEGATIVE
Glucose, UA: NEGATIVE mg/dL
Hgb urine dipstick: NEGATIVE
Ketones, ur: NEGATIVE mg/dL
Leukocytes,Ua: NEGATIVE
Nitrite: NEGATIVE
Protein, ur: NEGATIVE mg/dL
Specific Gravity, Urine: 1.021 (ref 1.005–1.030)
pH: 6 (ref 5.0–8.0)

## 2024-05-04 LAB — COMPREHENSIVE METABOLIC PANEL WITH GFR
ALT: 6 U/L (ref 0–44)
AST: 14 U/L — ABNORMAL LOW (ref 15–41)
Albumin: 3.7 g/dL (ref 3.5–5.0)
Alkaline Phosphatase: 53 U/L (ref 38–126)
Anion gap: 13 (ref 5–15)
BUN: 15 mg/dL (ref 6–20)
CO2: 18 mmol/L — ABNORMAL LOW (ref 22–32)
Calcium: 8.7 mg/dL — ABNORMAL LOW (ref 8.9–10.3)
Chloride: 104 mmol/L (ref 98–111)
Creatinine, Ser: 0.52 mg/dL (ref 0.44–1.00)
GFR, Estimated: >60 mL/min
Glucose, Bld: 82 mg/dL (ref 70–99)
Potassium: 4.1 mmol/L (ref 3.5–5.1)
Sodium: 134 mmol/L — ABNORMAL LOW (ref 135–145)
Total Bilirubin: 0.3 mg/dL (ref 0.0–1.2)
Total Protein: 6.9 g/dL (ref 6.5–8.1)

## 2024-05-04 LAB — WET PREP, GENITAL
Sperm: NONE SEEN
Trich, Wet Prep: NONE SEEN
WBC, Wet Prep HPF POC: <10
Yeast Wet Prep HPF POC: NONE SEEN

## 2024-05-04 LAB — CBC
HCT: 34.4 % — ABNORMAL LOW (ref 36.0–46.0)
Hemoglobin: 11.6 g/dL — ABNORMAL LOW (ref 12.0–15.0)
MCH: 31.5 pg (ref 26.0–34.0)
MCHC: 33.7 g/dL (ref 30.0–36.0)
MCV: 93.5 fL (ref 80.0–100.0)
Platelets: 253 K/uL (ref 150–400)
RBC: 3.68 MIL/uL — ABNORMAL LOW (ref 3.87–5.11)
RDW: 13.3 % (ref 11.5–15.5)
WBC: 9.7 K/uL (ref 4.0–10.5)
nRBC: 0 % (ref 0.0–0.2)

## 2024-05-04 LAB — HCG, QUANTITATIVE, PREGNANCY: hCG, Beta Chain, Quant, S: 41859 m[IU]/mL — ABNORMAL HIGH

## 2024-05-04 MED ORDER — METRONIDAZOLE 0.75 % EX GEL: 1.0000 | Freq: Every day | CUTANEOUS | 0 refills | Status: AC

## 2024-05-04 NOTE — MAU Provider Note (Signed)
 " History     CSN: [id number]  Arrival date and time: 05/04/24 8158 First Provider Initiated Contact with Patient 05/04/2024  7:30 PM  Chief Complaint  Patient presents with   Abdominal Pain   Leg Pain   Vaginal Bleeding    HPI Erin Wilkins is a 28 y.o. [id number] at Unknown, Not found., who presents to the Maternity Assessment Unit for abdominal pain and VB  Patient reports spotting for several days. Her last normal LMP was 9/16. She has been bleeding some each month and had Upreg+ on 12/9. She was advised at that time to present to MAU since she was having abdominal pain, but did not.   She presents today d/t worsening abdominal/inguinal pain. She indicate LLQ/L inguinal pain that radiates to the anterior thigh proximal to the knee and posteriorly to the upper buttock. It is described as pinch and feels worse when walking.   ROS (+) vaginal spotting, inguinal vs abdominal pain (-) numb/tingle   Past Medical History:  Diagnosis Date   Anemia    Birth control counseling 01/18/2011   Hypothyroidism    Irregular uterine bleeding 04/24/2018   Lower abdominal pain 06/19/2021   Routine screening for STI (sexually transmitted infection) 06/09/2020   UTI (urinary tract infection)    Vaginal candidiasis 07/08/2019   Past Surgical History:  Procedure Laterality Date   NO PAST SURGERIES     Allergies[1]  Physical Exam  BP 118/66 (BP Location: Right Arm)   Pulse 85   Temp 98.2 F (36.8 C) (Oral)   Resp 18   Ht 5' 1 (1.549 m)   Wt 60 kg   LMP 01/24/2024   SpO2 100%   BMI 24.98 kg/m   Gen: alert, no acute distress CV: regular rate Resp: nonlabored Abd: nontender Extremities: antalgic gait without marching or foot drop  Labs --/--/O POS (12/29 2146)   Results for orders placed or performed during the hospital encounter of 05/04/24 (from the past 24 hours)  Urinalysis, Routine w reflex microscopic -Urine, Clean Catch     Status: Abnormal   Collection Time:  05/04/24  7:18 PM  Result Value Ref Range   Color, Urine YELLOW YELLOW   APPearance HAZY (A) CLEAR   Specific Gravity, Urine 1.021 1.005 - 1.030   pH 6.0 5.0 - 8.0   Glucose, UA NEGATIVE NEGATIVE mg/dL   Hgb urine dipstick NEGATIVE NEGATIVE   Bilirubin Urine NEGATIVE NEGATIVE   Ketones, ur NEGATIVE NEGATIVE mg/dL   Protein, ur NEGATIVE NEGATIVE mg/dL   Nitrite NEGATIVE NEGATIVE   Leukocytes,Ua NEGATIVE NEGATIVE  CBC     Status: Abnormal   Collection Time: 05/04/24  9:46 PM  Result Value Ref Range   WBC 9.7 4.0 - 10.5 K/uL   RBC 3.68 (L) 3.87 - 5.11 MIL/uL   Hemoglobin 11.6 (L) 12.0 - 15.0 g/dL   HCT 65.5 (L) 63.9 - 53.9 %   MCV 93.5 80.0 - 100.0 fL   MCH 31.5 26.0 - 34.0 pg   MCHC 33.7 30.0 - 36.0 g/dL   RDW 86.6 88.4 - 84.4 %   Platelets 253 150 - 400 K/uL   nRBC 0.0 0.0 - 0.2 %  Comprehensive metabolic panel with GFR     Status: Abnormal   Collection Time: 05/04/24  9:46 PM  Result Value Ref Range   Sodium 134 (L) 135 - 145 mmol/L   Potassium 4.1 3.5 - 5.1 mmol/L   Chloride 104 98 - 111 mmol/L   CO2 18 (  L) 22 - 32 mmol/L   Glucose, Bld 82 70 - 99 mg/dL   BUN 15 6 - 20 mg/dL   Creatinine, Ser 9.47 0.44 - 1.00 mg/dL   Calcium 8.7 (L) 8.9 - 10.3 mg/dL   Total Protein 6.9 6.5 - 8.1 g/dL   Albumin 3.7 3.5 - 5.0 g/dL   AST 14 (L) 15 - 41 U/L   ALT 6 0 - 44 U/L   Alkaline Phosphatase 53 38 - 126 U/L   Total Bilirubin 0.3 0.0 - 1.2 mg/dL   GFR, Estimated >39 >39 mL/min   Anion gap 13 5 - 15  hCG, quantitative, pregnancy     Status: Abnormal   Collection Time: 05/04/24  9:46 PM  Result Value Ref Range   hCG, Beta Chain, Quant, S 41,859 (H) <5 mIU/mL  ABO/Rh     Status: None   Collection Time: 05/04/24  9:46 PM  Result Value Ref Range   ABO/RH(D) O POS    No rh immune globuloin      NOT A RH IMMUNE GLOBULIN CANDIDATE, PT RH POSITIVE Performed at [facility], [address] [place], [place] [address]   Wet prep, genital     Status: Abnormal   Collection Time:  05/04/24 10:51 PM   Specimen: PATH Cytology Cervicovaginal Ancillary Only  Result Value Ref Range   Yeast Wet Prep HPF POC NONE SEEN NONE SEEN   Trich, Wet Prep NONE SEEN NONE SEEN   Clue Cells Wet Prep HPF POC PRESENT (A) NONE SEEN   WBC, Wet Prep HPF POC <10 <10   Sperm NONE SEEN     Imaging US  OB Comp Less 14 Wks with OB Transvaginal Preliminary report: GA 14+0, EDD 10/30/2024.   Assessment and Plan  MDM Erin Wilkins is a 28 y.o. [id number] at Unknown, Not found., who presents to the MAU for spotting and inguinal pain. Ddx: vaginitis, ectopic pregnancy, nephrolithiasis, inguinal hernia, round ligament pain, sciatica, lumbar radiculopathy.  Orders Placed This Encounter  Procedures   Wet prep, genital   US  MFM OB LIMITED   Urinalysis, Routine w reflex microscopic -Urine, Clean Catch   CBC   Comprehensive metabolic panel with GFR   hCG, quantitative, pregnancy   ABO/Rh   Discharge patient    Pt feeling surprised after finding out prelim report GA [redacted] weeks, c/w her LMP. Will establish care at [facility] where she also got care for recent pregnancy. Trial acetaminophen  and topicals at home for pain. Rx Metrogel  sent. Patient reports upset stomach reaction to po medication. Agreeable to vaginal applicators. Metronidazole  is listed in her allergies.   1. [redacted] weeks gestation of pregnancy (Primary) - Discharge patient  2. Vaginal bleeding in pregnancy, second trimester  3. Bacterial vaginosis in pregnancy - metroNIDAZOLE  (METROGEL ) 0.75 % gel; Apply 1 Application topically daily for 5 days. One full applicator (5 g) intravaginally, once a day for 5 days  Dispense: 5 g; Refill: 0  4. Short interval between pregnancies affecting pregnancy in second trimester, antepartum  Results pending at the time of DC: final read OB limited US , GC/CT Dispo: DC home in stable condition with return precautions discussed and included in AVS.    [name], DO [facility]     [1]  Allergies Allergen Reactions   Cherry Flavoring Agent (Non-Screening) Anaphylaxis   Keflex  [Cephalexin ] Hives   Metronidazole  Nausea And Vomiting    To PO form (while pregnant)   Peanut (Diagnostic)  Hives   Tylenol  [Acetaminophen ] Hives   Amoxicillin  Hives and Rash    Rxn about 1 yr ago   Ancef  [Cefazolin ] Rash    Perioral rash   Ibuprofen  Rash   Penicillins Hives and Rash    Has patient had a PCN reaction causing immediate rash, facial/tongue/throat swelling, SOB or lightheadedness with hypotension: No Has patient had a PCN reaction causing severe rash involving mucus membranes or skin necrosis: No Has patient had a PCN reaction that required hospitalization: No Has patient had a PCN reaction occurring within the last 10 years: Yes If all of the above answers are NO, then may proceed with Cephalosporin use.    "

## 2024-05-04 NOTE — Discharge Instructions (Signed)
 Safe Medications in Pregnancy  - Take medications as directed on the package. Medications are listed by Brand name, store brands are considered equivalent to brand name, just be sure that the medications are the same. Ex: Tylenol (acetaminophen) - If taking multiple medications, please check labels to avoid duplicating the same active ingredients - Do not exceed 4000 mg of Tylenol (acetaminophen) in 24 hours - Do not take medications that contain aspirin  or ibuprofen  (Motrin , Advil ) or naproxen (Aleve, Naprosyn)  Acne Benzoyl Peroxide Salicylic Acid  Allergies Benadryl  (diphenhydramine ) Claritin (loratadine) Flonase nasal spray (fluticasone) Saline nasal spray/drops  Backache/Headache Acetaminophen (Tylenol): 2 regular strength (325mg ) every 4 hours OR 2 extra strength (500mg ) every 6 hours  Colds/Coughs Breathe right strips Cepacol throat lozenges OR Chloraseptic throat spray cough drops, alcohol free Delsym (dextromethorphan, cough suppressant) Mucinex (guaifenesin, mucolytic/expectorant) Robitussin DM (dextromethorphan + guaifenesin) Saline nasal spray/drops Sudafed (pseudoephedrine, decongestant)  only after [redacted] weeks gestation and if you do not have high blood pressure Vicks Vaporub Zinc lozenges Zyrtec (cetirizine)  Constipation Immediate relief Ducolax suppositories (bisacodyl) Fleet enema (saline enema) glycerin suppositories milk of magnesia Senokot (overnight) Smooth move tea (overnight)  to keep you regular Colace, Dulcolax (docusate) Metamucil (psyllium fiber) Miralax (polyethylene glycol)   Diarrhea Kaopectate (bismuth subsalicylate) Imodium A-D (loperamide) *NO pepto Bismol Hemorrhoids Anusol   Anusol  HC (Rx only) Preparation H Tucks  Indigestion Tums Maalox Mylanta Pepcid  Insomnia Benadryl  (alcohol free) 25mg  every 6 hours as needed Tylenol PM Unisom, no Gelcaps  Leg  Cramps Tums MagGel  Nausea/Vomiting Bonine Dramamine Emetrol Ginger extract Sea bands Meclizine (Rx only)  Nausea medication to take during pregnancy Unisom (doxylamine succinate 25 mg tablets) Take one half tablet daily at bedtime. Vitamin B6 100mg  tablets. Take one tablet twice a day (up to 200 mg per day).  Skin Rashes: Aveeno products Benadryl  cream or 25mg  pill every 6 hours as needed Calamine Lotion 1% cortisone cream  Yeast infection: Gyne-lotrimin 7 Monistat 7

## 2024-05-04 NOTE — MAU Note (Signed)
 MAU Triage Note: Erin Wilkins is a 28 y.o. at Unknown here in MAU reporting: left lower abdominal pain that radiates down her leg and around to her back. The pain is worse with movement. Was seen by her doctor for the pain and abnormal bleeding and she instructed her to come her for further evaluation. Reports positive UPT at home and positive test on 04/14/24. No longer having vaginal bleeding, but does report scant brown discharge.   Patient complaint: Lft Side Pn, radiating to leg  Pain Score: 5  Pain Location: Abdomen     Onset of complaint: on-going LMP: Patient's last menstrual period was 01/24/2024.   Vitals:   05/04/24 1928  BP: 118/66  Pulse: 85  Resp: 18  Temp: 98.2 F (36.8 C)  SpO2: 100%    FHT:   deferred Lab orders placed from triage: UA

## 2024-05-05 LAB — GC/CHLAMYDIA PROBE AMP (~~LOC~~) NOT AT ARMC
Chlamydia: NEGATIVE
Comment: NEGATIVE
Comment: NORMAL
Neisseria Gonorrhea: NEGATIVE
